# Patient Record
Sex: Female | Born: 1975 | Race: White | Hispanic: No
Health system: Southern US, Academic
[De-identification: ages and names within clinical notes are randomized; demographics above are authoritative.]

## PROBLEM LIST (undated history)

## (undated) ENCOUNTER — Ambulatory Visit: Payer: MEDICARE

## (undated) ENCOUNTER — Encounter

## (undated) ENCOUNTER — Telehealth

## (undated) ENCOUNTER — Ambulatory Visit

## (undated) ENCOUNTER — Encounter: Attending: Hematology & Oncology | Primary: Hematology & Oncology

## (undated) ENCOUNTER — Encounter: Attending: Geriatric Medicine | Primary: Geriatric Medicine

## (undated) ENCOUNTER — Telehealth: Attending: Hematology & Oncology | Primary: Hematology & Oncology

## (undated) ENCOUNTER — Non-Acute Institutional Stay: Payer: MEDICARE

## (undated) ENCOUNTER — Encounter: Attending: Clinical | Primary: Clinical

## (undated) ENCOUNTER — Encounter: Payer: MEDICARE | Attending: Clinical | Primary: Clinical

## (undated) ENCOUNTER — Ambulatory Visit: Payer: MEDICARE | Attending: Family | Primary: Family

## (undated) ENCOUNTER — Ambulatory Visit: Payer: MEDICARE | Attending: Hematology & Oncology | Primary: Hematology & Oncology

## (undated) ENCOUNTER — Encounter: Payer: MEDICARE | Attending: Hematology & Oncology | Primary: Hematology & Oncology

## (undated) DIAGNOSIS — C50912 Malignant neoplasm of unspecified site of left female breast: Secondary | ICD-10-CM

## (undated) DIAGNOSIS — N870 Mild cervical dysplasia: Secondary | ICD-10-CM

## (undated) DIAGNOSIS — C50919 Malignant neoplasm of unspecified site of unspecified female breast: Secondary | ICD-10-CM

## (undated) DIAGNOSIS — F430 Acute stress reaction: Secondary | ICD-10-CM

## (undated) DIAGNOSIS — R519 Headache, unspecified: Secondary | ICD-10-CM

## (undated) DIAGNOSIS — C801 Malignant (primary) neoplasm, unspecified: Secondary | ICD-10-CM

## (undated) DIAGNOSIS — B360 Pityriasis versicolor: Secondary | ICD-10-CM

## (undated) DIAGNOSIS — R51 Headache: Secondary | ICD-10-CM

## (undated) DIAGNOSIS — F410 Panic disorder [episodic paroxysmal anxiety] without agoraphobia: Secondary | ICD-10-CM

## (undated) DIAGNOSIS — R404 Transient alteration of awareness: Secondary | ICD-10-CM

## (undated) DIAGNOSIS — K37 Unspecified appendicitis: Secondary | ICD-10-CM

## (undated) DIAGNOSIS — F32A Depression, unspecified: Secondary | ICD-10-CM

## (undated) DIAGNOSIS — N390 Urinary tract infection, site not specified: Secondary | ICD-10-CM

## (undated) DIAGNOSIS — Z8614 Personal history of Methicillin resistant Staphylococcus aureus infection: Secondary | ICD-10-CM

## (undated) DIAGNOSIS — R7302 Impaired glucose tolerance (oral): Secondary | ICD-10-CM

## (undated) DIAGNOSIS — F329 Major depressive disorder, single episode, unspecified: Secondary | ICD-10-CM

## (undated) DIAGNOSIS — R110 Nausea: Secondary | ICD-10-CM

## (undated) DIAGNOSIS — M545 Low back pain: Secondary | ICD-10-CM

## (undated) DIAGNOSIS — N63 Unspecified lump in unspecified breast: Secondary | ICD-10-CM

## (undated) DIAGNOSIS — K3589 Other acute appendicitis without perforation or gangrene: Secondary | ICD-10-CM

## (undated) DIAGNOSIS — K353 Acute appendicitis with localized peritonitis, without perforation or gangrene: Secondary | ICD-10-CM

## (undated) DIAGNOSIS — Z8742 Personal history of other diseases of the female genital tract: Secondary | ICD-10-CM

## (undated) DIAGNOSIS — R55 Syncope and collapse: Secondary | ICD-10-CM

## (undated) DIAGNOSIS — R001 Bradycardia, unspecified: Secondary | ICD-10-CM

## (undated) DIAGNOSIS — K7581 Nonalcoholic steatohepatitis (NASH): Secondary | ICD-10-CM

## (undated) DIAGNOSIS — G8929 Other chronic pain: Secondary | ICD-10-CM

## (undated) DIAGNOSIS — F332 Major depressive disorder, recurrent severe without psychotic features: Secondary | ICD-10-CM

## (undated) DIAGNOSIS — F3341 Major depressive disorder, recurrent, in partial remission: Secondary | ICD-10-CM

## (undated) DIAGNOSIS — E282 Polycystic ovarian syndrome: Secondary | ICD-10-CM

## (undated) HISTORY — DX: Malignant (primary) neoplasm, unspecified: C80.1

## (undated) HISTORY — DX: Other acute appendicitis without perforation or gangrene: K35.890

## (undated) HISTORY — DX: Personal history of other diseases of the female genital tract: Z87.42

## (undated) HISTORY — DX: Malignant neoplasm of unspecified site of unspecified female breast: C50.919

## (undated) HISTORY — DX: Major depressive disorder, recurrent, in partial remission: F33.41

## (undated) HISTORY — DX: Unspecified appendicitis: K37

## (undated) HISTORY — DX: Other chronic pain: G89.29

## (undated) HISTORY — DX: Mild cervical dysplasia: N87.0

## (undated) HISTORY — DX: Transient alteration of awareness: R40.4

## (undated) HISTORY — DX: Nonalcoholic steatohepatitis (NASH): K75.81

## (undated) HISTORY — DX: Polycystic ovarian syndrome: E28.2

## (undated) HISTORY — DX: Urinary tract infection, site not specified: N39.0

## (undated) HISTORY — DX: Headache: R51

## (undated) HISTORY — DX: Major depressive disorder, recurrent severe without psychotic features: F33.2

## (undated) HISTORY — DX: Impaired glucose tolerance (oral): R73.02

## (undated) HISTORY — DX: Unspecified lump in unspecified breast: N63.0

## (undated) HISTORY — DX: Syncope and collapse: R55

## (undated) HISTORY — DX: Depression, unspecified: F32.A

## (undated) HISTORY — DX: Nausea: R11.0

## (undated) HISTORY — DX: Bradycardia, unspecified: R00.1

## (undated) HISTORY — DX: Panic disorder (episodic paroxysmal anxiety): F41.0

## (undated) HISTORY — DX: Acute stress reaction: F43.0

## (undated) HISTORY — DX: Pityriasis versicolor: B36.0

## (undated) HISTORY — DX: Low back pain: M54.5

## (undated) HISTORY — DX: Acute appendicitis with localized peritonitis, without perforation or gangrene: K35.30

## (undated) HISTORY — DX: Major depressive disorder, single episode, unspecified: F32.9

## (undated) HISTORY — DX: Personal history of Methicillin resistant Staphylococcus aureus infection: Z86.14

## (undated) HISTORY — DX: Headache, unspecified: R51.9

## (undated) HISTORY — DX: Malignant neoplasm of unspecified site of left female breast: C50.912

## (undated) HISTORY — PX: ABDOMINAL HYSTERECTOMY: SHX81

---
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
## 2005-02-22 ENCOUNTER — Emergency Department: Payer: Self-pay | Admitting: Emergency Medicine

## 2005-05-17 ENCOUNTER — Emergency Department: Payer: Self-pay | Admitting: Emergency Medicine

## 2005-07-10 ENCOUNTER — Emergency Department: Payer: Self-pay | Admitting: Unknown Physician Specialty

## 2005-08-10 ENCOUNTER — Emergency Department: Payer: Self-pay | Admitting: Emergency Medicine

## 2005-10-12 ENCOUNTER — Ambulatory Visit: Payer: Self-pay | Admitting: Obstetrics & Gynecology

## 2005-11-01 ENCOUNTER — Ambulatory Visit: Payer: Self-pay | Admitting: Obstetrics & Gynecology

## 2005-11-04 ENCOUNTER — Ambulatory Visit: Payer: Self-pay | Admitting: Obstetrics & Gynecology

## 2005-11-23 ENCOUNTER — Observation Stay: Payer: Self-pay

## 2005-12-05 ENCOUNTER — Ambulatory Visit: Payer: Self-pay | Admitting: Obstetrics & Gynecology

## 2006-01-01 ENCOUNTER — Emergency Department: Payer: Self-pay | Admitting: Emergency Medicine

## 2006-02-04 ENCOUNTER — Observation Stay: Payer: Self-pay | Admitting: Unknown Physician Specialty

## 2006-02-04 ENCOUNTER — Observation Stay: Payer: Self-pay

## 2006-02-23 ENCOUNTER — Observation Stay: Payer: Self-pay | Admitting: Obstetrics & Gynecology

## 2006-03-06 ENCOUNTER — Observation Stay: Payer: Self-pay

## 2006-03-10 ENCOUNTER — Observation Stay: Payer: Self-pay

## 2006-03-13 ENCOUNTER — Inpatient Hospital Stay: Payer: Self-pay

## 2006-08-08 ENCOUNTER — Emergency Department: Payer: Self-pay | Admitting: Emergency Medicine

## 2006-10-15 ENCOUNTER — Emergency Department: Payer: Self-pay | Admitting: Emergency Medicine

## 2006-10-16 ENCOUNTER — Emergency Department: Payer: Self-pay | Admitting: Internal Medicine

## 2006-10-17 ENCOUNTER — Emergency Department: Payer: Self-pay | Admitting: Internal Medicine

## 2008-03-23 ENCOUNTER — Emergency Department (HOSPITAL_COMMUNITY): Admission: EM | Admit: 2008-03-23 | Discharge: 2008-03-23 | Payer: Self-pay | Admitting: Emergency Medicine

## 2008-08-10 ENCOUNTER — Emergency Department (HOSPITAL_COMMUNITY): Admission: EM | Admit: 2008-08-10 | Discharge: 2008-08-11 | Payer: Self-pay | Admitting: Emergency Medicine

## 2008-10-06 ENCOUNTER — Emergency Department (HOSPITAL_COMMUNITY): Admission: EM | Admit: 2008-10-06 | Discharge: 2008-10-06 | Payer: Self-pay | Admitting: Emergency Medicine

## 2008-11-05 ENCOUNTER — Encounter: Admission: RE | Admit: 2008-11-05 | Discharge: 2008-11-05 | Payer: Self-pay | Admitting: Family Medicine

## 2008-11-16 ENCOUNTER — Emergency Department (HOSPITAL_COMMUNITY): Admission: EM | Admit: 2008-11-16 | Discharge: 2008-11-16 | Payer: Self-pay | Admitting: Emergency Medicine

## 2009-02-06 ENCOUNTER — Emergency Department (HOSPITAL_COMMUNITY): Admission: EM | Admit: 2009-02-06 | Discharge: 2009-02-07 | Payer: Self-pay | Admitting: Emergency Medicine

## 2009-02-08 ENCOUNTER — Emergency Department (HOSPITAL_COMMUNITY): Admission: EM | Admit: 2009-02-08 | Discharge: 2009-02-08 | Payer: Self-pay | Admitting: Emergency Medicine

## 2009-06-11 ENCOUNTER — Emergency Department (HOSPITAL_COMMUNITY): Admission: EM | Admit: 2009-06-11 | Discharge: 2009-06-11 | Payer: Self-pay | Admitting: Emergency Medicine

## 2010-11-12 ENCOUNTER — Ambulatory Visit: Payer: Self-pay | Admitting: Obstetrics and Gynecology

## 2010-11-16 ENCOUNTER — Ambulatory Visit: Payer: Self-pay | Admitting: Obstetrics and Gynecology

## 2011-03-17 LAB — URINALYSIS, ROUTINE W REFLEX MICROSCOPIC
Glucose, UA: NEGATIVE mg/dL
Ketones, ur: NEGATIVE mg/dL
Nitrite: NEGATIVE
Specific Gravity, Urine: 1.016 (ref 1.005–1.030)
pH: 6 (ref 5.0–8.0)

## 2011-03-17 LAB — BASIC METABOLIC PANEL
BUN: 6 mg/dL (ref 6–23)
Calcium: 8.2 mg/dL — ABNORMAL LOW (ref 8.4–10.5)
GFR calc non Af Amer: >60 mL/min (ref >60)
Potassium: 3.3 mEq/L — ABNORMAL LOW (ref 3.5–5.1)
Sodium: 138 mEq/L (ref 135–145)

## 2011-03-17 LAB — POCT PREGNANCY, URINE
Preg Test, Ur: NEGATIVE
Preg Test, Ur: NEGATIVE

## 2011-03-17 LAB — DIFFERENTIAL
Basophils Absolute: 0 10*3/uL (ref 0.0–0.1)
Eosinophils Relative: 8 % — ABNORMAL HIGH (ref 0–5)
Lymphocytes Relative: 37 % (ref 12–46)
Lymphs Abs: 1.7 10*3/uL (ref 0.7–4.0)
Neutro Abs: 2.1 10*3/uL (ref 1.7–7.7)

## 2011-03-17 LAB — CBC
HCT: 40.3 % (ref 36.0–46.0)
Hemoglobin: 14.1 g/dL (ref 12.0–15.0)
Platelets: 164 10*3/uL (ref 150–400)
RDW: 13.5 % (ref 11.5–15.5)
WBC: 4.6 10*3/uL (ref 4.0–10.5)

## 2011-08-30 LAB — POCT PREGNANCY, URINE
Operator id: 294511
Preg Test, Ur: NEGATIVE

## 2011-09-07 LAB — CBC
HCT: 40.4
Platelets: 219
RDW: 13.6
WBC: 9.4

## 2011-09-07 LAB — DIFFERENTIAL
Basophils Absolute: 0.1
Eosinophils Relative: 6 — ABNORMAL HIGH
Lymphocytes Relative: 38
Lymphs Abs: 3.5
Neutro Abs: 4.5
Neutrophils Relative %: 48

## 2011-09-07 LAB — BASIC METABOLIC PANEL
BUN: 8
Calcium: 8.8
Creatinine, Ser: 0.57
GFR calc non Af Amer: >60
Glucose, Bld: 92

## 2011-09-07 LAB — POCT I-STAT, CHEM 8
BUN: 8
Hemoglobin: 13.3
Potassium: 3.6
Sodium: 138
TCO2: 26

## 2011-09-09 LAB — HEPATIC FUNCTION PANEL
ALT: 46 U/L — ABNORMAL HIGH (ref 0–35)
AST: 33 U/L (ref 0–37)
Alkaline Phosphatase: 77 U/L (ref 39–117)
Bilirubin, Direct: 0.2 mg/dL (ref 0.0–0.3)
Total Bilirubin: 0.7 mg/dL (ref 0.3–1.2)

## 2011-09-09 LAB — BASIC METABOLIC PANEL
CO2: 24 mEq/L (ref 19–32)
Calcium: 8.6 mg/dL (ref 8.4–10.5)
GFR calc Af Amer: >60 mL/min (ref >60)
GFR calc non Af Amer: >60 mL/min (ref >60)
Potassium: 3.5 mEq/L (ref 3.5–5.1)
Sodium: 139 mEq/L (ref 135–145)

## 2011-09-09 LAB — URINALYSIS, ROUTINE W REFLEX MICROSCOPIC
Glucose, UA: NEGATIVE mg/dL
Hgb urine dipstick: NEGATIVE
Specific Gravity, Urine: 1.007 (ref 1.005–1.030)
Urobilinogen, UA: 0.2 mg/dL (ref 0.0–1.0)
pH: 6 (ref 5.0–8.0)

## 2011-09-09 LAB — DIFFERENTIAL
Lymphocytes Relative: 40 % (ref 12–46)
Monocytes Absolute: 0.6 10*3/uL (ref 0.1–1.0)
Monocytes Relative: 7 % (ref 3–12)
Neutro Abs: 4 10*3/uL (ref 1.7–7.7)

## 2011-09-09 LAB — PREGNANCY, URINE: Preg Test, Ur: NEGATIVE

## 2011-09-09 LAB — CBC
HCT: 39.3 % (ref 36.0–46.0)
Hemoglobin: 13.3 g/dL (ref 12.0–15.0)
RBC: 4.36 MIL/uL (ref 3.87–5.11)

## 2011-09-09 LAB — URINE MICROSCOPIC-ADD ON

## 2012-05-03 ENCOUNTER — Ambulatory Visit: Payer: Self-pay | Admitting: Family Medicine

## 2012-12-04 ENCOUNTER — Emergency Department: Payer: Self-pay | Admitting: Emergency Medicine

## 2012-12-04 LAB — BASIC METABOLIC PANEL
Calcium, Total: 8.9 mg/dL (ref 8.5–10.1)
Chloride: 107 mmol/L (ref 98–107)
Co2: 27 mmol/L (ref 21–32)
Creatinine: 0.7 mg/dL (ref 0.60–1.30)
Sodium: 140 mmol/L (ref 136–145)

## 2012-12-04 LAB — CBC
HCT: 41.3 % (ref 35.0–47.0)
MCHC: 34.3 g/dL (ref 32.0–36.0)
MCV: 91 fL (ref 80–100)
RDW: 13 % (ref 11.5–14.5)

## 2012-12-04 LAB — CK TOTAL AND CKMB (NOT AT ARMC): CK, Total: 37 U/L (ref 21–215)

## 2013-11-25 DIAGNOSIS — N63 Unspecified lump in unspecified breast: Secondary | ICD-10-CM | POA: Insufficient documentation

## 2013-11-25 HISTORY — DX: Unspecified lump in unspecified breast: N63.0

## 2013-12-11 DIAGNOSIS — C50919 Malignant neoplasm of unspecified site of unspecified female breast: Secondary | ICD-10-CM

## 2013-12-11 HISTORY — DX: Malignant neoplasm of unspecified site of unspecified female breast: C50.919

## 2013-12-24 ENCOUNTER — Emergency Department: Payer: Self-pay | Admitting: Psychiatry

## 2013-12-24 LAB — COMPREHENSIVE METABOLIC PANEL
Albumin: 4.4 g/dL (ref 3.4–5.0)
Alkaline Phosphatase: 64 U/L
Anion Gap: 10 (ref 7–16)
BILIRUBIN TOTAL: 0.9 mg/dL (ref 0.2–1.0)
BUN: 10 mg/dL (ref 7–18)
Calcium, Total: 9.5 mg/dL (ref 8.5–10.1)
Chloride: 106 mmol/L (ref 98–107)
Co2: 23 mmol/L (ref 21–32)
Creatinine: 0.79 mg/dL (ref 0.60–1.30)
EGFR (African American): >60
EGFR (Non-African Amer.): >60
Glucose: 121 mg/dL — ABNORMAL HIGH (ref 65–99)
Osmolality: 278 (ref 275–301)
Potassium: 4.6 mmol/L (ref 3.5–5.1)
SGOT(AST): 46 U/L — ABNORMAL HIGH (ref 15–37)
SGPT (ALT): 33 U/L (ref 12–78)
Sodium: 139 mmol/L (ref 136–145)
Total Protein: 7.7 g/dL (ref 6.4–8.2)

## 2013-12-24 LAB — URINALYSIS, COMPLETE
BILIRUBIN, UR: NEGATIVE
Bacteria: NONE SEEN
Glucose,UR: NEGATIVE mg/dL (ref 0–75)
Leukocyte Esterase: NEGATIVE
Nitrite: NEGATIVE
PH: 9 (ref 4.5–8.0)
Protein: NEGATIVE
SPECIFIC GRAVITY: 1.009 (ref 1.003–1.030)
Squamous Epithelial: 1
WBC UR: 2 /HPF (ref 0–5)

## 2013-12-24 LAB — LIPASE, BLOOD: LIPASE: 90 U/L (ref 73–393)

## 2013-12-24 LAB — CBC
HCT: 43.8 % (ref 35.0–47.0)
HGB: 14.8 g/dL (ref 12.0–16.0)
MCH: 30.8 pg (ref 26.0–34.0)
MCHC: 33.8 g/dL (ref 32.0–36.0)
MCV: 91 fL (ref 80–100)
Platelet: 200 10*3/uL (ref 150–440)
RBC: 4.82 10*6/uL (ref 3.80–5.20)
RDW: 13.2 % (ref 11.5–14.5)
WBC: 12.1 10*3/uL — ABNORMAL HIGH (ref 3.6–11.0)

## 2013-12-31 HISTORY — PX: MASTECTOMY: SHX3

## 2014-03-10 ENCOUNTER — Inpatient Hospital Stay: Payer: Self-pay | Admitting: Internal Medicine

## 2014-03-10 LAB — CBC WITH DIFFERENTIAL/PLATELET
Bands: 6 %
COMMENT - H1-COM1: NORMAL
Comment - H1-Com2: NORMAL
EOS PCT: 4 %
HCT: 37.1 % (ref 35.0–47.0)
HGB: 12.7 g/dL (ref 12.0–16.0)
LYMPHS PCT: 11 %
MCH: 31 pg (ref 26.0–34.0)
MCHC: 34.3 g/dL (ref 32.0–36.0)
MCV: 90 fL (ref 80–100)
Monocytes: 8 %
Platelet: 132 10*3/uL — ABNORMAL LOW (ref 150–440)
RBC: 4.1 10*6/uL (ref 3.80–5.20)
RDW: 13.4 % (ref 11.5–14.5)
SEGMENTED NEUTROPHILS: 71 %
WBC: 12.8 10*3/uL — ABNORMAL HIGH (ref 3.6–11.0)

## 2014-03-10 LAB — COMPREHENSIVE METABOLIC PANEL
ALK PHOS: 74 U/L
ALT: 45 U/L (ref 12–78)
ANION GAP: 9 (ref 7–16)
Albumin: 3.4 g/dL (ref 3.4–5.0)
BUN: 16 mg/dL (ref 7–18)
Bilirubin,Total: 0.6 mg/dL (ref 0.2–1.0)
CALCIUM: 8.4 mg/dL — AB (ref 8.5–10.1)
CHLORIDE: 105 mmol/L (ref 98–107)
CO2: 25 mmol/L (ref 21–32)
Creatinine: 0.69 mg/dL (ref 0.60–1.30)
EGFR (African American): >60
EGFR (Non-African Amer.): >60
GLUCOSE: 82 mg/dL (ref 65–99)
Osmolality: 278 (ref 275–301)
POTASSIUM: 3.1 mmol/L — AB (ref 3.5–5.1)
SGOT(AST): 15 U/L (ref 15–37)
Sodium: 139 mmol/L (ref 136–145)
Total Protein: 6.2 g/dL — ABNORMAL LOW (ref 6.4–8.2)

## 2014-03-10 LAB — LIPASE, BLOOD: LIPASE: 99 U/L (ref 73–393)

## 2014-03-11 LAB — BASIC METABOLIC PANEL
ANION GAP: 6 — AB (ref 7–16)
BUN: 16 mg/dL (ref 7–18)
CALCIUM: 8 mg/dL — AB (ref 8.5–10.1)
CO2: 25 mmol/L (ref 21–32)
Chloride: 108 mmol/L — ABNORMAL HIGH (ref 98–107)
Creatinine: 0.63 mg/dL (ref 0.60–1.30)
EGFR (Non-African Amer.): >60
GLUCOSE: 123 mg/dL — AB (ref 65–99)
Osmolality: 280 (ref 275–301)
Potassium: 3.6 mmol/L (ref 3.5–5.1)
Sodium: 139 mmol/L (ref 136–145)

## 2015-03-28 NOTE — Discharge Summary (Signed)
PATIENT NAME:  Kimberly Key, DEWBERRY MR#:  [id number] DATE OF BIRTH:  22-Jun-1976  DATE OF ADMISSION:  03/10/2014 DATE OF DISCHARGE:  03/13/2014  PRESENTING COMPLAINT: Nausea and vomiting.  PRIMARY CARE PHYSICIAN: Dr. Lelon Huh   DISCHARGE DIAGNOSES:  1.  Intractable nausea and vomiting, improved, secondary to constipation. 2.  History of breast cancer, status post chemotherapy.   CODE STATUS: FULL code.   DISCHARGE MEDICATIONS: 1.  Compazine 10 mg 3 times a day. 2.  Tramadol 50 mg every 4 hours as needed.  3.  Lorazepam 1 mg t.i.d.  4.  Docusate 100 mg 2 capsules b.i.d. 5.  MiraLax 17 grams orally daily as needed. 6.  Metoclopramide 10 mg 1 tablet 4 times a day.  7.  Phenadoz 12.5 mg rectal suppository as needed.   DISCHARGE DIET: Regular, liquid, advance as tolerated.   DISCHARGE FOLLOWUP: Follow up with Dr. Lonia Chimera, oncology, on your scheduled appointment. Follow up with Dr. Caryn Section as outpatient as well.   BRIEF SUMMARY OF HOSPITAL COURSE: [name] is a 39 year old Caucasian female with history of cervical, uterine, and breast cancer who came to the hospital due to intractable nausea, vomiting, and feeling restless. She currently is getting treatment for breast cancer actively. She was admitted with:  1.  Intractable nausea and vomiting which was likely related to constipation. She had chemotherapy about 4 to 5 days ago. It could be due to that, however, in discussing with her oncologist, Dr. Lonia Chimera, it was less likely. The patient did have significant constipation noted on KUB. She was given some enemas, started on Reglan, including Compazine and Decadron was also given for vomiting. She was tolerating full liquid diet prior to discharge. X-ray KUB did not show any obstruction but did show some constipation.  2.  Hyperkalemia, resolved.  3.  Leukocytosis, likely secondary to stress margination from nausea and vomiting. 4.  Constipation, improving. The patient did have several  BMs prior to discharge.   Hospital stay otherwise remained stable. She will follow up with her primary oncologist on her scheduled appointment.  TIME SPENT: 40 minutes. ____________________________ Hart Rochester Posey Pronto, MD sap:sb D: 03/18/2014 11:34:10 ET T: 03/18/2014 12:09:33 ET JOB#: [id number]  cc: Javari Bufkin A. Posey Pronto, MD, <Dictator> Kirstie Peri. Caryn Section, MD Ilda Basset MD ELECTRONICALLY SIGNED 03/31/2014 9:06

## 2015-03-28 NOTE — H&P (Signed)
PATIENT NAME:  Kimberly Key, GROUNDS MR#:  [id number] DATE OF BIRTH:  1975/12/29  DATE OF ADMISSION:  03/10/2014  PRIMARY CARE PHYSICIAN:  Dr. Lelon Huh.   ONCOLOGIST: At [facility]   CHIEF COMPLAINT: Nausea, vomiting.   HISTORY OF PRESENT ILLNESS: This is a 39 year old female who presented to the Emergency Room due to intractable nausea and vomiting that began this morning. The patient says that she received chemo for her breast cancer last Thursday. She was doing well until earlier this morning at 4:00; she started having multiple episodes of nausea and vomiting. She attempted to take her Ativan, and also take some Zofran, but it did not improve her symptoms. She, therefore, came to the ER for further evaluation. In the Emergency Room, the patient received IV Ativan, multiple doses of IV Zofran and Phenergan, but continues to have significant nausea and vomiting. Hospitalist services were contacted for further treatment and evaluation.   REVIEW OF SYSTEMS:  CONSTITUTIONAL: No documented fever. No weight gain. No weight loss.  EYES: No blurred or double vision.  ENT: No tinnitus. No postnasal drip. No redness of the oropharynx.  RESPIRATORY: No cough. No wheeze. No hemoptysis. No dyspnea.  CARDIOVASCULAR: No chest pain. No orthopnea. No palpitations. No syncope.  GASTROINTESTINAL: Positive nausea. Positive vomiting. No diarrhea. No abdominal pain. No melena or hematochezia.  GENITOURINARY: No dysuria or hematuria.  ENDOCRINE: No polyuria or nocturia. No heat or cold intolerance.  HEMATOLOGIC: No anemia. No bruising. No bleeding.  INTEGUMENTARY: No rashes. No lesions.  MUSCULOSKELETAL: No arthritis. No swelling. No gout.  NEUROLOGIC: No numbness. No tingling. No ataxia. No seizure-type activity. PSYCHIATRIC: No anxiety. No insomnia. No ADD.   PAST MEDICAL HISTORY: Consistent with history of cervical cancer, uterine cancer and also breast cancer.   ALLERGIES: PENICILLIN AND PERCOCET. PENICILLIN  CAUSES RASH. PERCOCET CAUSES MENTAL STATUS CHANGES.   SOCIAL HISTORY: Does not smoke. No alcohol abuse. No illicit drug abuse. Lives by herself. She is separated.   FAMILY HISTORY: Mother and father both alive. There is a family history of diabetes and high blood pressure in the family.   CURRENT MEDICATIONS: Are as follows: Ativan 1 mg t.i.d., prochlorperazine 10 mg t.i.d. and tramadol 50 mg q.4 hours.   PHYSICAL EXAMINATION: Presently is as follows:  VITAL SIGNS:  Are noted to be temperature is 98.5, pulse 75, respirations 20, blood pressure 126/68, stats 98% on room air.  GENERAL: She is a anxious/agitated-appearing female in mild distress.  HEAD, EYES, EARS, NOSE, THROAT:  She is atraumatic, normocephalic. Extraocular muscles are intact. Pupils are equal and reactive to light. Sclerae anicteric. No conjunctival injection. No pharyngeal erythema.  NECK: Supple. There is no jugular venous distention. No bruits. No lymphadenopathy or thyromegaly.  HEART: Regular rate and rhythm. No murmurs. No rubs. No clicks.  LUNGS: Clear to auscultation bilaterally. No rales. No rhonchi. No wheezes.  ABDOMEN: Soft, flat, nontender, nondistended. Has good bowel sounds. No hepatosplenomegaly appreciated.  EXTREMITIES: No evidence of any cyanosis, clubbing or peripheral edema. Has +2 pedal and radial pulses bilaterally.  NEUROLOGIC: The patient is alert, awake and oriented x 3 with no focal motor or sensory deficits bilaterally.  SKIN: Moist and warm with no rashes appreciated.  LYMPHATIC: There is no cervical or axillary lymphadenopathy.   LABORATORY DATA: Showed a serum glucose of 82, BUN 16, creatinine 0.6, sodium 139, potassium 3.1, chloride 105, bicarb 25. LFTs within normal limits. White cell count 12.8, hemoglobin 12.7, hematocrit 37.1, platelet count of 132.  The patient did have a KUB done which showed diffuse stool throughout the colon. Bowel gas pattern overall unremarkable.   ASSESSMENT AND  PLAN: This is a 39 year old female with history of cervical cancer, uterine and breast cancer, presented to the hospital due to nausea, vomiting and feeling restless.  1.  Intractable nausea, vomiting. This is likely related to the chemotherapy she received last week. For now, admit her under observation, start her on IV fluids and supportive care with scheduled IV Zofran and start her on some IV Decadron. Keep her n.p.o. for now.  2.  Hypokalemia, likely due to the intractable nausea, vomiting, I will replace her potassium and follow up in the morning.  3.  Leukocytosis; suspect this is stress margination from her nausea, vomiting. We will follow her white cell count.  4.  Constipation. The patient's KUB shows a moderate amount of stool throughout the colon. For now, I will place her on lactulose b.i.d.   The patient is a FULL CODE.   TIME SPENT: 50 minutes.    ____________________________ Belia Heman. Verdell Carmine, MD vjs:dmm D: 03/10/2014 11:20:31 ET T: 03/10/2014 11:45:20 ET JOB#: [id number]  cc: Belia Heman. Verdell Carmine, MD, <Dictator> Henreitta Leber MD ELECTRONICALLY SIGNED 03/16/2014 18:48

## 2015-04-08 LAB — BASIC METABOLIC PANEL
BUN: 12 mg/dL (ref 4–21)
CREATININE: 0.6 mg/dL (ref 0.5–1.1)
GLUCOSE: 98 mg/dL
POTASSIUM: 4.6 mmol/L (ref 3.4–5.3)
SODIUM: 144 mmol/L (ref 137–147)

## 2015-04-08 LAB — LIPID PANEL
Cholesterol: 200 mg/dL (ref 0–200)
HDL: 47 mg/dL (ref 35–70)
LDL Cholesterol: 128 mg/dL
Triglycerides: 125 mg/dL (ref 40–160)

## 2015-05-07 DIAGNOSIS — F332 Major depressive disorder, recurrent severe without psychotic features: Secondary | ICD-10-CM | POA: Insufficient documentation

## 2015-05-07 DIAGNOSIS — F3341 Major depressive disorder, recurrent, in partial remission: Secondary | ICD-10-CM | POA: Insufficient documentation

## 2015-05-07 HISTORY — DX: Major depressive disorder, recurrent severe without psychotic features: F33.2

## 2015-05-07 HISTORY — DX: Major depressive disorder, recurrent, in partial remission: F33.41

## 2015-08-14 ENCOUNTER — Encounter: Payer: Self-pay | Admitting: Family Medicine

## 2015-08-14 ENCOUNTER — Ambulatory Visit (INDEPENDENT_AMBULATORY_CARE_PROVIDER_SITE_OTHER): Payer: Medicaid Other | Admitting: Family Medicine

## 2015-08-14 VITALS — BP 118/70 | HR 80 | Temp 98.6°F | Resp 20 | Ht 62.0 in | Wt 191.0 lb

## 2015-08-14 DIAGNOSIS — N3000 Acute cystitis without hematuria: Secondary | ICD-10-CM | POA: Diagnosis not present

## 2015-08-14 DIAGNOSIS — N390 Urinary tract infection, site not specified: Secondary | ICD-10-CM | POA: Insufficient documentation

## 2015-08-14 HISTORY — DX: Urinary tract infection, site not specified: N39.0

## 2015-08-14 MED ORDER — CIPROFLOXACIN HCL 500 MG PO TABS
500.0000 mg | ORAL_TABLET | Freq: Two times a day (BID) | ORAL | Status: AC
Start: 2015-08-14 — End: 2015-08-21

## 2015-08-14 MED ORDER — PHENAZOPYRIDINE HCL 100 MG PO TABS: 100.0000 mg | ORAL_TABLET | Freq: Three times a day (TID) | ORAL | Status: DC | PRN

## 2015-08-14 NOTE — Progress Notes (Signed)
Patient ID: Kimberly Key, female   DOB: [birth year], 39 y.o.   MRN: [id number]   Kimberly Key  MRN: [id number] DOB: Aug 25, 1976  Subjective:  Abdominal Pain This is a new problem. The current episode started in the past 7 days. The onset quality is sudden. The problem occurs constantly. The problem has been gradually worsening. The pain is located in the generalized abdominal region. The pain is at a severity of 8/10. The pain is moderate. The quality of the pain is aching, a sensation of fullness and cramping. Associated symptoms include dysuria, frequency and nausea. Pertinent negatives include no constipation, diarrhea, fever, hematuria or vomiting. She has tried acetaminophen for the symptoms. The treatment provided no relief.  Patient reports that she has lower abdominal pain that has been constant and worsening. Patient believes that it could be a UTI.   Patient Active Problem List   Diagnosis Date Noted  . Major depressive disorder, recurrent severe without psychotic features 05/07/2015  . Breast CA 12/11/2013  . Breast lump 11/25/2013    No past medical history on file.  Social History   Social History  . Marital Status: Legally Separated    Spouse Name: N/A  . Number of Children: N/A  . Years of Education: N/A   Occupational History  . Not on file.   Social History Main Topics  . Smoking status: Never Smoker   . Smokeless tobacco: Not on file  . Alcohol Use: No  . Drug Use: No  . Sexual Activity: Not on file   Other Topics Concern  . Not on file   Social History Narrative  . No narrative on file    No outpatient prescriptions prior to visit.   No facility-administered medications prior to visit.    Allergies  Allergen Reactions  . Latex Rash  . Oxycodone-Acetaminophen Anxiety and Rash    Dizzy;"mentally not right"; did not ease pain  . Penicillins Rash    Review of Systems  Constitutional: Negative for fever.  Gastrointestinal: Positive for nausea  and abdominal pain. Negative for vomiting, diarrhea and constipation.  Genitourinary: Positive for dysuria and frequency. Negative for hematuria.   Objective:  BP 118/70 mmHg  Pulse 80  Temp(Src) 98.6 F (37 C)  Resp 20  Ht 5\' 2"  (1.575 m)  Wt 191 lb (86.637 kg)  BMI 34.93 kg/m2  Physical Exam  General Appearance:    Alert, cooperative, no distress  Eyes:    PERRL, conjunctiva/corneas clear, EOM's intact       Lungs:     Clear to auscultation bilaterally, respirations unlabored  Heart:    Regular rate and rhythm  Abdomen:   Moderate suprapubic tenderness. No rebound or guarding. Marland Kitchen No CVA tenderness, No masses.    U/a large blood. No LE   Assessment and Plan :  1. Acute cystitis without hematuria  - ciprofloxacin (CIPRO) 500 MG tablet; Take 1 tablet (500 mg total) by mouth 2 (two) times daily.  Dispense: 14 tablet; Refill: 0 - phenazopyridine (PYRIDIUM) 100 MG tablet; Take 1 tablet (100 mg total) by mouth 3 (three) times daily as needed for pain.  Dispense: 10 tablet; Refill: 0 - Urine culture - POCT urinalysis dipstick  Call if symptoms change or if not rapidly improving.     [facility] 08/14/2015 3:46 PM

## 2015-08-16 LAB — URINE CULTURE

## 2015-08-26 ENCOUNTER — Telehealth: Payer: Self-pay | Admitting: *Deleted

## 2015-08-26 MED ORDER — FLUCONAZOLE 150 MG PO TABS: 150.0000 mg | ORAL_TABLET | Freq: Once | ORAL | Status: AC

## 2015-08-26 NOTE — Telephone Encounter (Signed)
-----   Message from Birdie Sons, MD sent at 08/17/2015  9:56 AM EDT ----- Urine culture shows e.coli infection sensitive to Cipro. Symptoms should be resolved by the time she is finished with antibiotic. Call otherwise.

## 2015-08-26 NOTE — Telephone Encounter (Signed)
Patient notified of results. Patient expressed understanding.  

## 2015-08-26 NOTE — Telephone Encounter (Signed)
Patient stated that UTI seems to be better, however she now has symptoms of yeast infection from antibiotic. Patient is requesting a medication for yeast infection be sent to [facility]?

## 2015-08-26 NOTE — Telephone Encounter (Signed)
Have sent rx for diflucan to [facility].

## 2015-12-30 ENCOUNTER — Encounter: Payer: Self-pay | Admitting: Family Medicine

## 2016-02-18 ENCOUNTER — Encounter: Payer: Self-pay | Admitting: Physician Assistant

## 2016-02-18 ENCOUNTER — Telehealth: Payer: Self-pay

## 2016-02-18 ENCOUNTER — Ambulatory Visit (INDEPENDENT_AMBULATORY_CARE_PROVIDER_SITE_OTHER): Payer: Medicaid Other | Admitting: Physician Assistant

## 2016-02-18 VITALS — BP 124/80 | HR 88 | Temp 98.1°F | Resp 16

## 2016-02-18 DIAGNOSIS — T7840XA Allergy, unspecified, initial encounter: Secondary | ICD-10-CM

## 2016-02-18 DIAGNOSIS — R300 Dysuria: Secondary | ICD-10-CM

## 2016-02-18 DIAGNOSIS — N390 Urinary tract infection, site not specified: Secondary | ICD-10-CM

## 2016-02-18 DIAGNOSIS — R82998 Other abnormal findings in urine: Secondary | ICD-10-CM

## 2016-02-18 DIAGNOSIS — L509 Urticaria, unspecified: Secondary | ICD-10-CM

## 2016-02-18 LAB — POCT URINALYSIS DIPSTICK
BILIRUBIN UA: NEGATIVE
GLUCOSE UA: NEGATIVE
Ketones, UA: NEGATIVE
NITRITE UA: NEGATIVE
Protein, UA: NEGATIVE
Spec Grav, UA: 1.01
Urobilinogen, UA: 0.2
pH, UA: 7.5

## 2016-02-18 MED ORDER — PREDNISONE 10 MG (21) PO TBPK: ORAL_TABLET | ORAL | Status: DC

## 2016-02-18 NOTE — Patient Instructions (Signed)

## 2016-02-18 NOTE — Telephone Encounter (Signed)
Patient had ov with Kimberly Key in at 11:15 today.

## 2016-02-18 NOTE — Progress Notes (Signed)
Patient ID: Kimberly Key, female   DOB: November 03, 1976, 40 y.o.   MRN: [id number]       Patient: Kimberly Key Female    DOB: 1976-08-31   40 y.o.   MRN: [id number] Visit Date: 02/18/2016  Today's Provider: Mar Daring, PA-C   Chief Complaint  Patient presents with  . Rash    X 1 week.    Subjective:    Rash This is a new problem. The current episode started in the past 7 days. The problem has been gradually worsening since onset. The affected locations include the abdomen. The rash is characterized by blistering, itchiness, pain and redness. She was exposed to nothing. Pertinent negatives include no fever or vomiting. Past treatments include anti-itch cream and antihistamine.  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  She called her medical oncologist who called her in hydroxyzine and advised her to discontinue using benadryl and benadryl cream.  She states the hydroxyzine does not control the itch as well as the benadryl did. She does have an appt with her surgeon, Dr. Juliann Mule, on Monday 02/22/16.     Allergies  Allergen Reactions  . Bupropion Hcl     lightheadness  . Cephalexin Itching  . Latex Rash  . Oxycodone-Acetaminophen Anxiety and Rash    Dizzy;"mentally not right"; did not ease pain  . Penicillins Rash  . Promethazine Rash    Tremors  . Venlafaxine Rash    Patient states she is only able to take Desvenlafaxine   Previous Medications   DESVENLAFAXINE (PRISTIQ) 100 MG 24 HR TABLET    Take 1 tablet by mouth daily.   EXEMESTANE (AROMASIN) 25 MG TABLET    Take 25 mg by mouth daily.   GABAPENTIN (NEURONTIN) 100 MG CAPSULE    Take 1 capsule by mouth 3 (three) times daily.   HYDROXYZINE (ATARAX/VISTARIL) 25 MG TABLET    Take 25 mg by mouth every 6 (six) hours.   PRISTIQ 100 MG 24 HR TABLET    Take 1 tablet by mouth daily.     Review of Systems  Constitutional: Negative for fever and chills.  HENT: Negative.   Respiratory: Negative.   Cardiovascular: Negative.   Gastrointestinal: Negative.  Negative for nausea, vomiting and abdominal pain.  Genitourinary: Negative.   Skin: Positive for rash. Negative for color change.    Social History  Substance Use Topics  . Smoking status: Never Smoker   . Smokeless tobacco: Not on file  . Alcohol Use: No   Objective:   BP 124/80 mmHg  Pulse 88  Temp(Src) 98.1 F (36.7 C)  Resp 16  Wt   SpO2 98%  Physical Exam  Constitutional: She appears well-developed and well-nourished. No distress.  Cardiovascular: Normal rate, regular rhythm and normal heart sounds.  Exam reveals no gallop and no friction rub.   No murmur heard. Pulmonary/Chest: Effort normal and breath sounds normal. No respiratory distress. She has no wheezes. She has no rales.  Abdominal: Soft. Bowel sounds are normal. She exhibits no distension and no mass. There is no tenderness. There is no rebound and no guarding.  Skin: Rash noted. Rash is papular. She is not diaphoretic.     Vitals reviewed.       Assessment & Plan:     1. Dysuria Patient states she had prolonged  catheterization following surgery and is prone to get UTIs.  She is not having many symptoms at this time but would like to have her urine checked for UTI due to her history. UA did have trace leukocytes but since she is asymptomatic will send urine for culture and f/u pending these results. Will treat if positive for bacteria. - POCT urinalysis dipstick  2. Leukocytes in urine See above medical treatment plan. - Urine Culture  3. Urticaria I do feel the rash is most likely secondary to the glue that was used to close the incision.  Will give prednisone as below. I did reach out to contact Dr. Maudie Mercury to discuss her case and left a VM.  I have not yet heard back from his office by this time, but she does have a f/u with him on  Monday 02/22/16.  I did advise her that if the rash spreads with initial dose she is to discontinue immediately. She is to call if symptoms worsen. - predniSONE (STERAPRED UNI-PAK 21 TAB) 10 MG (21) TBPK tablet; Take as directed on package directions  Dispense: 21 tablet; Refill: 0  4. Allergic reaction, initial encounter See above medical treatment plan. - predniSONE (STERAPRED UNI-PAK 21 TAB) 10 MG (21) TBPK tablet; Take as directed on package directions  Dispense: 21 tablet; Refill: 0       Mar Daring, PA-C  [facility]

## 2016-02-18 NOTE — Telephone Encounter (Signed)
Pt called c/o rash that is spreading s/p surgery. Pt spoke with Helene Kelp, there were no CMA's that could take her call except me. Pt was on hold while Helene Kelp was explaining the situation to me, and pt hung up. Tried calling pt back, with no answer. Helene Kelp states pt seemed concerned about the rash. Please attempt to call pt to triage rash and possibly schedule appointment. Thanks Renaldo Fiddler, CMA

## 2016-02-19 LAB — URINE CULTURE

## 2016-02-22 ENCOUNTER — Telehealth: Payer: Self-pay

## 2016-02-22 NOTE — Telephone Encounter (Signed)
-----   Message from Mar Daring, PA-C sent at 02/22/2016  8:56 AM EDT ----- Normal urine culture

## 2016-02-22 NOTE — Telephone Encounter (Signed)
No answer  Thanks,  -Kimberly Key 

## 2016-02-24 NOTE — Telephone Encounter (Signed)
Attempted to contact patient. No answer nor voicemail.  

## 2016-02-25 NOTE — Telephone Encounter (Signed)
No answer nor voicemail. 

## 2016-02-26 NOTE — Telephone Encounter (Signed)
No answer/unavailable  Thanks,  -Joseline

## 2016-02-29 NOTE — Telephone Encounter (Signed)
Patient advised as below.  

## 2016-05-04 DIAGNOSIS — E282 Polycystic ovarian syndrome: Secondary | ICD-10-CM

## 2016-05-04 DIAGNOSIS — Z8614 Personal history of Methicillin resistant Staphylococcus aureus infection: Secondary | ICD-10-CM

## 2016-05-04 DIAGNOSIS — R7302 Impaired glucose tolerance (oral): Secondary | ICD-10-CM | POA: Insufficient documentation

## 2016-05-04 DIAGNOSIS — K7581 Nonalcoholic steatohepatitis (NASH): Secondary | ICD-10-CM | POA: Insufficient documentation

## 2016-05-04 DIAGNOSIS — G8929 Other chronic pain: Secondary | ICD-10-CM | POA: Insufficient documentation

## 2016-05-04 DIAGNOSIS — R001 Bradycardia, unspecified: Secondary | ICD-10-CM

## 2016-05-04 DIAGNOSIS — B360 Pityriasis versicolor: Secondary | ICD-10-CM | POA: Insufficient documentation

## 2016-05-04 DIAGNOSIS — R51 Headache: Secondary | ICD-10-CM

## 2016-05-04 DIAGNOSIS — N870 Mild cervical dysplasia: Secondary | ICD-10-CM | POA: Insufficient documentation

## 2016-05-04 DIAGNOSIS — F410 Panic disorder [episodic paroxysmal anxiety] without agoraphobia: Secondary | ICD-10-CM

## 2016-05-04 DIAGNOSIS — F430 Acute stress reaction: Secondary | ICD-10-CM

## 2016-05-04 DIAGNOSIS — C50912 Malignant neoplasm of unspecified site of left female breast: Secondary | ICD-10-CM

## 2016-05-04 DIAGNOSIS — R519 Headache, unspecified: Secondary | ICD-10-CM | POA: Insufficient documentation

## 2016-05-04 HISTORY — DX: Personal history of Methicillin resistant Staphylococcus aureus infection: Z86.14

## 2016-05-04 HISTORY — DX: Malignant neoplasm of unspecified site of left female breast: C50.912

## 2016-05-04 HISTORY — DX: Pityriasis versicolor: B36.0

## 2016-05-04 HISTORY — DX: Acute stress reaction: F43.0

## 2016-05-04 HISTORY — DX: Polycystic ovarian syndrome: E28.2

## 2016-05-04 HISTORY — DX: Bradycardia, unspecified: R00.1

## 2016-05-04 HISTORY — DX: Panic disorder (episodic paroxysmal anxiety): F41.0

## 2016-05-13 ENCOUNTER — Ambulatory Visit (INDEPENDENT_AMBULATORY_CARE_PROVIDER_SITE_OTHER): Payer: Medicaid Other | Admitting: Family Medicine

## 2016-05-13 ENCOUNTER — Encounter: Payer: Self-pay | Admitting: Family Medicine

## 2016-05-13 VITALS — BP 112/82 | HR 80 | Temp 97.7°F | Resp 16 | Wt 195.0 lb

## 2016-05-13 DIAGNOSIS — R110 Nausea: Secondary | ICD-10-CM | POA: Insufficient documentation

## 2016-05-13 DIAGNOSIS — M545 Low back pain, unspecified: Secondary | ICD-10-CM | POA: Insufficient documentation

## 2016-05-13 DIAGNOSIS — R55 Syncope and collapse: Secondary | ICD-10-CM | POA: Diagnosis not present

## 2016-05-13 DIAGNOSIS — C50912 Malignant neoplasm of unspecified site of left female breast: Secondary | ICD-10-CM

## 2016-05-13 DIAGNOSIS — L304 Erythema intertrigo: Secondary | ICD-10-CM | POA: Diagnosis not present

## 2016-05-13 HISTORY — DX: Low back pain, unspecified: M54.50

## 2016-05-13 HISTORY — DX: Nausea: R11.0

## 2016-05-13 HISTORY — DX: Syncope and collapse: R55

## 2016-05-13 MED ORDER — KETOCONAZOLE 2 % EX CREA: 1.0000 "application " | TOPICAL_CREAM | Freq: Every day | CUTANEOUS | Status: DC

## 2016-05-13 MED ORDER — ONDANSETRON HCL 4 MG PO TABS: 4.0000 mg | ORAL_TABLET | Freq: Three times a day (TID) | ORAL | Status: DC | PRN

## 2016-05-13 NOTE — Patient Instructions (Addendum)
   Take OTC Senokot, 2 tablets up to three times a day to prevent constipation.   Start taking gabapentin on schedule three times every day, you can take 2 capsules before bedtime.

## 2016-05-13 NOTE — Progress Notes (Signed)
Patient: Kimberly Key Female    DOB: 27-Oct-1976   40 y.o.   MRN: [id number] Visit Date: 05/13/2016  Today's Provider: Lelon Huh, MD   Chief Complaint  Patient presents with  . Nausea  . Rash   Subjective:    HPI Nausea: Patient presents today complaining of chronic nausea that has occurred intermittently for the past few years. Patient states her Oncologist has tried changing her medications to reduce nausea, but patient has still been having nausea symptoms.  Sometimes nausea improves when patient sneezes. States took compazine and Zofran in the past which caused severe constipation. States she also took Reglan which made her want to eat all the time.   Rash: Patient has had a red itchy rash in her right inguinal area for the past week. Patient has been using Nystatin cream on the rash which has helped. Patient also has a red rash on her right shoulder. Patient states the rash on her shoulder is not itchy. Patient reports having white bumps on her waist line that come and go.  Syncopal episodes: Patient has been seeing her Cardiologist (Dr. Charletta Cousin with [facility]) for Syncopal episodes. Per patient  DR. Charletta Cousin has ruled out cardiac problems that could be causing syncope. She had event monitor 2/10 through 3/16 which was negative.    She did have MRI at [facility] 01/19/2016 finding stable nonspecific T2/FLAIR hyperintensities in right frontal lobs. Dr. Charletta Cousin is recommending patien see a Neurologist. Patient need referral to see a Neurologist.   Pain She has persistent left rib and back back pain for which she was prescribed gabapentin. She has only been taking prn and states it makes her very fatigued.     Allergies  Allergen Reactions  . Bupropion Hcl     lightheadness  . Cephalexin Itching  . Latex Rash  . Oxycodone-Acetaminophen Anxiety and Rash    Dizzy;"mentally not right"; did not ease pain  . Penicillins Rash  . Promethazine Rash    Tremors  . Venlafaxine Rash    Patient  states she is only able to take Desvenlafaxine   Current Meds  Medication Sig  . Calcium-Magnesium-Zinc 333-133-5 MG TABS Take 2 tablets by mouth daily.  . cyanocobalamin 1000 MCG tablet Take 1,000 mcg by mouth daily.  . Denosumab (XGEVA [place]) Inject 1 Dose into the skin. Every 45 days  . desvenlafaxine (PRISTIQ) 100 MG 24 hr tablet Take 1 tablet by mouth daily.  . fulvestrant (FASLODEX) 250 MG/5ML injection Inject 250 mg into the muscle every 30 (thirty) days. One injection each buttock over 1-2 minutes. Warm prior to use.  . gabapentin (NEURONTIN) 100 MG capsule Take 1 capsule by mouth 3 (three) times daily.  Marland Kitchen leuprolide (LUPRON) 3.75 MG injection Inject 3.75 mg into the muscle every 30 (thirty) days.  Marland Kitchen omeprazole (PRILOSEC) 20 MG capsule Take 20 mg by mouth daily.    Review of Systems  Constitutional: Negative for fever, chills, appetite change and fatigue.  Respiratory: Negative for chest tightness and shortness of breath.   Cardiovascular: Negative for chest pain and palpitations.  Gastrointestinal: Positive for nausea and vomiting. Negative for abdominal pain.  Skin: Positive for rash.  Neurological: Positive for syncope. Negative for dizziness and weakness.    Social History  Substance Use Topics  . Smoking status: Former Smoker -- 0.75 packs/day for 5 years    Types: Cigarettes    Quit date: 06/04/2013  . Smokeless tobacco: Not on file  . Alcohol  Use: No   Objective:   BP 112/82 mmHg  Pulse 80  Temp(Src) 97.7 F (36.5 C) (Oral)  Resp 16  Wt 195 lb (88.451 kg)  SpO2 97%  Physical Exam  General appearance: alert, well developed, well nourished, cooperative and in no distress Head: Normocephalic, without obvious abnormality, atraumatic Respiratory: Respirations even and unlabored, normal respiratory rate Extremities: No gross deformities Skin: Mild intertrigo right inguinal area Psych: Appropriate mood and affect. Neurologic: Mental status: Alert, oriented to  person, place, and time, thought content appropriate.     Assessment & Plan:     1. Nausea Secondary to chemotherapy. Will start back on lower dose of Zofran and take at least 2 Senakot up to three times a day to prevent constipation.  - ondansetron (ZOFRAN) 4 MG tablet; Take 1 tablet (4 mg total) by mouth every 8 (eight) hours as needed for nausea or vomiting.  Dispense: 60 tablet; Refill: 4  2. Adenocarcinoma of left breast ([place])   3. Low back pain, unspecified back pain laterality, with sciatica presence unspecified Counseled that gabapentin is more effective when taken on scheduled basis then when taken prn. She has a nearly full bottle of the 100mg  and will start one at night and work up to 1-2 tablets three times a day  4. Intertrigo  - ketoconazole (NIZORAL) 2 % cream; Apply 1 application topically daily.  Dispense: 30 g; Refill: 1  5. Pre-syncope Negative cardiology work up.  - Ambulatory referral to Neurology     Follow up 5-6 weeks.   Lelon Huh, MD  [facility]

## 2016-06-09 ENCOUNTER — Ambulatory Visit
Admission: RE | Admit: 2016-06-09 | Discharge: 2016-06-09 | Disposition: A | Discharge status: Home / Self Care | Payer: Medicaid Other | Source: Ambulatory Visit | Attending: Family Medicine | Admitting: Family Medicine

## 2016-06-09 ENCOUNTER — Ambulatory Visit (INDEPENDENT_AMBULATORY_CARE_PROVIDER_SITE_OTHER): Payer: Medicaid Other | Admitting: Family Medicine

## 2016-06-09 ENCOUNTER — Encounter: Payer: Self-pay | Admitting: Family Medicine

## 2016-06-09 VITALS — BP 130/78 | HR 84 | Temp 98.7°F | Resp 16 | Wt 189.0 lb

## 2016-06-09 DIAGNOSIS — M25551 Pain in right hip: Secondary | ICD-10-CM | POA: Diagnosis present

## 2016-06-09 DIAGNOSIS — C50912 Malignant neoplasm of unspecified site of left female breast: Secondary | ICD-10-CM | POA: Insufficient documentation

## 2016-06-09 DIAGNOSIS — M160 Bilateral primary osteoarthritis of hip: Secondary | ICD-10-CM | POA: Insufficient documentation

## 2016-06-09 DIAGNOSIS — M25552 Pain in left hip: Secondary | ICD-10-CM

## 2016-06-09 MED ORDER — HYDROCODONE-IBUPROFEN 5-200 MG PO TABS: 1.0000 | ORAL_TABLET | Freq: Three times a day (TID) | ORAL | Status: DC | PRN

## 2016-06-09 NOTE — Progress Notes (Signed)
Patient: Kimberly Key Female    DOB: 11-28-1976   40 y.o.   MRN: [id number] Visit Date: 06/09/2016  Today's Provider: Vernie Murders, PA   Chief Complaint  Patient presents with  . Hip Pain    X 1 week.    Subjective:    Hip Pain  The incident occurred more than 1 week ago. There was no injury mechanism. The quality of the pain is described as aching and burning. The pain is moderate (can be severe). The pain has been constant since onset. Associated symptoms include an inability to bear weight and muscle weakness. Pertinent negatives include no numbness or tingling. The symptoms are aggravated by weight bearing. She has tried heat and rest for the symptoms. The treatment provided mild relief.  Patient reports that the pain is worse when she bears weight. Patient reports that it takes her a long time for her to stand up and get moving due to the pain. Patient reports that her oncologist has increased her Neurontin to 300mg  TID to help with the pain. Patient feels that the pain is due to the medication (injections) that she has to take for her breast cancer.   Past Medical History  Diagnosis Date  . Cancer [facility])     Breast Cancer  . Depression   . Steatohepatitis   . Mild dysplasia of cervix   . Impaired glucose tolerance   . Chronic headache   . History of abnormal cervical Pap smear     09/24/2010- abnormal, HPV Positive, ASCUS   Past Surgical History  Procedure Laterality Date  . Mastectomy Right 12/31/2013    [facility], Simple mastectomy with sentinal node biopsy  . Abdominal hysterectomy      partial due to CIN I  about 2011   Family History  Problem Relation Age of Onset  . Diabetes Mother   . Gallbladder disease Mother   . Anxiety disorder Father   . Gallbladder disease Sister   . Diabetes Paternal Uncle     type 2  . Coronary artery disease Other   . Depression Other   . Cancer Other    Allergies  Allergen Reactions  . Bupropion Hcl       lightheadness  . Cephalexin Itching  . Latex Rash  . Oxycodone-Acetaminophen Anxiety and Rash    Dizzy;"mentally not right"; did not ease pain  . Penicillins Rash  . Promethazine Rash    Tremors  . Venlafaxine Rash    Patient states she is only able to take Desvenlafaxine   Current Meds  Medication Sig  . desvenlafaxine (PRISTIQ) 100 MG 24 hr tablet Take 1 tablet by mouth daily.    Review of Systems  Constitutional: Positive for activity change and fatigue.  Respiratory: Negative.   Cardiovascular: Negative.   Gastrointestinal: Negative.   Genitourinary: Negative.   Musculoskeletal: Positive for myalgias, back pain, arthralgias, gait problem, neck pain and neck stiffness. Negative for joint swelling.  Neurological: Negative.  Negative for tingling and numbness.    Social History  Substance Use Topics  . Smoking status: Former Smoker -- 0.75 packs/day for 5 years    Types: Cigarettes    Quit date: 06/04/2013  . Smokeless tobacco: Not on file  . Alcohol Use: No   Objective:   BP 130/78 mmHg  Pulse 84  Temp(Src) 98.7 F (37.1 C)  Resp 16  Wt 189 lb (85.73 kg)  Physical Exam  Constitutional: She is oriented  to person, place, and time. She appears well-developed and well-nourished. No distress.  HENT:  Head: Normocephalic and atraumatic.  Right Ear: Hearing normal.  Left Ear: Hearing normal.  Nose: Nose normal.  Eyes: Conjunctivae and lids are normal. Right eye exhibits no discharge. Left eye exhibits no discharge. No scleral icterus.  Neck: Neck supple.  Cardiovascular: Normal rate and regular rhythm.   Pulmonary/Chest: Effort normal and breath sounds normal. No respiratory distress.  Abdominal: Soft. Bowel sounds are normal.  Musculoskeletal:  Pain in pelvis and legs to stand or walk. Good ROM sitting on exam table. No swelling or crepitus in joints.  Neurological: She is alert and oriented to person, place, and time.  Skin: Skin is intact. No lesion and no  rash noted.  Psychiatric: She has a normal mood and affect. Her speech is normal and behavior is normal. Thought content normal.      Assessment & Plan:     1. Bilateral hip pain Having increase in bilateral hip, pelvic and leg pains. No significant relief from [facility] and increase of Neurontin to 300 mg TID. No help with Flexeril or heating paid. Knows she has had anxiety and questionable rash with Percocet. Wants to try Hydrocodone because she has not had this reaction with it. Will get x-rays of both hips to rule out metastatic disease or pathologic fracture. - hydrocodone-ibuprofen (VICOPROFEN) 5-200 MG tablet; Take 1 tablet by mouth every 8 (eight) hours as needed for pain.  Dispense: 20 tablet; Refill: 0 - DG HIP UNILAT WITH PELVIS 2-3 VIEWS RIGHT - DG HIP UNILAT WITH PELVIS 2-3 VIEWS LEFT  2. Adenocarcinoma of left breast ([place]) Presently treated with Fulvestrant (an estrogen blocker) Xgeva (ususally used for bone mets) and Lupron injections. Side effects of each of these drugs lists bone and muscle pains. Has been scheduled for scans in August by oncologist. Will get x-rays to rule out metastasis or pathologic fractures. - DG HIP UNILAT WITH PELVIS 2-3 VIEWS RIGHT - DG HIP UNILAT WITH PELVIS 2-3 VIEWS LEFT       Vernie Murders, PA  [facility]

## 2016-06-10 ENCOUNTER — Telehealth: Payer: Self-pay

## 2016-06-10 NOTE — Telephone Encounter (Signed)
Patient advised as below. Patient verbalizes understanding and is in agreement with treatment plan.  

## 2016-06-10 NOTE — Telephone Encounter (Signed)
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 Proceed with scans planned by oncologist. If no better with trial of Vicoprofen, may need prednisone taper or referral to orthopedist for cortisone injections.

## 2016-06-14 ENCOUNTER — Encounter: Payer: Self-pay | Admitting: Neurology

## 2016-06-14 ENCOUNTER — Ambulatory Visit (INDEPENDENT_AMBULATORY_CARE_PROVIDER_SITE_OTHER): Payer: Medicaid Other | Admitting: Neurology

## 2016-06-14 VITALS — BP 146/104 | HR 57 | Ht 62.0 in | Wt 194.4 lb

## 2016-06-14 DIAGNOSIS — R404 Transient alteration of awareness: Secondary | ICD-10-CM

## 2016-06-14 HISTORY — DX: Transient alteration of awareness: R40.4

## 2016-06-14 NOTE — Patient Instructions (Addendum)
Remember to drink plenty of fluid, eat healthy meals and do not skip any meals. Try to eat protein with a every meal and eat a healthy snack such as fruit or nuts in between meals. Try to keep a regular sleep-wake schedule and try to exercise daily, particularly in the form of walking, 20-30 minutes a day, if you can.   As far as diagnostic testing: prolonged sleep deprived eeg. If this is negative will need to go to [facility] for possible EMU(Epilepsy Monitoring Unit) admission.   I would like to see you back for prolonged eeg, sooner if we need to. Please call us with any interim questions, concerns, problems, updates or refill requests.   Our phone number is [phone]. We also have an after hours call service for urgent matters and there is a physician on-call for urgent questions. For any emergencies you know to call 911 or go to the nearest emergency room

## 2016-06-14 NOTE — Progress Notes (Signed)
Kimberly Key    Provider:  Dr Jaynee Eagles Referring Provider: Birdie Sons, MD Primary Care Physician:  Lelon Huh, MD  CC:  Loss of consciusness  HPI:  Kimberly Key is a 40 y.o. female here as a referral from Dr. Caryn Section for pre-syncope. 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 She was seen by neurology at [facility] and was provided two MRIs and 2 EEGs both of which did not show any etiology for her passing out symptoms, told episodes not consistent with seizures.  Patient is here with her aunt who drover her today.  Episodes started in 2014 before she was on any of her medications. They thought it was her heart and she was evaluated multiple times by cardiology and then at [facility] for additional cardiology evaluations. She was also evaluated by Neurology at [facility] and told episodes not consistent with seizures. Son had seizures up to the age of 63 otherwise no family history of seizures. The episodes are getting worse over time. When they first started she wouldn;t pass out, she would just feel like she was going to pass out especially when standing up. She would start feeling funny, she would stand up and "pass out", a friend said she would tilt her head back and then throw up and "come to". The episodes have changed over time, she used to pass out for a few seconds or maybe a minute or two, she would feel hot and weak afterwards and wouldn't remember it. She would wake up and know what happened however. The episodes she has now: she will not feel well, and then "bam" she immediately has an episode, She completely loses consciousness, the people who have  seen the episodes  said they would talk to her and she wouldn;t respond, she remembers them cradling her head, she throws up and doesn't remember then, she "comes in and out" and the next day she remembers some of the episode. She doesn't urinate, she doesn;t defecate, she just "zones out" like he is in a daze, no jerking, no shaking, she is just "out of it". She has had the chance to have a friend or family member  videotaped the episodes. The last 2 episodes lasted 15-20 minutes each with 3-4 minutes of loc and 10-15 minutes of confusion. She can go for several months and not have any episodes. No known triggers. She has been driving, says no one ever told her she can't drive. She had an episode where she fell, chipped her teeth and broke her glasses. Last episode was in in April of this year. She is scared. She has gone several months in between episodes but then she can several episodes in a row. Not positional. Not associated with emotions or stress or illnesses or sleep deprivation, unknown and random. She has never had an extended eeg. In total, she has had 20 episodes over the last several years, "no rhyme or reason". She asked if I would provide support for disability.   Reviewed notes, labs and imaging from outside physicians, which showed:  CBC in June 2017 was unremarkable. CMP was unremarkable with creatinine 0.64.  Routine EEG October 2015 was performed after loss of consciousness. RESULTS: This is routine EEG  recorded in the awake and drowsy states is with in  normal limits. There is excessive beta activity likely due to medication.No epileptiform discharges were seen. No clinical or electrographic seizures were recorded.   She was evaluated in March 2016 by neurology at [facility].  Her complaints suggested a history more consistent with syncopal episodes due to orthostatic hypotension or cardiac etiology, she was provided with holter monitoring, tilt table test, cardiology consult.  During  neurology appt, She described episodes of loss of consciousness first event in August 2015 when she was sitting on the porch she stood up and felt lightheaded and unsteady and then couldn't remember the events after that. She was back to her baseline in about a minute. Per the witness report she was weak in extremities and did not have abnormal movements of extremities, no tongue biting or urinary incontinence. She had 12 similar episodes in 3 months prior to this appointment, last after he Lupron shot. At that time she did not change her position and the same episode happened while she was sitting. Her initial events were interpreted as syncopal episodes, after last event she was referred to neurology since it was not associated with positional changes. Neurologic exam was nonfocal. Appears she was ordered another EEG and a repeat MRI brain.   She was evaluated again in 06/2015 by neurology  for treatment of sleep disturbances and fatigue. She complained of nonrestorative sleep. She complained of morning headaches, being excessively tired and sleepy, naps frequently, also described episodes of "passing out" for which she was being treated from neurology stopped. She was scheduled for a sleep study.  SLEEP EVALUATION:   The study started at 08/05/2015 20:05 and ended at 08/06/2015 10:10. At the time of initiation of the study the heart was 78 bpm, respiratory rate was 14 bpm, end tidal CO2 was 42 torr and the oxygen saturation was 95%. The total recording time was 598 minutes with a total sleep time of 557.5 minutes. The patient's sleep latency was 2 minute(s) with 38.5 minute(s) of wake time recorded after sleep onset. The REM latency was 221 minutes. The sleep efficiency was 93.2%.  Total wake time during the night was 40.5 minute(s), which was 6.8% of the total recording time. The sleep stage percentages are as follows: Stage N1 = 4.8%, Stage N2 = 57.0%, Stage N3= 23.9%, and Stage R (REM sleep) = 14.4%. The  overall sleep architecture showed the majority of slow wave sleep to be in the early part of the night and the majority of the REM sleep to be in the latter part of the night.  Respiratory The patient had a total of 49 respiratory event(s) for an AHI of 5.3 per hour. There were 6 obstructive apnea(s), 0 mixed apnea(s), 11 central apnea(s) and 32 hypopnea(s). 3 event(s) occurred in Stage REM, 46 event(s) were noted in NREM. Total AI was 1.8. These respiratory events were associated with arousals and oxygen desaturation to a low of 88%. A total of 1 RERAs were noted for a RDI of 5.4. Cheyne Stokes was not noted.The maximal end tidal CO2 during sleep was 52.1 torr.   The patient spent 191.5 minute(s) in the supine position with a total of 34 events in NREM sleep in the supine position and 0 events in REM sleep in the supine position. The supine AHI is 10.7.  The patient spent 366.0 minute(s) in a side body position with 12 events in NREM sleep in the lateral decubitus position and 3 events  in REM sleep in the lateral decubitus position. The lateral positional AHI is 2.5 event(s) per hour.  The patient spent 0 minute(s) in the prone position. The patient spent 477 minutes in NREM sleep with 46 events during NREM sleep. The NREM AHI is 5.8.  The patient spent 80.5 minute(s) in REM sleep, of which 0 minutes were supine and 80.5 minutes were lateral. 3 event(s) occurred during REM sleep. The REM AHI is 2.2, REM supine AHI is 0 , and REM lateral AHI is 2.2. The average O2 saturation (SpO2) for the night was 95.1. For 100.0% of the night at saturation over 90% was monitored, for 0.0% of the night the saturation ranged between 80% and 90% and 0.0% of the night was spent at a saturation between 50% and 80%. The total time spent with O2 saturation =<88% was 0.1 minutes.  Limb Movements There were a total of 169 periodic limb movement events with a PLM Index of 18.2, 17 of which were associated with arousals, which  calculated to 1.8 event(s) per hour during sleep. 116 spontaneous arousal(s) were noted with an index of 12.5 arousal(s) per hour of sleep.   Other Associated Events The average heart rate in sleep was 78 with the average in REM sleep 91  and NREM 78. The peak heart rate in sleep was 118. The patient had no clear events of gastroesophageal reflux, cardiac arrhythmias or epileptiform activity on routine review of the study.    IMPRESSION: This study is abnormal due to the presence of : 1. Obstructive sleep apnea - worse in the supine position - the patient  should be considered for positional therapy in addition to conservative therapy (weight loss, avoidance of alcohol, nasal decongestant) to promote airway patency. If this is not successful then other therapies such as oral appliance, CPAP titration, or surgery should be considered. 2. Periodic Limb Movements in Sleep - the patient should be questioned regarding symptoms of Restless Legs Syndrome and given consideration for obtaining a ferritin level.    EEG 01/12/2016: This routine EEG recorded in the awake and drowsy states was significant for diffuse beta activity which could be secondary to medication use such as benzodiazepine among others.No epileptiform discharges were seen. No clinical or electrographic seizures were recorded. None of the patient's "passing out" episodes were captured during this recording. If clinically indicated, may consider video EEG monitoring in   the epilepsy monitoring unit for more detailed evaluation.     MRI of the brain 10/24/2016:   There is a nonspecific 23mm focus of T2/FLAIR signal abnormality in the right frontal lobe. There is no evidence of intracranial hemorrhage or acute infarct. There are no extra-axial fluid collections present. No diffusion weighted signal abnormality is identified.  There is no abnormal enhancement.  IMPRESSION:   Single punctate T2/FLAIR signal abnormality in the  right frontal lobe, otherwise Normal MRI of the brain.   MRI of the brain 01/2016:  TECHNIQUE: Multiplanar, multisequence MR imaging of the brain was performed without and with I.V. contrast.  FINDINGS:  Small focus of T2/FLAIR hyperintensity in the right frontal lobe centrum semiovale is unchanged, as is the tiny focus in the right midbrain (14:10).There is otherwise no focal parenchymal signal abnormality. There is no midline shift or mass lesion. There is no evidence of intracranial hemorrhage or acute infarct. There are no extra-axial fluid collections present.   No diffusion weighted signal abnormality is identified.  There is no abnormal intracranial enhancement.   IMPRESSION: --Unchanged nonspecific  T2/FLAIR hyperintensities in the right frontal lobe and midbrain. Otherwise unremarkable exam.   Review of Systems: Patient complains of symptoms per HPI as well as the following symptoms: Fevers, fatigue, blurred vision, shortness of breath, spinning sensation, easy bruising, lymph nodes, feeling hot, joint pain, skin sensitivity, memory loss, confusion, weakness, dizziness, passing out, insomnia, sleepiness, depression, too much sleep, not enough sleep, decreased energy, disinterest in activities increasing from sit. Pertinent negatives per HPI. All others negative.   Social History   Social History  . Marital Status: Legally Separated    Spouse Name: N/A  . Number of Children: 2  . Years of Education: 12   Occupational History  . unemployed    Social History Main Topics  . Smoking status: Former Smoker -- 0.75 packs/day for 5 years    Types: Cigarettes    Quit date: 06/04/2013  . Smokeless tobacco: Not on file  . Alcohol Use: No  . Drug Use: No  . Sexual Activity: Not on file   Other Topics Concern  . Not on file   Social History Narrative   Lives with her two children   Caffeine use: 1 cup per day    Family History  Problem Relation Age of Onset  . Diabetes  Mother   . Gallbladder disease Mother   . Anxiety disorder Father   . Gallbladder disease Sister   . Diabetes Paternal Uncle     type 2  . Coronary artery disease Other   . Depression Other   . Cancer Other   . Seizures Other     Past Medical History  Diagnosis Date  . Cancer [facility])     Breast Cancer  . Depression   . Steatohepatitis   . Mild dysplasia of cervix   . Impaired glucose tolerance   . Chronic headache   . History of abnormal cervical Pap smear     09/24/2010- abnormal, HPV Positive, ASCUS    Past Surgical History  Procedure Laterality Date  . Mastectomy Right 12/31/2013    [facility], Simple mastectomy with sentinal node biopsy  . Abdominal hysterectomy      partial due to CIN I  about 2011    Current Outpatient Prescriptions  Medication Sig Dispense Refill  . Calcium-Magnesium-Zinc 333-133-5 MG TABS Take 2 tablets by mouth daily.    . cyanocobalamin 1000 MCG tablet Take 1,000 mcg by mouth daily.    . Denosumab (XGEVA [place]) Inject 1 Dose into the skin. Every 45 days    . desvenlafaxine (PRISTIQ) 100 MG 24 hr tablet Take 1 tablet by mouth daily.    . fulvestrant (FASLODEX) 250 MG/5ML injection Inject 250 mg into the muscle every 30 (thirty) days. One injection each buttock over 1-2 minutes. Warm prior to use.    . gabapentin (NEURONTIN) 100 MG capsule Take 300 mg by mouth 3 (three) times daily.     . hydrocodone-ibuprofen (VICOPROFEN) 5-200 MG tablet Take 1 tablet by mouth every 8 (eight) hours as needed for pain. 20 tablet 0  . hydrOXYzine (ATARAX/VISTARIL) 25 MG tablet Take 25 mg by mouth every 6 (six) hours. Reported on 05/13/2016    . leuprolide (LUPRON) 3.75 MG injection Inject 3.75 mg into the muscle every 30 (thirty) days.    Marland Kitchen omeprazole (PRILOSEC) 20 MG capsule Take 20 mg by mouth daily.    . ondansetron (ZOFRAN) 4 MG tablet Take 1 tablet (4 mg total) by mouth every 8 (eight) hours as needed for nausea or  vomiting. 60 tablet 4   No current  facility-administered medications for this visit.    Allergies as of 06/14/2016 - Review Complete 06/14/2016  Allergen Reaction Noted  . Bupropion hcl  02/18/2016  . Cephalexin Itching 02/18/2016  . Latex Rash 08/14/2015  . Oxycodone-acetaminophen Anxiety and Rash 08/14/2015  . Penicillins Rash 08/14/2015  . Promethazine Rash 02/18/2016  . Venlafaxine Rash 02/18/2016    Vitals: BP 146/104 mmHg  Pulse 57  Ht 5\' 2"  (1.575 m)  Wt 194 lb 6.4 oz (88.179 kg)  BMI 35.55 kg/m2 Last Weight:  Wt Readings from Last 1 Encounters:  06/14/16 194 lb 6.4 oz (88.179 kg)   Last Height:   Ht Readings from Last 1 Encounters:  06/14/16 5\' 2"  (1.575 m)    Physical exam: Exam: Gen: NAD, conversant, well nourised, obese, well groomed                     CV: RRR, no MRG. No Carotid Bruits. No peripheral edema, warm, nontender Eyes: Conjunctivae clear without exudates or hemorrhage  Neuro: Detailed Neurologic Exam  Speech:    Speech is normal; fluent and spontaneous with normal comprehension.  Cognition:    The patient is oriented to person, place, and time;     recent and remote memory intact;     language fluent;     normal attention, concentration,     fund of knowledge Cranial Nerves:    The pupils are equal, round, and reactive to light. The fundi are normal and spontaneous venous pulsations are present. Visual fields are full to finger confrontation. Extraocular movements are intact. Trigeminal sensation is intact and the muscles of mastication are normal. The face is symmetric. The palate elevates in the midline. Hearing intact. Voice is normal. Shoulder shrug is normal. The tongue has normal motion without fasciculations.   Coordination:    Normal finger to nose and heel to shin.   Gait:    Heel-toe and tandem gait are normal.   Motor Observation:    No asymmetry, no atrophy, and no involuntary movements noted. Tone:    Normal muscle tone.    Posture:    Posture is normal.  normal erect    Strength:    Strength is V/V in the upper and lower limbs.      Sensation: intact to LT     Reflex Exam:  DTR's:    Deep tendon reflexes in the upper and lower extremities are normal bilaterally.   Toes:    The toes are downgoing bilaterally.   Clonus:    Clonus is absent.      Assessment/Plan:  40 year old female with several years of syncopal/loss of consciousness episodes. She is been extensively evaluated by cardiology and seen at [facility] by neurology. 2 routine EEGs were negative and 2 MRIs were unremarkable.  - Sleep deprived EEG 90 minutes here in the office. - If this third EEG is negative, will refer to note on to Dr. [facility] see if she is appropriate for EMU admission - Discussed the possibility of nonepileptic events, but these could be stress related -Patient is unable to drive, operate heavy machinery, perform activities at heights or participate in water activities until 6 months seizure free - Recommend calling 911 during the next episode considering patient states that lasting longer.  - If possible please try videotaping one of the episodes. - We will call with results of the EEG and extensive.   Kimberly Ill, MD  [facility] [address] [place], [place] [address]  Phone [phone] Fax [phone]

## 2016-06-15 ENCOUNTER — Telehealth: Payer: Self-pay | Admitting: *Deleted

## 2016-06-15 NOTE — Telephone Encounter (Signed)
Called pt. Advised I could not find a Dr Marlou Sa - cardiologist contact info via [facility]. She stated she was not sure if this was the name or not. She is not sure who it was. She stated to just send to Dr Lonia Chimera (oncologist at [facility]) and not worry about sending to cardiologist. She will have Dr Lonia Chimera send to cardiologist if needed. I verbalized understanding.   I called and spoke with Dr Lonia Chimera office and they advised me to fax note to [phone] which will go directing to the computer. Alternative fax: [phone].   I faxed note to fax number above. Received confirmation.

## 2016-06-15 NOTE — Telephone Encounter (Signed)
Refaxed notes to number below, [phone]. Received fax confirmation.

## 2016-06-15 NOTE — Telephone Encounter (Signed)
Kimberly for Medical Oncology/Hemotologycalled said she rec'd a fax for Dr Lonia Chimera but he has not been in [place] for the past 5 years. He is at [facility], she gave a fax number for him of [phone]. I thanked her for her time she took to call to let the RN know.

## 2016-06-20 ENCOUNTER — Telehealth: Payer: Self-pay | Admitting: Family Medicine

## 2016-06-20 DIAGNOSIS — M25551 Pain in right hip: Secondary | ICD-10-CM

## 2016-06-20 DIAGNOSIS — M25552 Pain in left hip: Principal | ICD-10-CM

## 2016-06-20 NOTE — Telephone Encounter (Signed)
If no better after the use of Vicoprofen, recommend referral to orthopedist for evaluation and treatment of hip pains.

## 2016-06-20 NOTE — Telephone Encounter (Signed)
Please review-aa 

## 2016-06-20 NOTE — Telephone Encounter (Signed)
Pt contacted office for refill request on the following medications:  hydrocodone-ibuprofen (VICOPROFEN) 5-200 MG tablet.  [id number]

## 2016-06-21 NOTE — Telephone Encounter (Signed)
Schedule orthopedic referral for bilateral hip pains due to degenerative arthritis on hip x-rays. Has a history of Stage IIB left breast cancer with mastectomy Feb. 2015 and resection of liver metastases March 2017. Scheduled for CT PET scan by Dr. Lonia Chimera (oncologist) 07-05-16.

## 2016-06-21 NOTE — Telephone Encounter (Signed)
Advised patient as below. Patient reports that she would like to proceed with the orthopedic referral.

## 2016-07-14 ENCOUNTER — Ambulatory Visit (INDEPENDENT_AMBULATORY_CARE_PROVIDER_SITE_OTHER): Payer: Medicaid Other | Admitting: Neurology

## 2016-07-14 DIAGNOSIS — R55 Syncope and collapse: Secondary | ICD-10-CM | POA: Diagnosis not present

## 2016-07-14 DIAGNOSIS — R404 Transient alteration of awareness: Secondary | ICD-10-CM

## 2016-07-14 NOTE — Procedures (Signed)
    History:  Kimberly Key is a 40 year old patient with a history of episodes that started in 2014. The patient will have episodes initially where she would not pass out, she had the feeling of near-syncope, associated with nausea and vomiting. As time has gone on, the episodes have evolved into episodes where she has a feeling of not doing well, then she will have loss of consciousness. The patient will not respond during the events. The patient may have nausea and vomiting. The episodes may last 15-20 minutes associated with 3-4 minutes of loss of consciousness.  This is a sleep deprived prolonged EEG study. No skull defects are noted. Medications include calcium supplementation, Xgeva, Pristiq, Faslodex, Neurontin, Vicoprofen, hydroxyzine, Lupron, Prilosec, and Zofran.   EEG classification: Normal awake and asleep  Description of the recording: The background rhythms of this recording consists of a fairly well modulated medium amplitude background activity of 10 Hz. As the record progresses, the patient initially is in the waking state, but appears to enter the early stage II sleep during the recording, with rudimentary sleep spindles and vertex sharp wave activity seen. During the wakeful state, photic stimulation is performed, and this results in a bilateral and symmetric photic driving response. Hyperventilation was also performed, and this results in a minimal buildup of the background rhythm activities without significant slowing seen. At no time during the recording does there appear to be evidence of spike or spike wave discharges or evidence of focal slowing. EKG monitor shows no evidence of cardiac rhythm abnormalities with a heart rate of 66.  Impression: This is a normal EEG recording in the waking and sleeping state. No evidence of ictal or interictal discharges were seen at any time during the recording.

## 2016-07-19 ENCOUNTER — Telehealth: Payer: Self-pay | Admitting: *Deleted

## 2016-07-19 NOTE — Telephone Encounter (Signed)
Called and spoke to patient. Relayed Dr Jaynee Eagles message. Patient responded stating "None of that made sense. I am getting ready to go in for a procedure. Can you call me back another time? None of that made sense to me right now". I advised pt to call me back when it is convenient for her. She verbalized understanding.

## 2016-07-19 NOTE — Telephone Encounter (Signed)
-----   Message from Melvenia Beam, MD sent at 07/18/2016  6:05 PM EDT ----- EEG was normal. I can refer her to Dr. Ernest Haber at [facility] for evaluation of inpatient EEG monitoring for 72 hours. Alternatively I can have her hooked up at home for 3 days. Let me know which one she prefers thanks

## 2016-07-27 NOTE — Telephone Encounter (Signed)
Called and LVM for pt to call since we have not heard back from her. Gave [facility] phone number.

## 2016-08-04 ENCOUNTER — Encounter: Payer: Self-pay | Admitting: *Deleted

## 2016-08-04 NOTE — Telephone Encounter (Signed)
Sent patient letter since no return call/unable to reach.

## 2016-09-01 ENCOUNTER — Encounter: Payer: Self-pay | Admitting: Family Medicine

## 2016-09-01 ENCOUNTER — Ambulatory Visit (INDEPENDENT_AMBULATORY_CARE_PROVIDER_SITE_OTHER): Payer: Medicaid Other | Admitting: Family Medicine

## 2016-09-01 VITALS — BP 124/84 | HR 100 | Temp 98.3°F | Resp 16 | Wt 190.4 lb

## 2016-09-01 DIAGNOSIS — R21 Rash and other nonspecific skin eruption: Secondary | ICD-10-CM

## 2016-09-01 DIAGNOSIS — L749 Eccrine sweat disorder, unspecified: Secondary | ICD-10-CM

## 2016-09-01 DIAGNOSIS — L750 Bromhidrosis: Secondary | ICD-10-CM

## 2016-09-01 LAB — POCT URINALYSIS DIPSTICK
Bilirubin, UA: NEGATIVE
Glucose, UA: NEGATIVE
Ketones, UA: NEGATIVE
LEUKOCYTES UA: NEGATIVE
NITRITE UA: NEGATIVE
PH UA: 6
Protein, UA: NEGATIVE
RBC UA: NEGATIVE
Spec Grav, UA: <=1.005
Urobilinogen, UA: 0.2

## 2016-09-01 MED ORDER — FLUOCINONIDE 0.05 % EX CREA
1.0000 | TOPICAL_CREAM | Freq: Three times a day (TID) | CUTANEOUS | 0 refills | Status: DC
Start: 2016-09-01 — End: 2016-12-30

## 2016-09-01 NOTE — Progress Notes (Signed)
Subjective:     Patient ID: Kimberly Key, female   DOB: 1976/08/31, 40 y.o.   MRN: [id number]  HPI  Chief Complaint  Patient presents with  . Rash    Patient comes in office today with concerns of rash that has been present for a week. Patient states that rash initally began under her breast and waist line and now has spread all over body. Patient reports rawness around belly button area , rash spreading to arms, legs, hands, pubic area, back and buttock.   . urinary odor    Patient would like to address urinary odor for the past week.She denies frequency/urgency, dysuria, back pain, new sexual partner, or vaginal discharge or odor.   States rash is intermittent and will spontaneously resolve. Reports it is mildly itchy. States she does not spend time outside and there is a cat in the house. Remains on immunosuppressive medication for breast cancer. States she has been referred to dermatology in the past but rash resolved before the appointment. 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 Followed by psychiatry for depression.   Review of Systems     Objective:   Physical Exam  Constitutional: She appears well-developed and well-nourished. No distress.  Skin:  Several discrete 2-3 mm. Papules along waist line and a few distributed on her extremities. Some are excoriated but no pustules, vesicles or scaling. Umbilicus appears mildly erythematous.       Assessment:    1. Urinary body odor: urine clear - POCT urinalysis dipstick  2. Rash and nonspecific skin eruption: ? Papular urticaria ? Insect bites - fluocinonide cream (LIDEX) 0.05 %; Apply 1 application topically 3 (three) times daily. To rash  Dispense: 30 g; Refill: 0    Plan:    Discussed adding Claritin daily. Call if not improving for dermatology referral.

## 2016-09-01 NOTE — Patient Instructions (Signed)
Discussed taking Claritin daily for itching.

## 2016-12-30 ENCOUNTER — Encounter: Payer: Self-pay | Admitting: Family Medicine

## 2016-12-30 ENCOUNTER — Ambulatory Visit (INDEPENDENT_AMBULATORY_CARE_PROVIDER_SITE_OTHER): Payer: Medicaid Other | Admitting: Family Medicine

## 2016-12-30 VITALS — BP 124/82 | HR 87 | Temp 98.0°F | Resp 16 | Wt 203.0 lb

## 2016-12-30 DIAGNOSIS — L304 Erythema intertrigo: Secondary | ICD-10-CM | POA: Diagnosis not present

## 2016-12-30 DIAGNOSIS — M545 Low back pain: Secondary | ICD-10-CM

## 2016-12-30 DIAGNOSIS — L03311 Cellulitis of abdominal wall: Secondary | ICD-10-CM | POA: Diagnosis not present

## 2016-12-30 LAB — POCT URINALYSIS DIPSTICK
BILIRUBIN UA: NEGATIVE
Blood, UA: NEGATIVE
GLUCOSE UA: NEGATIVE
KETONES UA: NEGATIVE
LEUKOCYTES UA: NEGATIVE
Nitrite, UA: NEGATIVE
Protein, UA: NEGATIVE
Spec Grav, UA: >=1.03
Urobilinogen, UA: 0.2
pH, UA: 5

## 2016-12-30 MED ORDER — KETOCONAZOLE 2 % EX CREA: 1.0000 "application " | TOPICAL_CREAM | Freq: Every day | CUTANEOUS | 0 refills | Status: DC

## 2016-12-30 MED ORDER — SULFAMETHOXAZOLE-TRIMETHOPRIM 800-160 MG PO TABS: 2.0000 | ORAL_TABLET | Freq: Two times a day (BID) | ORAL | 0 refills | Status: AC

## 2016-12-30 NOTE — Progress Notes (Signed)
Patient: Kimberly Key Female    DOB: Jan 06, 1976   41 y.o.   MRN: [id number] Visit Date: 12/30/2016  Today's Provider: Lelon Huh, MD   Chief Complaint  Patient presents with  . Abdominal Pain   Subjective:    Abdominal Pain  This is a chronic problem. The current episode started more than 1 year ago. The problem occurs intermittently. The problem has been waxing and waning. The pain is located in the periumbilical region. The quality of the pain is dull. The abdominal pain does not radiate. Associated symptoms include frequency, myalgias and nausea. Pertinent negatives include no constipation, diarrhea, dysuria, fever, flatus, headaches, hematuria, melena or vomiting.  Patient reports that she has a pale yellow discharge coming from her belly button. She has itchy bumps under the right breast. She has painful bumps around her pubic area that swell up and drain pus. Patient states she has a vaginal odor and itching. Patient denies any vaginal discharge. Her urine has an odor.      Allergies  Allergen Reactions  . Bupropion Hcl     lightheadness  . Cephalexin Itching  . Latex Rash  . Oxycodone-Acetaminophen Anxiety and Rash    Dizzy;"mentally not right"; did not ease pain  . Penicillins Rash  . Promethazine Rash    Tremors  . Venlafaxine Rash    Patient states she is only able to take Desvenlafaxine     Current Outpatient Prescriptions:  .  Calcium-Magnesium-Zinc 333-133-5 MG TABS, Take 2 tablets by mouth daily., Disp: , Rfl:  .  cyanocobalamin 1000 MCG tablet, Take 1,000 mcg by mouth daily., Disp: , Rfl:  .  Denosumab (XGEVA [place]), Inject 1 Dose into the skin. Every 45 days, Disp: , Rfl:  .  desvenlafaxine (PRISTIQ) 100 MG 24 hr tablet, Take 1 tablet by mouth daily., Disp: , Rfl:  .  fulvestrant (FASLODEX) 250 MG/5ML injection, Inject 250 mg into the muscle every 30 (thirty) days. One injection each buttock over 1-2 minutes. Warm prior to use., Disp: , Rfl:  .   Leuprolide Acetate (LUPRON DEPOT, 73-MONTH, IM), Inject into the muscle., Disp: , Rfl:  .  OLANZapine (ZYPREXA) 5 MG tablet, Take 5 mg by mouth at bedtime., Disp: , Rfl:  .  ondansetron (ZOFRAN) 4 MG tablet, Take 1 tablet (4 mg total) by mouth every 8 (eight) hours as needed for nausea or vomiting., Disp: 60 tablet, Rfl: 4 .  traMADol (ULTRAM) 50 MG tablet, 1 or 2 tablets every 4 to 6 hours as needed for pain, Disp: , Rfl:   Review of Systems  Constitutional: Negative for appetite change, chills, fatigue and fever.  Respiratory: Negative for chest tightness and shortness of breath.   Cardiovascular: Negative for chest pain and palpitations.  Gastrointestinal: Positive for abdominal pain and nausea. Negative for constipation, diarrhea, flatus, melena and vomiting.  Genitourinary: Positive for flank pain, frequency, genital sores and pelvic pain. Negative for decreased urine volume, difficulty urinating, dysuria, hematuria, urgency, vaginal bleeding, vaginal discharge and vaginal pain.  Musculoskeletal: Positive for back pain and myalgias.  Skin: Positive for rash.  Neurological: Negative for dizziness, weakness and headaches.    Social History  Substance Use Topics  . Smoking status: Former Smoker    Packs/day: 0.75    Years: 5.00    Types: Cigarettes    Quit date: 06/04/2013  . Smokeless tobacco: Never Used  . Alcohol use No   Objective:   BP 124/82 (BP Location: Left  Arm, Patient Position: Sitting, Cuff Size: Large)   Pulse 87   Temp 98 F (36.7 C) (Oral)   Resp 16   Wt 203 lb (92.1 kg)   SpO2 98% Comment: room air  BMI 37.13 kg/m   Physical Exam  General Appearance:    Alert, cooperative, no distress  Eyes:    PERRL, conjunctiva/corneas clear, EOM's intact       Lungs:     Clear to auscultation bilaterally, respirations unlabored  Heart:    Regular rate and rhythm  Abdomen:   bowel sounds present and normal in all 4 quadrants, soft, round or nontender. No CVA tenderness.  Small amount of purulent material in umbilicus. Slightly inflamed.    Results for orders placed or performed in visit on 12/30/16  POCT Urinalysis Dipstick  Result Value Ref Range   Color, UA yellow    Clarity, UA clear    Glucose, UA negative    Bilirubin, UA negative    Ketones, UA negative    Spec Grav, UA >=1.030    Blood, UA negative    pH, UA 5.0    Protein, UA negative    Urobilinogen, UA 0.2    Nitrite, UA negative    Leukocytes, UA Negative Negative        Assessment & Plan:     1. Low back pain, unspecified back pain laterality, unspecified chronicity, with sciatica presence unspecified  - POCT Urinalysis Dipstick  2. Intertrigo  - ketoconazole (NIZORAL) 2 % cream; Apply 1 application topically daily.  Dispense: 60 g; Refill: 0  3. Cellulitis of abdominal wall  - sulfamethoxazole-trimethoprim (BACTRIM DS,SEPTRA DS) 800-160 MG tablet; Take 2 tablets by mouth 2 (two) times daily.  Dispense: 56 tablet; Refill: 0  Call if symptoms change or if not rapidly improving.            Lelon Huh, MD  [facility]

## 2016-12-30 NOTE — Patient Instructions (Signed)
Call if symptoms are not improving within a week

## 2017-01-16 ENCOUNTER — Telehealth: Payer: Self-pay

## 2017-01-16 DIAGNOSIS — R21 Rash and other nonspecific skin eruption: Secondary | ICD-10-CM

## 2017-01-16 NOTE — Telephone Encounter (Signed)
Please refer pt to Dermatology. Thanks!

## 2017-01-16 NOTE — Telephone Encounter (Signed)
Should be better by now. Needs referral to dermatology

## 2017-01-16 NOTE — Telephone Encounter (Signed)
Patient called saying that she still has a rash under her breast. She reports that she was seen in the office about 2 weeks ago, and was treated with an abx and topical cream. She reports that she finished the abx on Saturday (2 days ago), and seems like the rash has gotten worse. Patient is wanting to know should she be referred to GYN for this issue, or does she need another round of abx? Contact number is correct. [facility] [place]. Thanks!

## 2017-01-16 NOTE — Telephone Encounter (Signed)
Please advise 

## 2017-05-16 ENCOUNTER — Emergency Department: Payer: Medicare Other | Admitting: *Deleted

## 2017-05-16 ENCOUNTER — Observation Stay
Admission: EM | Admit: 2017-05-16 | Discharge: 2017-05-17 | Disposition: A | Discharge status: Home / Self Care | Payer: Medicare Other | Attending: Surgery | Admitting: Surgery

## 2017-05-16 ENCOUNTER — Emergency Department: Payer: Medicare Other

## 2017-05-16 ENCOUNTER — Encounter
Admission: EM | Disposition: A | Discharge status: Home / Self Care | Payer: Self-pay | Source: Home / Self Care | Attending: Emergency Medicine

## 2017-05-16 DIAGNOSIS — K353 Acute appendicitis with localized peritonitis, without perforation or gangrene: Secondary | ICD-10-CM | POA: Insufficient documentation

## 2017-05-16 DIAGNOSIS — F419 Anxiety disorder, unspecified: Secondary | ICD-10-CM | POA: Diagnosis not present

## 2017-05-16 DIAGNOSIS — K381 Appendicular concretions: Principal | ICD-10-CM | POA: Insufficient documentation

## 2017-05-16 DIAGNOSIS — K3589 Other acute appendicitis without perforation or gangrene: Secondary | ICD-10-CM

## 2017-05-16 DIAGNOSIS — Z9012 Acquired absence of left breast and nipple: Secondary | ICD-10-CM | POA: Insufficient documentation

## 2017-05-16 DIAGNOSIS — F329 Major depressive disorder, single episode, unspecified: Secondary | ICD-10-CM | POA: Diagnosis not present

## 2017-05-16 DIAGNOSIS — J449 Chronic obstructive pulmonary disease, unspecified: Secondary | ICD-10-CM | POA: Diagnosis not present

## 2017-05-16 DIAGNOSIS — Z853 Personal history of malignant neoplasm of breast: Secondary | ICD-10-CM | POA: Diagnosis not present

## 2017-05-16 DIAGNOSIS — K37 Unspecified appendicitis: Secondary | ICD-10-CM | POA: Diagnosis present

## 2017-05-16 DIAGNOSIS — Z87891 Personal history of nicotine dependence: Secondary | ICD-10-CM | POA: Diagnosis not present

## 2017-05-16 HISTORY — PX: LAPAROSCOPIC APPENDECTOMY: SHX408

## 2017-05-16 HISTORY — DX: Unspecified appendicitis: K37

## 2017-05-16 LAB — COMPREHENSIVE METABOLIC PANEL
ALK PHOS: 71 U/L (ref 38–126)
ALT: 28 U/L (ref 14–54)
ANION GAP: 7 (ref 5–15)
AST: 30 U/L (ref 15–41)
Albumin: 4.1 g/dL (ref 3.5–5.0)
BILIRUBIN TOTAL: 0.5 mg/dL (ref 0.3–1.2)
BUN: 18 mg/dL (ref 6–20)
CO2: 28 mmol/L (ref 22–32)
CREATININE: 0.56 mg/dL (ref 0.44–1.00)
Calcium: 9.4 mg/dL (ref 8.9–10.3)
Chloride: 105 mmol/L (ref 101–111)
Glucose, Bld: 133 mg/dL — ABNORMAL HIGH (ref 65–99)
Potassium: 4.1 mmol/L (ref 3.5–5.1)
SODIUM: 140 mmol/L (ref 135–145)
TOTAL PROTEIN: 7.3 g/dL (ref 6.5–8.1)

## 2017-05-16 LAB — CBC WITH DIFFERENTIAL/PLATELET
BASOS ABS: 0.1 10*3/uL (ref 0–0.1)
Basophils Relative: 1 %
EOS PCT: 1 %
Eosinophils Absolute: 0.1 10*3/uL (ref 0–0.7)
HEMATOCRIT: 41.9 % (ref 35.0–47.0)
Hemoglobin: 14.2 g/dL (ref 12.0–16.0)
LYMPHS ABS: 0.6 10*3/uL — AB (ref 1.0–3.6)
LYMPHS PCT: 6 %
MCH: 30.6 pg (ref 26.0–34.0)
MCHC: 34 g/dL (ref 32.0–36.0)
MCV: 90 fL (ref 80.0–100.0)
MONO ABS: 0.4 10*3/uL (ref 0.2–0.9)
Monocytes Relative: 4 %
NEUTROS ABS: 9.7 10*3/uL — AB (ref 1.4–6.5)
Neutrophils Relative %: 88 %
PLATELETS: 202 10*3/uL (ref 150–440)
RBC: 4.65 MIL/uL (ref 3.80–5.20)
RDW: 13.6 % (ref 11.5–14.5)
WBC: 10.9 10*3/uL (ref 3.6–11.0)

## 2017-05-16 LAB — URINALYSIS, COMPLETE (UACMP) WITH MICROSCOPIC
BILIRUBIN URINE: NEGATIVE
Bacteria, UA: NONE SEEN
Glucose, UA: NEGATIVE mg/dL
HGB URINE DIPSTICK: NEGATIVE
Ketones, ur: 5 mg/dL — AB
LEUKOCYTES UA: NEGATIVE
NITRITE: NEGATIVE
PH: 8 (ref 5.0–8.0)
Protein, ur: 30 mg/dL — AB
SPECIFIC GRAVITY, URINE: 1.019 (ref 1.005–1.030)

## 2017-05-16 LAB — APTT: APTT: 33 s (ref 24–36)

## 2017-05-16 LAB — HCG, QUANTITATIVE, PREGNANCY: hCG, Beta Chain, Quant, S: <1 m[IU]/mL (ref <5)

## 2017-05-16 LAB — LIPASE, BLOOD: Lipase: 19 U/L (ref 11–51)

## 2017-05-16 LAB — PROTIME-INR
INR: 1.1
Prothrombin Time: 14.2 seconds (ref 11.4–15.2)

## 2017-05-16 SURGERY — APPENDECTOMY, LAPAROSCOPIC
Anesthesia: General | Site: Abdomen | Wound class: Clean Contaminated

## 2017-05-16 MED ORDER — ROCURONIUM BROMIDE 100 MG/10ML IV SOLN
INTRAVENOUS | Status: DC | PRN
  Administered 2017-05-16: 30 mg via INTRAVENOUS

## 2017-05-16 MED ORDER — ACETAMINOPHEN 10 MG/ML IV SOLN
INTRAVENOUS | Status: DC | PRN
  Administered 2017-05-16: 1000 mg via INTRAVENOUS

## 2017-05-16 MED ORDER — MIDAZOLAM HCL 2 MG/2ML IJ SOLN
INTRAMUSCULAR | Status: DC | PRN
  Administered 2017-05-16: 2 mg via INTRAVENOUS

## 2017-05-16 MED ORDER — GLYCOPYRROLATE 0.2 MG/ML IJ SOLN
INTRAMUSCULAR | Status: AC
  Filled 2017-05-16: qty 1

## 2017-05-16 MED ORDER — MORPHINE SULFATE (PF) 4 MG/ML IV SOLN: 4.0000 mg | INTRAVENOUS | Status: DC | PRN

## 2017-05-16 MED ORDER — DEXAMETHASONE SODIUM PHOSPHATE 10 MG/ML IJ SOLN
INTRAMUSCULAR | Status: AC
  Filled 2017-05-16: qty 1

## 2017-05-16 MED ORDER — ONDANSETRON HCL 4 MG/2ML IJ SOLN: 4.0000 mg | Freq: Once | INTRAMUSCULAR | Status: DC | PRN

## 2017-05-16 MED ORDER — PROPOFOL 10 MG/ML IV BOLUS
INTRAVENOUS | Status: DC | PRN
  Administered 2017-05-16: 200 mg via INTRAVENOUS

## 2017-05-16 MED ORDER — DEXAMETHASONE SODIUM PHOSPHATE 10 MG/ML IJ SOLN
INTRAMUSCULAR | Status: DC | PRN
  Administered 2017-05-16: 4 mg via INTRAVENOUS

## 2017-05-16 MED ORDER — FENTANYL CITRATE (PF) 100 MCG/2ML IJ SOLN
INTRAMUSCULAR | Status: DC | PRN
  Administered 2017-05-16: 50 ug via INTRAVENOUS
  Administered 2017-05-16: 100 ug via INTRAVENOUS

## 2017-05-16 MED ORDER — ONDANSETRON HCL 4 MG/2ML IJ SOLN
INTRAMUSCULAR | Status: DC | PRN
  Administered 2017-05-16: 4 mg via INTRAVENOUS

## 2017-05-16 MED ORDER — ESMOLOL HCL 100 MG/10ML IV SOLN
INTRAVENOUS | Status: AC
  Filled 2017-05-16: qty 10

## 2017-05-16 MED ORDER — KETOROLAC TROMETHAMINE 30 MG/ML IJ SOLN: 30.0000 mg | Freq: Four times a day (QID) | INTRAMUSCULAR | Status: DC | PRN

## 2017-05-16 MED ORDER — PROPOFOL 500 MG/50ML IV EMUL
INTRAVENOUS | Status: AC
  Filled 2017-05-16: qty 50

## 2017-05-16 MED ORDER — SODIUM CHLORIDE 0.9 % IV BOLUS (SEPSIS)
1000.0000 mL | Freq: Once | INTRAVENOUS | Status: AC
  Administered 2017-05-16: 1000 mL via INTRAVENOUS

## 2017-05-16 MED ORDER — SUCCINYLCHOLINE CHLORIDE 20 MG/ML IJ SOLN
INTRAMUSCULAR | Status: AC
  Filled 2017-05-16: qty 1

## 2017-05-16 MED ORDER — ROCURONIUM BROMIDE 50 MG/5ML IV SOLN
INTRAVENOUS | Status: AC
Start: 2017-05-16 — End: 2017-05-16
  Filled 2017-05-16: qty 1

## 2017-05-16 MED ORDER — ONDANSETRON 4 MG PO TBDP: 4.0000 mg | ORAL_TABLET | Freq: Four times a day (QID) | ORAL | Status: DC | PRN

## 2017-05-16 MED ORDER — BUPIVACAINE-EPINEPHRINE 0.25% -1:200000 IJ SOLN
INTRAMUSCULAR | Status: DC | PRN
  Administered 2017-05-16: 30 mL

## 2017-05-16 MED ORDER — LACTATED RINGERS IV SOLN
INTRAVENOUS | Status: DC
  Administered 2017-05-16 – 2017-05-17 (×2): via INTRAVENOUS

## 2017-05-16 MED ORDER — SUGAMMADEX SODIUM 200 MG/2ML IV SOLN
INTRAVENOUS | Status: AC
  Filled 2017-05-16: qty 2

## 2017-05-16 MED ORDER — ONDANSETRON HCL 4 MG/2ML IJ SOLN: 4.0000 mg | Freq: Four times a day (QID) | INTRAMUSCULAR | Status: DC | PRN

## 2017-05-16 MED ORDER — FENTANYL CITRATE (PF) 100 MCG/2ML IJ SOLN
25.0000 ug | INTRAMUSCULAR | Status: DC | PRN
  Administered 2017-05-16 (×4): 25 ug via INTRAVENOUS

## 2017-05-16 MED ORDER — FENTANYL CITRATE (PF) 100 MCG/2ML IJ SOLN
INTRAMUSCULAR | Status: AC
  Filled 2017-05-16: qty 2

## 2017-05-16 MED ORDER — LIDOCAINE HCL (PF) 2 % IJ SOLN
INTRAMUSCULAR | Status: AC
Start: 2017-05-16 — End: 2017-05-16
  Filled 2017-05-16: qty 2

## 2017-05-16 MED ORDER — SUCCINYLCHOLINE CHLORIDE 20 MG/ML IJ SOLN
INTRAMUSCULAR | Status: DC | PRN
  Administered 2017-05-16: 120 mg via INTRAVENOUS

## 2017-05-16 MED ORDER — MEPERIDINE HCL 50 MG/ML IJ SOLN: 6.2500 mg | INTRAMUSCULAR | Status: DC | PRN

## 2017-05-16 MED ORDER — SODIUM CHLORIDE 0.9 % IV SOLN
1.0000 g | Freq: Once | INTRAVENOUS | Status: AC
  Administered 2017-05-16: 1 g via INTRAVENOUS
  Filled 2017-05-16: qty 1

## 2017-05-16 MED ORDER — KETOROLAC TROMETHAMINE 30 MG/ML IJ SOLN
30.0000 mg | Freq: Four times a day (QID) | INTRAMUSCULAR | Status: DC
  Administered 2017-05-16 – 2017-05-17 (×2): 30 mg via INTRAVENOUS
  Filled 2017-05-16 (×2): qty 1

## 2017-05-16 MED ORDER — PHENYLEPHRINE HCL 10 MG/ML IJ SOLN
INTRAMUSCULAR | Status: AC
  Filled 2017-05-16: qty 1

## 2017-05-16 MED ORDER — PROPOFOL 10 MG/ML IV BOLUS
INTRAVENOUS | Status: AC
  Filled 2017-05-16: qty 20

## 2017-05-16 MED ORDER — HYDROCODONE-ACETAMINOPHEN 5-325 MG PO TABS
1.0000 | ORAL_TABLET | ORAL | Status: DC | PRN
  Administered 2017-05-17: 1 via ORAL
  Filled 2017-05-16: qty 1

## 2017-05-16 MED ORDER — PHENYLEPHRINE HCL 10 MG/ML IJ SOLN
INTRAMUSCULAR | Status: DC | PRN
  Administered 2017-05-16 (×2): 100 ug via INTRAVENOUS

## 2017-05-16 MED ORDER — HEPARIN SODIUM (PORCINE) 5000 UNIT/ML IJ SOLN
5000.0000 [IU] | Freq: Three times a day (TID) | INTRAMUSCULAR | Status: DC
  Administered 2017-05-17: 5000 [IU] via SUBCUTANEOUS
  Filled 2017-05-16: qty 1

## 2017-05-16 MED ORDER — FENTANYL CITRATE (PF) 100 MCG/2ML IJ SOLN
INTRAMUSCULAR | Status: AC
  Administered 2017-05-16: 25 ug via INTRAVENOUS
  Filled 2017-05-16: qty 2

## 2017-05-16 MED ORDER — ARTIFICIAL TEARS OPHTHALMIC OINT
TOPICAL_OINTMENT | OPHTHALMIC | Status: AC
  Filled 2017-05-16: qty 3.5

## 2017-05-16 MED ORDER — ACETAMINOPHEN 10 MG/ML IV SOLN
INTRAVENOUS | Status: AC
  Filled 2017-05-16: qty 100

## 2017-05-16 MED ORDER — ONDANSETRON HCL 4 MG/2ML IJ SOLN
4.0000 mg | Freq: Once | INTRAMUSCULAR | Status: AC
  Administered 2017-05-16: 4 mg via INTRAVENOUS
  Filled 2017-05-16: qty 2

## 2017-05-16 MED ORDER — ONDANSETRON HCL 4 MG/2ML IJ SOLN
INTRAMUSCULAR | Status: AC
  Filled 2017-05-16: qty 2

## 2017-05-16 MED ORDER — IOPAMIDOL (ISOVUE-300) INJECTION 61%
100.0000 mL | Freq: Once | INTRAVENOUS | Status: AC | PRN
  Administered 2017-05-16: 100 mL via INTRAVENOUS

## 2017-05-16 MED ORDER — SUGAMMADEX SODIUM 200 MG/2ML IV SOLN
INTRAVENOUS | Status: DC | PRN
  Administered 2017-05-16: 181.4 mg via INTRAVENOUS

## 2017-05-16 MED ORDER — LACTATED RINGERS IV SOLN
INTRAVENOUS | Status: DC | PRN
  Administered 2017-05-16: 20:00:00 via INTRAVENOUS

## 2017-05-16 MED ORDER — PANTOPRAZOLE SODIUM 40 MG PO TBEC
40.0000 mg | DELAYED_RELEASE_TABLET | Freq: Two times a day (BID) | ORAL | Status: DC
  Administered 2017-05-16 – 2017-05-17 (×2): 40 mg via ORAL
  Filled 2017-05-16 (×2): qty 1

## 2017-05-16 MED ORDER — BUPIVACAINE-EPINEPHRINE (PF) 0.25% -1:200000 IJ SOLN
INTRAMUSCULAR | Status: AC
Start: 2017-05-16 — End: 2017-05-16
  Filled 2017-05-16: qty 30

## 2017-05-16 MED ORDER — MIDAZOLAM HCL 2 MG/2ML IJ SOLN
INTRAMUSCULAR | Status: AC
  Filled 2017-05-16: qty 2

## 2017-05-16 SURGICAL SUPPLY — 36 items
APPLIER CLIP 5 13 M/L LIGAMAX5 (MISCELLANEOUS)
BLADE CLIPPER SURG (BLADE) ×3 IMPLANT
CANISTER SUCT 1200ML W/VALVE (MISCELLANEOUS) ×3 IMPLANT
CHLORAPREP W/TINT 26ML (MISCELLANEOUS) ×3 IMPLANT
CLIP APPLIE 5 13 M/L LIGAMAX5 (MISCELLANEOUS) IMPLANT
CUTTER FLEX LINEAR 45M (STAPLE) ×3 IMPLANT
DERMABOND ADVANCED (GAUZE/BANDAGES/DRESSINGS) ×2
DERMABOND ADVANCED .7 DNX12 (GAUZE/BANDAGES/DRESSINGS) ×1 IMPLANT
ELECT CAUTERY BLADE 6.4 (BLADE) ×3 IMPLANT
ELECT REM PT RETURN 9FT ADLT (ELECTROSURGICAL) ×3
ELECTRODE REM PT RTRN 9FT ADLT (ELECTROSURGICAL) ×1 IMPLANT
ENDOPOUCH RETRIEVER 10 (MISCELLANEOUS) ×3 IMPLANT
GLOVE BIO SURGEON STRL SZ7 (GLOVE) ×12 IMPLANT
GOWN STRL REUS W/ TWL LRG LVL3 (GOWN DISPOSABLE) ×2 IMPLANT
GOWN STRL REUS W/TWL LRG LVL3 (GOWN DISPOSABLE) ×4
IRRIGATION STRYKERFLOW (MISCELLANEOUS) ×1 IMPLANT
IRRIGATOR STRYKERFLOW (MISCELLANEOUS) ×3
IV NS 1000ML (IV SOLUTION) ×2
IV NS 1000ML BAXH (IV SOLUTION) ×1 IMPLANT
NEEDLE HYPO 22GX1.5 SAFETY (NEEDLE) ×3 IMPLANT
NS IRRIG 500ML POUR BTL (IV SOLUTION) ×3 IMPLANT
PACK LAP CHOLECYSTECTOMY (MISCELLANEOUS) ×3 IMPLANT
PENCIL ELECTRO HAND CTR (MISCELLANEOUS) ×3 IMPLANT
RELOAD 45 VASCULAR/THIN (ENDOMECHANICALS) ×3 IMPLANT
RELOAD STAPLE TA45 3.5 REG BLU (ENDOMECHANICALS) ×3 IMPLANT
SCALPEL HARMONIC ACE (MISCELLANEOUS) ×3 IMPLANT
SCISSORS METZENBAUM CVD 33 (INSTRUMENTS) ×3 IMPLANT
SLEEVE ENDOPATH XCEL 5M (ENDOMECHANICALS) ×3 IMPLANT
SPONGE LAP 18X18 5 PK (GAUZE/BANDAGES/DRESSINGS) ×3 IMPLANT
SUT MNCRL AB 4-0 PS2 18 (SUTURE) ×3 IMPLANT
SUT VICRYL 0 AB UR-6 (SUTURE) ×6 IMPLANT
SYR 20CC LL (SYRINGE) ×3 IMPLANT
TRAY FOLEY W/METER SILVER 16FR (SET/KITS/TRAYS/PACK) ×3 IMPLANT
TROCAR XCEL BLUNT TIP 100MML (ENDOMECHANICALS) ×3 IMPLANT
TROCAR XCEL NON-BLD 5MMX100MML (ENDOMECHANICALS) ×6 IMPLANT
TUBING INSUF HEATED (TUBING) ×3 IMPLANT

## 2017-05-16 NOTE — ED Notes (Signed)
Pt reports that nausea is better. She is still moaning every few seconds. She c/o pain 2/10 in her abdomen.

## 2017-05-16 NOTE — Anesthesia Preprocedure Evaluation (Signed)
Anesthesia Evaluation  Patient identified by MRN, date of birth, ID band Patient awake    Reviewed: Allergy & Precautions, NPO status , Patient's Chart, lab work & pertinent test results  History of Anesthesia Complications Negative for: history of anesthetic complications  Airway Mallampati: II  TM Distance: >3 FB Neck ROM: Full    Dental no notable dental hx.    Pulmonary neg sleep apnea, neg COPD, former smoker,    breath sounds clear to auscultation- rhonchi (-) wheezing      Cardiovascular Exercise Tolerance: Good (-) hypertension(-) CAD and (-) Past MI  Rhythm:Regular Rate:Normal - Systolic murmurs and - Diastolic murmurs    Neuro/Psych  Headaches, PSYCHIATRIC DISORDERS Anxiety Depression    GI/Hepatic negative GI ROS, (+) Hepatitis - (steatohepatitis)  Endo/Other  negative endocrine ROSneg diabetes  Renal/GU negative Renal ROS     Musculoskeletal negative musculoskeletal ROS (+)   Abdominal (+) + obese,   Peds  Hematology negative hematology ROS (+)   Anesthesia Other Findings Past Medical History: No date: Cancer [facility])     Comment: Breast Cancer left with lumpectomy No date: Chronic headache No date: Depression No date: History of abnormal cervical Pap smear     Comment: 09/24/2010- abnormal, HPV Positive, ASCUS No date: Impaired glucose tolerance No date: Mild dysplasia of cervix No date: Steatohepatitis   Reproductive/Obstetrics                             Anesthesia Physical Anesthesia Plan  ASA: III and emergent  Anesthesia Plan: General   Post-op Pain Management:    Induction: Intravenous and Rapid sequence  PONV Risk Score and Plan: 3 and Ondansetron, Dexamethasone, Propofol and Midazolam  Airway Management Planned: Oral ETT  Additional Equipment:   Intra-op Plan:   Post-operative Plan: Extubation in OR  Informed Consent: I have reviewed the patients  History and Physical, chart, labs and discussed the procedure including the risks, benefits and alternatives for the proposed anesthesia with the patient or authorized representative who has indicated his/her understanding and acceptance.   Dental advisory given  Plan Discussed with: CRNA and Anesthesiologist  Anesthesia Plan Comments:         Anesthesia Quick Evaluation

## 2017-05-16 NOTE — ED Notes (Addendum)
Signed consent for surgery placed on chart

## 2017-05-16 NOTE — H&P (Signed)
Patient ID: Kimberly Key, female   DOB: Apr 20, 1976, 41 y.o.   MRN: [id number]  HPI Kimberly Key is a 41 y.o. female with a history of breast cancer on the left status post mastectomy in 2015. She does have a history of metastatic breast cancer to the liver and his status post resection 2017 at [facility]. She comes today with abdominal pain nausea and vomiting. Reports that the pain was initially periumbilical and now has located in the right lower quadrant and lower abdomen. Pain is intermittent moderate to severe in intensity and sharp in nature. Patient had multiple episodes of emesis. She does not have an appetite. No fevers or chills. Pain started early this morning. Pain is worsening with movement. Further workup included a CT scan that I have personally reviewed there is evidence of dilated appendix with an appendicolith and some mild inflammatory changes. There is no evidence of an abscess or free air. There is no evidence of carcinomatosis. White count was slightly elevated and creatinine was normal.  HPI  Past Medical History:  Diagnosis Date  . Cancer [facility])    Breast Cancer left with lumpectomy  . Chronic headache   . Depression   . History of abnormal cervical Pap smear    09/24/2010- abnormal, HPV Positive, ASCUS  . Impaired glucose tolerance   . Mild dysplasia of cervix   . Steatohepatitis     Past Surgical History:  Procedure Laterality Date  . 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 MASTECTOMY Right 12/31/2013   [facility], Simple mastectomy with sentinal node biopsy    Family History  Problem Relation Age of Onset  . Diabetes Mother   . Gallbladder disease Mother   . Coronary artery disease Other   . Depression Other   . Cancer Other   . Seizures Other   . Anxiety disorder Father   . Gallbladder disease Sister   . Diabetes Paternal Uncle        type 2    Social History Social History  Substance Use Topics  .  Smoking status: Former Smoker    Packs/day: 0.75    Years: 5.00    Types: Cigarettes    Quit date: 06/04/2013  . Smokeless tobacco: Never Used  . Alcohol use No    Allergies  Allergen Reactions  . Bupropion Hcl     lightheadness  . Cephalexin Itching  . Latex Rash  . Oxycodone-Acetaminophen Anxiety and Rash    Dizzy;"mentally not right"; did not ease pain  . Penicillins Rash  . Promethazine Rash    Tremors  . Venlafaxine Rash    Patient states she is only able to take Desvenlafaxine    Current Facility-Administered Medications  Medication Dose Route Frequency Provider Last Rate Last Dose  . meropenem (MERREM) 1 g in sodium chloride 0.9 % 100 mL IVPB  1 g Intravenous Once Rudene Re, MD       Current Outpatient Prescriptions  Medication Sig Dispense Refill  . ARIPiprazole (ABILIFY) 10 MG tablet Take 10 mg by mouth daily.  1  . cyanocobalamin 1000 MCG tablet Take 1,000 mcg by mouth daily.    . Denosumab (XGEVA [place]) Inject 1 Dose into the skin. Every 45 days    . desvenlafaxine (PRISTIQ) 100 MG 24 hr tablet Take 1 tablet by mouth daily.    . fulvestrant (FASLODEX) 250 MG/5ML injection Inject 250 mg into the muscle every  30 (thirty) days. One injection each buttock over 1-2 minutes. Warm prior to use.    Marland Kitchen Leuprolide Acetate (LUPRON DEPOT, 31-MONTH, IM) Inject 1 Dose into the muscle every 30 (thirty) days.     . ondansetron (ZOFRAN) 4 MG tablet Take 1 tablet (4 mg total) by mouth every 8 (eight) hours as needed for nausea or vomiting. 60 tablet 4  . traMADol (ULTRAM) 50 MG tablet 1 or 2 tablets every 4 to 6 hours as needed for pain    . ketoconazole (NIZORAL) 2 % cream Apply 1 application topically daily. (Patient not taking: Reported on 05/16/2017) 60 g 0  . OLANZapine (ZYPREXA) 5 MG tablet Take 5 mg by mouth at bedtime.       Review of Systems Full ROS  was asked and was negative except for the information on the HPI  Physical Exam Blood pressure 133/77, pulse 72,  temperature 98.6 F (37 C), temperature source Oral, resp. rate 16, height 5\' 2"  (1.575 m), weight 90.7 kg (200 lb), SpO2 97 %. CONSTITUTIONAL: Non toxic, obese female in NAD. EYES: Pupils are equal, round, and reactive to light, Sclera are non-icteric. EARS, NOSE, MOUTH AND THROAT: The oropharynx is clear. The oral mucosa is pink and moist. Hearing is intact to voice. LYMPH NODES:  Lymph nodes in the neck are normal. RESPIRATORY:  Lungs are clear. There is normal respiratory effort, with equal breath sounds bilaterally, and without pathologic use of accessory muscles. CARDIOVASCULAR: Heart is regular without murmurs, gallops, or rubs. GI: The abdomen is  TTP RLQ + Mcburney's sign and focal rebound and peritonitis. Upper and lower midline laparotomy scars GU: Rectal deferred.   MUSCULOSKELETAL: Normal muscle strength and tone. No cyanosis or edema.   SKIN: Turgor is good and there are no pathologic skin lesions or ulcers. NEUROLOGIC: Motor and sensation is grossly normal. Cranial nerves are grossly intact. PSYCH:  Oriented to person, place and time. Affect is normal.  Data Reviewed I have personally reviewed the patient's imaging, laboratory findings and medical records.    Assessment/ Plan 41 year old female with classic signs and symptoms of appendicitis confirmed by CT scan. I do think that she will benefit from appendectomy. The risks, benefits, complications, treatment options, and expected outcomes were discussed with the patient. The treatment of antibiotics alone was discussed giving a 20% chance that this could fail and surgery would be necessary.  Also discussed continuing to the operating room for Laparoscopic Appendectomy.  The possibilities of  bleeding, recurrent infection, perforation of viscus, finding a normal appendix, the need for additional procedures, failure to diagnose a condition, conversion to open procedure and creating a complication requiring transfusion or further  operations were discussed. The patient was given the opportunity to ask questions and have them answered.  Patient would like to proceed with Laparoscopic Appendectomy and consent was obtained.  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 We'll obviously graft her wishes. Discussed with her in detail specific risk associated with not being able to be transfused. She understands and she wishes to proceed. We'll go ahead and start broad-spectrum antibiotic therapy and crystalloids   Caroleen Hamman, MD FACS General Surgeon 05/16/2017, 7:40 PM

## 2017-05-16 NOTE — Anesthesia Procedure Notes (Signed)
Procedure Name: Intubation Date/Time: 05/16/2017 8:17 PM Performed by: Jennette Bill Pre-anesthesia Checklist: Patient identified, Patient being monitored, Timeout performed, Emergency Drugs available and Suction available Patient Re-evaluated:Patient Re-evaluated prior to inductionOxygen Delivery Method: Circle system utilized Preoxygenation: Pre-oxygenation with 100% oxygen Intubation Type: IV induction Ventilation: Mask ventilation without difficulty Laryngoscope Size: Mac and 3 Grade View: Grade I Tube type: Oral Tube size: 7.0 mm Number of attempts: 1 Airway Equipment and Method: Stylet Placement Confirmation: ETT inserted through vocal cords under direct vision,  positive ETCO2 and breath sounds checked- equal and bilateral Secured at: 21 cm Tube secured with: Tape Dental Injury: Teeth and Oropharynx as per pre-operative assessment

## 2017-05-16 NOTE — Anesthesia Post-op Follow-up Note (Cosign Needed)
Anesthesia QCDR form completed.        

## 2017-05-16 NOTE — ED Notes (Addendum)
Pt signed refusal for blood document and placed with chart along with pt's consent for surgery. Surgical RN notified of pt's refusal for blood products. Pt is A&Ox4 at this time. Ordered antibiotic sent with pt by orderly.  Pt's belongings with pt's mother who accompanied pt to surgery waiting area.

## 2017-05-16 NOTE — ED Notes (Signed)
Pt assisted to toilet 

## 2017-05-16 NOTE — Op Note (Signed)
laparascopic appendectomy   Kimberly Key Date of operation:  05/16/2017  Indications: The patient presented with a history of  abdominal pain. Workup has revealed findings consistent with acute appendicitis.  Pre-operative Diagnosis: Appendicitis, unqualified  Post-operative Diagnosis: Same  Surgeon: Caroleen Hamman, MD, FACS  Anesthesia: General with endotracheal tube  Findings: non perforated appendicitis  Estimated Blood Loss: 10cc         Specimens: appendix         Complications:  none  Procedure Details  The patient was seen again in the preop area. The options of surgery versus observation were reviewed with the patient and/or family. The risks of bleeding, infection, recurrence of symptoms, negative laparoscopy, potential for an open procedure, bowel injury, abscess or infection, were all reviewed as well. The patient was taken to Operating Room, identified as Kimberly Key and the procedure verified as laparoscopic appendectomy. A Time Out was held and the above information confirmed.  The patient was placed in the supine position and general anesthesia was induced.  Antibiotic prophylaxis was administered and VT E prophylaxis was in place. A Foley catheter was placed by the nursing staff.   The abdomen was prepped and draped in a sterile fashion. An infraumbilical incision was made. A cutdown technique was used to enter the abdominal cavity. Two vicryl stitches were placed on the fascia and a Hasson trocar inserted. Pneumoperitoneum obtained. Two 5 mm ports were placed under direct visualization.   The appendix was identified and found to be acutely inflamed  The appendix was carefully dissected. The mesoappendix was divided withHarmonic scalpel. The base of the appendix was dissected out and divided with a standard load Endo GIA.The appendix was placed in a Endo Catch bag and removed via the Hasson port. The right lower quadrant and pelvis was then irrigated with  normal  saline which was aspirated. Inspection  failed to identify any additional bleeding and there were no signs of bowel injury. Again the right lower quadrant was inspected there was no sign of bleeding or bowel injury therefore pneumoperitoneum was released, all ports were removed.  The umbilical fascia was closed with 0 Vicryl interrupted sutures and the skin incisions were approximated with subcuticular 4-0 Monocryl. Dermabond was placed The patient tolerated the procedure well, there were no complications. The sponge lap and needle count were correct at the end of the procedure.  The patient was taken to the recovery room in stable condition to be admitted for continued care.    Caroleen Hamman, MD FACS

## 2017-05-16 NOTE — Transfer of Care (Signed)
Immediate Anesthesia Transfer of Care Note  Patient: Kimberly Key  Procedure(s) Performed: Procedure(s): APPENDECTOMY LAPAROSCOPIC (N/A)  Patient Location: PACU  Anesthesia Type:General  Level of Consciousness: awake, alert  and oriented  Airway & Oxygen Therapy: Patient Spontanous Breathing and Patient connected to face mask oxygen  Post-op Assessment: Report given to RN and Post -op Vital signs reviewed and stable  Post vital signs: Reviewed and stable  Last Vitals:  Vitals:   05/16/17 1940 05/16/17 2107  BP:  (!) 137/92  Pulse: 87 (!) 114  Resp:  18  Temp:  36.9 C    Last Pain:  Vitals:   05/16/17 2107  TempSrc:   PainSc: Asleep         Complications: No apparent anesthesia complications

## 2017-05-16 NOTE — ED Triage Notes (Signed)
Pt arrives via [facility] from home for N&V that began today around 7am. EMS reports throwing up at least once an hour, pt states more times. Pt states peri umbilicus abd pain but not terrible, just aggravating. Pt states hadn't had BM in few days so took a stool softener and had BM after that. Vomiting is yellow fluid, last meal was cracker barrel last night. Pt moaning in and rolling around in bed. Pt asked for pillow and blanket upon arrival.   Pt had L mastectomy in 2015. Still has gallbladder and appendix. Alert and oriented.

## 2017-05-16 NOTE — ED Provider Notes (Addendum)
[facility] Emergency Department Provider Note  ____________________________________________  Time seen: Approximately 4:07 PM  I have reviewed the triage vital signs and the nursing notes.   HISTORY  Chief Complaint Emesis   HPI Kimberly Key is a 41 y.o. female with a history of metastatic breast cancer to the liver on chemotherapy status post liver resection, depression, polycystic ovarian syndrome who presents for evaluation of nausea and vomiting. Patient reports that she has had hourly episodes of nonbloody nonbilious emesis since this morning associated with severe nausea. Her last chemotherapy was a week ago. She endorses some nausea and vomiting after chemotherapy but usually not this far out from a treatment. She also has had mild 2/10 pressure-like pain located around her umbilical area since this morning. Patient reports that she was constipatedfor a few days but took a stool softener yesterday and had 2 bowel movements today. She is passing flatus. No prior history of SBO. No other abdominal surgeries. Her liver resection was a year ago. She denies fever but has had chills area and no cough, congestion, sore throat, headache, dysuria, hematuria.  Past Medical History:  Diagnosis Date  . Cancer [facility])    Breast Cancer left with lumpectomy  . Chronic headache   . Depression   . History of abnormal cervical Pap smear    09/24/2010- abnormal, HPV Positive, ASCUS  . Impaired glucose tolerance   . Mild dysplasia of cervix   . Steatohepatitis     Patient Active Problem List   Diagnosis Date Noted  . Acute appendicitis with localized peritonitis   . Alteration consciousness 06/14/2016  . Nausea 05/13/2016  . Low back pain 05/13/2016  . Pre-syncope 05/13/2016  . Adenocarcinoma of left breast ([place]) 05/04/2016  . Bradycardia 05/04/2016  . Chronic headache 05/04/2016  . History of methicillin resistant Staphylococcus aureus infection 05/04/2016  .  Impaired glucose tolerance 05/04/2016  . Tinea versicolor 05/04/2016  . Panic attack as reaction to stress 05/04/2016  . Bilateral polycystic ovarian syndrome 05/04/2016  . Steatohepatitis 05/04/2016  . Mild cervical dysplasia 05/04/2016  . Urinary tract infection 08/14/2015  . Major depressive disorder, recurrent severe without psychotic features ([place]) 05/07/2015  . Breast CA ([place]) 12/11/2013  . Breast lump 11/25/2013    Past Surgical History:  Procedure Laterality Date  . 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 MASTECTOMY Right 12/31/2013   [facility], Simple mastectomy with sentinal node biopsy    Prior to Admission medications   Medication Sig Start Date End Date Taking? Authorizing Provider  ARIPiprazole (ABILIFY) 10 MG tablet Take 10 mg by mouth daily. 04/20/17  Yes [provider]  cyanocobalamin 1000 MCG tablet Take 1,000 mcg by mouth daily.   Yes [provider]  Denosumab (XGEVA [place]) Inject 1 Dose into the skin. Every 45 days   Yes [provider]  desvenlafaxine (PRISTIQ) 100 MG 24 hr tablet Take 1 tablet by mouth daily. 11/03/15  Yes [provider]  fulvestrant (FASLODEX) 250 MG/5ML injection Inject 250 mg into the muscle every 30 (thirty) days. One injection each buttock over 1-2 minutes. Warm prior to use.   Yes [provider]  Leuprolide Acetate (LUPRON DEPOT, 67-MONTH, IM) Inject 1 Dose into the muscle every 30 (thirty) days.    Yes [provider]  ondansetron (ZOFRAN) 4 MG tablet Take 1 tablet (4 mg total) by mouth every 8 (eight) hours as needed for nausea  or vomiting. 05/13/16  Yes Birdie Sons, MD  traMADol Veatrice [place]) 50 MG tablet 1 or 2 tablets every 4 to 6 hours as needed for pain 12/26/16  Yes [provider]  ketoconazole (NIZORAL) 2 % cream Apply 1 application topically daily. Patient not taking: Reported on 05/16/2017 12/30/16   Birdie Sons, MD  OLANZapine  (ZYPREXA) 5 MG tablet Take 5 mg by mouth at bedtime. 12/15/16 01/14/17  [provider]    Allergies Bupropion hcl; Cephalexin; Latex; Oxycodone-acetaminophen; Penicillins; Promethazine; and Venlafaxine  Family History  Problem Relation Age of Onset  . Diabetes Mother   . Gallbladder disease Mother   . Coronary artery disease Other   . Depression Other   . Cancer Other   . Seizures Other   . Anxiety disorder Father   . Gallbladder disease Sister   . Diabetes Paternal Uncle        type 2    Social History Social History  Substance Use Topics  . Smoking status: Former Smoker    Packs/day: 0.75    Years: 5.00    Types: Cigarettes    Quit date: 06/04/2013  . Smokeless tobacco: Never Used  . Alcohol use No    Review of Systems  Constitutional: Negative for fever. + chills Eyes: Negative for visual changes. ENT: Negative for sore throat. Neck: No neck pain  Cardiovascular: Negative for chest pain. Respiratory: Negative for shortness of breath. Gastrointestinal: + periumbilical abdominal pain, nausea, and vomiting. No diarrhea. Genitourinary: Negative for dysuria. Musculoskeletal: Negative for back pain. Skin: Negative for rash. Neurological: Negative for headaches, weakness or numbness. Psych: No SI or HI  ____________________________________________   PHYSICAL EXAM:  VITAL SIGNS: ED Triage Vitals  Enc Vitals Group     BP 05/16/17 1517 (!) 153/93     Pulse Rate 05/16/17 1517 97     Resp 05/16/17 1517 20     Temp 05/16/17 1517 98.6 F (37 C)     Temp Source 05/16/17 1517 Oral     SpO2 05/16/17 1517 98 %     Weight 05/16/17 1519 200 lb (90.7 kg)     Height 05/16/17 1519 5\' 2"  (1.575 m)     Head Circumference --      Peak Flow --      Pain Score 05/16/17 1517 5     Pain Loc --      Pain Edu? --      Excl. in [place]? --     Constitutional: Alert and oriented. Well appearing and in no apparent distress. HEENT:      Head: Normocephalic and atraumatic.           Eyes: Conjunctivae are normal. Sclera is non-icteric.       Mouth/Throat: Mucous membranes are dry.       Neck: Supple with no signs of meningismus. Cardiovascular: Regular rate and rhythm. No murmurs, gallops, or rubs. 2+ symmetrical distal pulses are present in all extremities. No JVD. Respiratory: Normal respiratory effort. Lungs are clear to auscultation bilaterally. No wheezes, crackles, or rhonchi.  Gastrointestinal: Soft, mild periumbilical ttp, no RLQ or LLQ ttp, and non distended with positive bowel sounds. No rebound or guarding. Genitourinary: No CVA tenderness. Musculoskeletal: Nontender with normal range of motion in all extremities. No edema, cyanosis, or erythema of extremities. Neurologic: Normal speech and language. Face is symmetric. Moving all extremities. No gross focal neurologic deficits are appreciated. Skin: Skin is warm, dry and intact. No rash noted. Psychiatric: Mood and  affect are normal. Speech and behavior are normal.  ____________________________________________   LABS (all labs ordered are listed, but only abnormal results are displayed)  Labs Reviewed  COMPREHENSIVE METABOLIC PANEL - Abnormal; Notable for the following:       Result Value   Glucose, Bld 133 (*)    All other components within normal limits  URINALYSIS, COMPLETE (UACMP) WITH MICROSCOPIC - Abnormal; Notable for the following:    Color, Urine YELLOW (*)    APPearance HAZY (*)    Ketones, ur 5 (*)    Protein, ur 30 (*)    Squamous Epithelial / LPF 0-5 (*)    All other components within normal limits  CBC WITH DIFFERENTIAL/PLATELET - Abnormal; Notable for the following:    Neutro Abs 9.7 (*)    Lymphs Abs 0.6 (*)    All other components within normal limits  LIPASE, BLOOD  HCG, QUANTITATIVE, PREGNANCY  PROTIME-INR  APTT   ____________________________________________  EKG  none  ____________________________________________  RADIOLOGY  CT a/p;  1. The appendix contains an  appendicolith and is mildly dilated measuring up to 12 mm in diameter with a trace amount of periappendiceal fluid, concerning for early acute appendicitis. 2. Unusual appearance of the lesser curvature of the proximal stomach which appears adherent to the surface of the left lobe of the liver where there are some suture lines. The possibility of adhesions in this region is suspected. At this time, this does not appear to be associated with obstruction of the proximal stomach. 3. Aortic atherosclerosis. 4. Pelvic floor laxity with cystocele. 5. Additional incidental findings, as above. ____________________________________________   PROCEDURES  Procedure(s) performed: None Procedures Critical Care performed:  None ____________________________________________   INITIAL IMPRESSION / ASSESSMENT AND PLAN / ED COURSE   41 y.o. female with a history of metastatic breast cancer to the liver on chemotherapy status post liver resection, depression, polycystic ovarian syndrome who presents for evaluation of nausea and vomiting since this am associated with mild periumbilical pain. Patient looks dry on exam with dry mucous membranes but has normal vital signs, her abdomen is soft and nondistended, she has normal bowel sounds, she has mild tenderness to palpation the periumbilical region with no left lower quadrant or right lower quadrant tenderness on exam. Differential diagnoses including gastroenteritis versus appendicitis versus side effect of chemotherapy versus UTI. Patient has no lower abdominal tenderness therefore don't believe this pain is coming from ovarian etiology. We'll check basic blood work to rule out dehydration electrolyte abnormalities. We'll give IV fluids and Zofran, will check UA and pregnancy test. We'll send patient for CT abdomen and pelvis.  Clinical Course as of May 17 2039  Tue May 16, 2017  1859 CT concerning for early appendicitis. We'll discuss with surgery. We'll give  IV antibiotics.  [CV]    Clinical Course User Index [CV] Rudene Re, MD    Pertinent labs & imaging results that were available during my care of the patient were reviewed by me and considered in my medical decision making (see chart for details).    ____________________________________________   FINAL CLINICAL IMPRESSION(S) / ED DIAGNOSES  Final diagnoses:  Other acute appendicitis      NEW MEDICATIONS STARTED DURING THIS VISIT:  Current Discharge Medication List       Note:  This document was prepared using Dragon voice recognition software and may include unintentional dictation errors.    Rudene Re, MD 05/16/17 2039    Rudene Re, MD 05/16/17 2040

## 2017-05-17 ENCOUNTER — Encounter: Payer: Self-pay | Admitting: Surgery

## 2017-05-17 DIAGNOSIS — K381 Appendicular concretions: Secondary | ICD-10-CM | POA: Diagnosis not present

## 2017-05-17 LAB — MRSA PCR SCREENING: MRSA by PCR: NEGATIVE

## 2017-05-17 MED ORDER — HYDROCODONE-ACETAMINOPHEN 5-325 MG PO TABS: 1.0000 | ORAL_TABLET | ORAL | 0 refills | Status: DC | PRN

## 2017-05-17 NOTE — Discharge Instructions (Signed)
Laparoscopic Appendectomy, Adult, Care After °These instructions give you information about caring for yourself after your procedure. Your doctor may also give you more specific instructions. Call your doctor if you have any problems or questions after your procedure. °Follow these instructions at home: °Medicines °· Take over-the-counter and prescription medicines only as told by your doctor. °· Do not drive for 24 hours if you received a sedative. °· Do not drive or use heavy machinery while taking prescription pain medicine. °· If you were prescribed an antibiotic medicine, take it as told by your doctor. Do not stop taking it even if you start to feel better. °Activity °· Do not lift anything that is heavier than 10 pounds (4.5 kg) for 3 weeks or as told by your doctor. °· Do not play contact sports for 3 weeks or as told by your doctor. °· Slowly return to your normal activities. °Bathing °· Keep your cuts from surgery (incisions) clean and dry. °? Gently wash the cuts with soap and water. °? Rinse the cuts with water until the soap is gone. °? Pat the cuts dry with a clean towel. Do not rub the cuts. °· You may take showers after 48 hours. °· Do not take baths, swim, or use a hot tub for 2 weeks or as told by your doctor. °Cut Care °· Follow instructions from your doctor about how to take care of your cuts. Make sure you: °? Wash your hands with soap and water before you change your bandage (dressing). If you do not have soap and water, use hand sanitizer. °? Change your bandage as told by your doctor. °? Leave stitches (sutures), skin glue, or skin tape (adhesive) strips in place. They may need to stay in place for 2 weeks or longer. If tape strips get loose and curl up, you may trim the loose edges. Do not remove tape strips completely unless your doctor says it is okay. °· Check your cuts every day for signs of infection. Check for: °? More redness, swelling, or pain. °? More fluid or  blood. °? Warmth. °? Pus or a bad smell. °Other Instructions °· If you were sent home with a drain, follow instructions from your doctor about how to use it and care for it. °· Take deep breaths. This helps to keep your lungs from getting swollen (inflamed). °· To help with constipation: °? Drink plenty of fluids. °? Eat plenty of fruits and vegetables. °· Keep all follow-up visits as told by your doctor. This is important. °Contact a doctor if: °· You have more redness, swelling, or pain around a cut from surgery. °· You have more fluid or blood coming from a cut. °· Your cut feels warm to the touch. °· You have pus or a bad smell coming from a cut or a bandage. °· The edges of a cut break open after the stitches have been taken out. °· You have pain in your shoulders that gets worse. °· You feel dizzy or you pass out (faint). °· You have shortness of breath. °· You keep feeling sick to your stomach (nauseous). °· You keep throwing up (vomiting). °· You get diarrhea or you cannot control your poop. °· You lose your appetite. °· You have swelling or pain in your legs. °Get help right away if: °· You have a fever. °· You get a rash. °· You have trouble breathing. °· You have sharp pains in your chest. °This information is not intended to replace advice given   to you by your health care provider. Make sure you discuss any questions you have with your health care provider. °Document Released: 09/17/2009 Document Revised: 04/28/2016 Document Reviewed: 05/11/2015 °Elsevier Interactive Patient Education © 2018 [employer] ° °

## 2017-05-17 NOTE — Anesthesia Postprocedure Evaluation (Signed)
Anesthesia Post Note  Patient: Kimberly Key  Procedure(s) Performed: Procedure(s) (LRB): APPENDECTOMY LAPAROSCOPIC (N/A)  Patient location during evaluation: PACU Anesthesia Type: General Level of consciousness: awake and alert and oriented Pain management: pain level controlled Vital Signs Assessment: post-procedure vital signs reviewed and stable Respiratory status: spontaneous breathing, nonlabored ventilation and respiratory function stable Cardiovascular status: blood pressure returned to baseline and stable Postop Assessment: no signs of nausea or vomiting Anesthetic complications: no     Last Vitals:  Vitals:   05/16/17 2229 05/16/17 2305  BP: (!) 113/59 113/71  Pulse: (!) 104 (!) 110  Resp: 20 18  Temp: 36.8 C 36.9 C    Last Pain:  Vitals:   05/16/17 2305  TempSrc: Oral  PainSc:                  Neri Samek

## 2017-05-17 NOTE — Progress Notes (Signed)
1 Day Post-Op   Subjective:  Patient doing very well first day after laparoscopic appendectomy. Having some pain in her incision sites otherwise doing fantastic. Pain currently controlled. She has not taken oral pain medication yet. Tolerating a diet.  Vital signs in last 24 hours: Temp:  [98.2 F (36.8 C)-98.6 F (37 C)] 98.6 F (37 C) (06/13 0816) Pulse Rate:  [63-116] 106 (06/13 0816) Resp:  [14-20] 18 (06/13 0816) BP: (113-153)/(59-93) 127/72 (06/13 0816) SpO2:  [96 %-100 %] 97 % (06/13 0816) Weight:  [90.7 kg (200 lb)-95.9 kg (211 lb 8 oz)] 95.9 kg (211 lb 8 oz) (06/12 2229)    Intake/Output from previous day: 06/12 0701 - 06/13 0700 In: 1804 [I.V.:804; IV Piggyback:1000] Out: 1105 [Urine:1100; Blood:5]  GI: Abdomen soft, appropriately tender the incision sites, nondistended. Well approximated laparoscopic incisions without evidence of erythema or drainage.  Lab Results:  CBC  Recent Labs  05/16/17 1559  WBC 10.9  HGB 14.2  HCT 41.9  PLT 202   CMP     Component Value Date/Time   NA 140 05/16/2017 1520   NA 144 04/08/2015   NA 139 03/11/2014 0648   K 4.1 05/16/2017 1520   K 3.6 03/11/2014 0648   CL 105 05/16/2017 1520   CL 108 (H) 03/11/2014 0648   CO2 28 05/16/2017 1520   CO2 25 03/11/2014 0648   GLUCOSE 133 (H) 05/16/2017 1520   GLUCOSE 123 (H) 03/11/2014 0648   BUN 18 05/16/2017 1520   BUN 12 04/08/2015   BUN 16 03/11/2014 0648   CREATININE 0.56 05/16/2017 1520   CREATININE 0.63 03/11/2014 0648   CALCIUM 9.4 05/16/2017 1520   CALCIUM 8.0 (L) 03/11/2014 0648   PROT 7.3 05/16/2017 1520   PROT 6.2 (L) 03/10/2014 0657   ALBUMIN 4.1 05/16/2017 1520   ALBUMIN 3.4 03/10/2014 0657   AST 30 05/16/2017 1520   AST 15 03/10/2014 0657   ALT 28 05/16/2017 1520   ALT 45 03/10/2014 0657   ALKPHOS 71 05/16/2017 1520   ALKPHOS 74 03/10/2014 0657   BILITOT 0.5 05/16/2017 1520   BILITOT 0.6 03/10/2014 0657   GFRNONAA >60 05/16/2017 1520   GFRNONAA >60  03/11/2014 0648   GFRAA >60 05/16/2017 1520   GFRAA >60 03/11/2014 0648   PT/INR  Recent Labs  05/16/17 2230  LABPROT 14.2  INR 1.10    Studies/Results: Ct Abdomen Pelvis W Contrast  Result Date: 05/16/2017 CLINICAL DATA:  41 year old female with history of nausea and vomiting since 7 a.m. today. Periumbilical abdominal pain. EXAM: CT ABDOMEN AND PELVIS WITH CONTRAST TECHNIQUE: Multidetector CT imaging of the abdomen and pelvis was performed using the standard protocol following bolus administration of intravenous contrast. CONTRAST:  166mL ISOVUE-300 IOPAMIDOL (ISOVUE-300) INJECTION 61% COMPARISON:  No priors. FINDINGS: Lower chest: Unremarkable. Hepatobiliary: Suture line along the surface of the left lobe of the liver, suggesting prior partial hepatectomy. No definite cystic or solid hepatic lesions. No intra or extrahepatic biliary ductal dilatation. Gallbladder is normal in appearance. Pancreas: No pancreatic mass. No pancreatic ductal dilatation. No pancreatic or peripancreatic fluid or inflammatory changes. Spleen: Unremarkable. Adrenals/Urinary Tract: Bilateral kidneys and bilateral adrenal glands are normal in appearance. No hydroureteronephrosis. Pelvic floor laxity with low-lying urinary bladder indicative of a cystocele. Stomach/Bowel: Stomach appears distorted, with the lesser curvature immediately abutting the suture line along the surface of the left lobe of the liver, potentially related to adhesions. No pathologic dilatation of small bowel or colon. Appendix is mildly dilated measuring up  to 12 mm, within appendicolith present in the lumen. Trace amount of periappendiceal fluid. No para appendiceal abscess. Vascular/Lymphatic: Aortic atherosclerosis, without evidence of aneurysm or dissection in the abdominal or pelvic vasculature. No lymphadenopathy noted in the abdomen or pelvis. Reproductive: Status posthysterectomy. Ovaries are unremarkable in appearance. Other: No significant  volume of ascites.  No pneumoperitoneum. Musculoskeletal: There are no aggressive appearing lytic or blastic lesions noted in the visualized portions of the skeleton. IMPRESSION: 1. The appendix contains an appendicolith and is mildly dilated measuring up to 12 mm in diameter with a trace amount of periappendiceal fluid, concerning for early acute appendicitis. 2. Unusual appearance of the lesser curvature of the proximal stomach which appears adherent to the surface of the left lobe of the liver where there are some suture lines. The possibility of adhesions in this region is suspected. At this time, this does not appear to be associated with obstruction of the proximal stomach. 3. Aortic atherosclerosis. 4. Pelvic floor laxity with cystocele. 5. Additional incidental findings, as above. Electronically Signed   By: Vinnie Langton M.D.   On: 05/16/2017 18:32    Assessment/Plan: 41 year old female status post laparoscopic appendectomy. Doing well. Encourage ambulation and incentive spirometer usage. Plan for discharge home later today once it is confirmed the pain is controlled on oral agents.   Clayburn Pert, MD [facility]  [facility] [phone] [facility] [phone]  05/17/2017

## 2017-05-17 NOTE — Discharge Summary (Signed)
Patient ID: AMEILA WELDON MRN: [id number] DOB/AGE: [birth year] 41 y.o.  Admit date: 05/16/2017 Discharge date: 05/17/2017  Discharge Diagnoses:  appendicitis  Procedures Performed: Laparoscopic appendectomy  Discharged Condition: good  Hospital Course: Underwent uneventful appendectomy. Abdomen benign and able to be discharged home on POD #1.  Discharge Orders: Discharge Instructions    Call MD for:  difficulty breathing, headache or visual disturbances    Complete by:  As directed    Call MD for:  hives    Complete by:  As directed    Call MD for:  persistant nausea and vomiting    Complete by:  As directed    Call MD for:  redness, tenderness, or signs of infection (pain, swelling, redness, odor or green/yellow discharge around incision site)    Complete by:  As directed    Call MD for:  severe uncontrolled pain    Complete by:  As directed    Call MD for:  temperature >100.4    Complete by:  As directed    Diet - low sodium heart healthy    Complete by:  As directed    Increase activity slowly    Complete by:  As directed       Disposition:   Discharge Medications: Allergies as of 05/17/2017      Reactions   Bupropion Hcl    lightheadness   Cephalexin Itching   Latex Rash   Oxycodone-acetaminophen Anxiety, Rash   Dizzy;"mentally not right"; did not ease pain   Penicillins Rash   Promethazine Rash   Tremors   Venlafaxine Rash   Patient states she is only able to take Desvenlafaxine      Medication List    TAKE these medications   ARIPiprazole 10 MG tablet Commonly known as:  ABILIFY Take 10 mg by mouth daily.   cyanocobalamin 1000 MCG tablet Take 1,000 mcg by mouth daily.   FASLODEX 250 MG/5ML injection Generic drug:  fulvestrant Inject 250 mg into the muscle every 30 (thirty) days. One injection each buttock over 1-2 minutes. Warm prior to use.   HYDROcodone-acetaminophen 5-325 MG tablet Commonly known as:  NORCO/VICODIN Take 1-2 tablets by  mouth every 4 (four) hours as needed for moderate pain.   ketoconazole 2 % cream Commonly known as:  NIZORAL Apply 1 application topically daily.   LUPRON DEPOT (83-MONTH) IM Inject 1 Dose into the muscle every 30 (thirty) days.   OLANZapine 5 MG tablet Commonly known as:  ZYPREXA Take 5 mg by mouth at bedtime.   ondansetron 4 MG tablet Commonly known as:  ZOFRAN Take 1 tablet (4 mg total) by mouth every 8 (eight) hours as needed for nausea or vomiting.   PRISTIQ 100 MG 24 hr tablet Generic drug:  desvenlafaxine Take 1 tablet by mouth daily.   traMADol 50 MG tablet Commonly known as:  ULTRAM 1 or 2 tablets every 4 to 6 hours as needed for pain   XGEVA [place] Inject 1 Dose into the skin. Every 45 days        Follwup: Follow-up Information    P[place], [place] F, MD. Schedule an appointment as soon as possible for a visit in 1 week(s).   Specialty:  General Surgery Why:  postop Contact information: [place] [address] [phone]           Signed: Clayburn Pert 05/17/2017, 11:37 AM

## 2017-05-17 NOTE — Progress Notes (Signed)
Nutrition Brief Note  Patient identified on the Malnutrition Screening Tool (MST) Report  Wt Readings from Last 15 Encounters:  05/16/17 211 lb 8 oz (95.9 kg)  12/30/16 203 lb (92.1 kg)  09/01/16 190 lb 6.4 oz (86.4 kg)  06/14/16 194 lb 6.4 oz (88.2 kg)  06/09/16 189 lb (85.7 kg)  05/13/16 195 lb (88.5 kg)  08/14/15 191 lb (64.5 kg)   41 year old female with PMHx of depression, breast cancer s/p mastectomy 12/31/2013 and chemoradiation who presented with abdominal pain, nausea, vomiting and was found to have appendicitis. Now s/p laparoscopic appendectomy on 05/16/2017.   Spoke with patient at bedside. She reports she recently had unintentional weight changes related to medication changes. Patient follows Psychiatry at [facility] and had been put on Zyprexa. She reports she gained weight unintentionally on that medication. She was then switched to Ability and reports that her weight is starting to go back down now. Reports she had been up to as high as 220 lbs on Zyprexa. UBW around 195-200 lbs. Patient reports she has continued to have a good appetite and eats 100% of meals. Tolerated her first meal this morning after operation yesterday.  Nutrition-Focused physical exam completed. Findings are no fat depletion, no muscle depletion, and no edema.   Patient does not meet the criteria for malnutrition at this time.  Body mass index is 38.68 kg/m. Patient meets criteria for Obesity Class II based on current BMI.   Current diet order is Regular, patient is consuming approximately 75% of meals at this time (per her report). Reports she is tolerating diet well already. Labs and medications reviewed.   No nutrition interventions warranted at this time. If nutrition issues arise, please consult RD.   Willey Blade, MS, RD, LDN Pager: [phone] After Hours Pager: [phone]

## 2017-05-18 LAB — HIV ANTIBODY (ROUTINE TESTING W REFLEX): HIV Screen 4th Generation wRfx: NONREACTIVE

## 2017-05-19 ENCOUNTER — Other Ambulatory Visit: Payer: Self-pay

## 2017-05-22 LAB — SURGICAL PATHOLOGY

## 2017-05-23 ENCOUNTER — Other Ambulatory Visit: Payer: Self-pay

## 2017-05-23 ENCOUNTER — Encounter: Payer: Self-pay | Admitting: Surgery

## 2017-05-23 ENCOUNTER — Ambulatory Visit (INDEPENDENT_AMBULATORY_CARE_PROVIDER_SITE_OTHER): Payer: Medicaid Other | Admitting: Surgery

## 2017-05-23 VITALS — BP 141/90 | HR 78 | Temp 98.3°F | Ht 62.0 in | Wt 205.0 lb

## 2017-05-23 DIAGNOSIS — Z09 Encounter for follow-up examination after completed treatment for conditions other than malignant neoplasm: Secondary | ICD-10-CM

## 2017-05-23 NOTE — Progress Notes (Signed)
S/p lap appy Path d/w pt in detail She feels well, no major complaints only mild pain around periumbilical incision Tolerating Po Very appreciative  PE NAD Abd: soft, Nt, incisions c/d/i  A/P Doing well No complications No heavy lifting RTC prn

## 2017-05-23 NOTE — Patient Instructions (Signed)
Please call our office with any questions or concerns.  Please do not submerge in a tub, hot tub, or pool until incisions are completely sealed.  Use sun block to incision area over the next year if this area will be exposed to sun. This helps decrease scarring.  You may resume your normal activities on 06/27/17. At that time- Listen to your body when lifting, if you have pain when lifting, stop and then try again in a few days. Pain after doing exercises or activities of daily living is normal as you get back in to your normal routine.  If you develop redness, drainage, or pain at incision sites- call our office immediately and speak with a nurse.

## 2017-05-25 ENCOUNTER — Encounter: Payer: Medicaid Other | Admitting: Surgery

## 2017-06-05 ENCOUNTER — Encounter: Payer: Self-pay | Admitting: Family Medicine

## 2017-06-05 ENCOUNTER — Ambulatory Visit (INDEPENDENT_AMBULATORY_CARE_PROVIDER_SITE_OTHER): Payer: Medicare Other | Admitting: Family Medicine

## 2017-06-05 VITALS — BP 130/90 | HR 83 | Temp 98.2°F | Resp 16 | Wt 203.2 lb

## 2017-06-05 DIAGNOSIS — J069 Acute upper respiratory infection, unspecified: Secondary | ICD-10-CM | POA: Diagnosis not present

## 2017-06-05 DIAGNOSIS — R062 Wheezing: Secondary | ICD-10-CM

## 2017-06-05 MED ORDER — DOXYCYCLINE HYCLATE 100 MG PO TABS: 100.0000 mg | ORAL_TABLET | Freq: Two times a day (BID) | ORAL | 0 refills | Status: DC

## 2017-06-05 MED ORDER — ALBUTEROL SULFATE HFA 108 (90 BASE) MCG/ACT IN AERS: 2.0000 | INHALATION_SPRAY | Freq: Four times a day (QID) | RESPIRATORY_TRACT | 2 refills | Status: AC | PRN

## 2017-06-05 NOTE — Patient Instructions (Signed)
Continue Mucinex D and add Delsym for cough. If sinuses not improving by the end of the week or shortness of breath/cough worsens over the next few days to start the antibiotic.

## 2017-06-05 NOTE — Progress Notes (Signed)
Subjective:     Patient ID: Kimberly Key, female   DOB: 1976-04-02, 41 y.o.   MRN: [id number]  HPI  Chief Complaint  Patient presents with  . Cough    Patient comes inb office today today with complaints of cough and congestion for the past 3 days. Patient states her symptoms initially started of as sore throat and congestion but sore throat has resolved. Associated with cough patient complains of wheezing and post nasal drip. Patient states that she has taken otc Mucinex and Nyquil   States she feels short of breath with chest tightness. No hx of asthma. Remains on immune suppressant medication. Recovering from an appendectomy. Accompanied by her daugher today.   Review of Systems     Objective:   Physical Exam  Constitutional: She appears well-developed and well-nourished.  Ears: T.M's intact without inflammation Sinuses: non-tender Throat: no tonsillar enlargement or exudate Neck: no cervical adenopathy Lungs: bilateral lung fields with inspiratory wheezing     Assessment:    1. Viral upper respiratory tract infection - doxycycline (VIBRA-TABS) 100 MG tablet; Take 1 tablet (100 mg total) by mouth 2 (two) times daily.  Dispense: 20 tablet; Refill: 0  2. Wheezing: demonstration provided - albuterol (PROVENTIL HFA;VENTOLIN HFA) 108 (90 Base) MCG/ACT inhaler; Inhale 2 puffs into the lungs every 6 (six) hours as needed for wheezing or shortness of breath.  Dispense: 1 Inhaler; Refill: 2    Plan:   Discussed continued use of Mucinex D and adding Delsym. Suggested using inhaler at least twice daily. Start abx if sinuses not improving over the course of the week or cough worsening.

## 2017-06-12 ENCOUNTER — Other Ambulatory Visit: Payer: Self-pay

## 2017-06-12 ENCOUNTER — Telehealth: Payer: Self-pay | Admitting: General Practice

## 2017-06-12 ENCOUNTER — Ambulatory Visit
Admission: RE | Admit: 2017-06-12 | Discharge: 2017-06-12 | Disposition: A | Discharge status: Home / Self Care | Payer: MEDICAID

## 2017-06-12 DIAGNOSIS — C7951 Secondary malignant neoplasm of bone: Secondary | ICD-10-CM

## 2017-06-12 DIAGNOSIS — G893 Neoplasm related pain (acute) (chronic): Secondary | ICD-10-CM

## 2017-06-12 DIAGNOSIS — M5442 Lumbago with sciatica, left side: Secondary | ICD-10-CM

## 2017-06-12 DIAGNOSIS — M5441 Lumbago with sciatica, right side: Principal | ICD-10-CM

## 2017-06-12 DIAGNOSIS — G8929 Other chronic pain: Secondary | ICD-10-CM

## 2017-06-12 MED ORDER — GABAPENTIN 100 MG CAPSULE
ORAL_CAPSULE | 3 refills | 0 days | Status: CP
Start: 2017-06-12 — End: 2017-07-27

## 2017-06-12 NOTE — Telephone Encounter (Signed)
Patient called and left a message on Saturday at 8:33pm. She said she has surgery done on 05/16/17 she had a appendectomy laparoscopic done by Dr. Dahlia Byes, she is calling because where the incision was has opened and has liquid coming out she said the hole was the size of an apple seed. She is asking what does she need to do or does she need to come in and have Dr. Dahlia Byes look at it and put some glue on it. Please call patient and advice.

## 2017-06-12 NOTE — Telephone Encounter (Signed)
Returned phone call to patient. She explained that late last week her incision became very red, she developed a light tan sticky drainage x 2 days. Then the drainage subsided on Saturday and patient states that it is trying to heal over but is not fully forming a scab. She states that since drainage has stopped the redness has subsided some but patient is concerned. Added to schedule tomorrow for Dr. Burt Knack.

## 2017-06-13 ENCOUNTER — Encounter: Payer: Self-pay | Admitting: Surgery

## 2017-06-13 ENCOUNTER — Ambulatory Visit (INDEPENDENT_AMBULATORY_CARE_PROVIDER_SITE_OTHER): Payer: Medicaid Other | Admitting: Surgery

## 2017-06-13 VITALS — BP 140/93 | HR 84 | Temp 98.3°F | Wt 203.0 lb

## 2017-06-13 DIAGNOSIS — Z09 Encounter for follow-up examination after completed treatment for conditions other than malignant neoplasm: Secondary | ICD-10-CM

## 2017-06-13 NOTE — Progress Notes (Signed)
Outpatient postop visit  06/13/2017  Kimberly Key is an 41 y.o. female.    Procedure: Laparoscopic appendectomy  CC: Questions about infraumbilical incision  HPI: This patient underwent a laparoscopic appendectomy on June 12 by Dr. Dahlia Byes. Patient noticed some opening of the upper extent of the 12 mm port in the infraumbilical area after a coughing spell. She's had no pain there are no drainage and is eating well having some loose stools but only 2 stools per day without melena or hematochezia no fevers or chills.  Medications reviewed.    Physical Exam:  BP (!) 140/93   Pulse 84   Temp 98.3 F (36.8 C) (Oral)   Wt 203 lb (92.1 kg)   BMI 37.13 kg/m     PE: Morbidly obese female patient multiple scars are noted abdomen is soft and nontender there is minimal erythema in a 2 mm area at the cephalad extent of the 12 mm port site in the infraumbilical area. There is a similar but less erythematous area on the left lateral port site as well but it has an eschar. The suprapubic port site is completely healed. There is no purulence and no drainage from either of these.    Assessment/Plan:  Pathology is reviewed showing no malignancy.  I suspect that this erythema is a relationship to the suture in 2 of the 3 incisions. I see no sign of infection at this point. It is well on its way to healing and I see no need for further therapy. She will return to see [place] if it does not heal or she has any other questions.  Florene Glen, MD, FACS

## 2017-06-25 MED ORDER — OMEPRAZOLE 20 MG CAPSULE,DELAYED RELEASE
ORAL_CAPSULE | Freq: Every day | ORAL | 3 refills | 0.00000 days | Status: CP
Start: 2017-06-25 — End: 2017-10-23

## 2017-06-28 MED ORDER — DESVENLAFAXINE ER 100 MG TABLET,EXTENDED RELEASE 24 HR
ORAL_TABLET | Freq: Every day | ORAL | 0 refills | 0 days | Status: CP
Start: 2017-06-28 — End: 2017-08-01

## 2017-07-06 ENCOUNTER — Ambulatory Visit
Admission: RE | Admit: 2017-07-06 | Discharge: 2017-07-06 | Disposition: A | Discharge status: Home / Self Care | Payer: MEDICAID

## 2017-07-06 DIAGNOSIS — G8929 Other chronic pain: Secondary | ICD-10-CM

## 2017-07-06 DIAGNOSIS — M5442 Lumbago with sciatica, left side: Secondary | ICD-10-CM

## 2017-07-06 DIAGNOSIS — C7951 Secondary malignant neoplasm of bone: Secondary | ICD-10-CM

## 2017-07-06 DIAGNOSIS — G893 Neoplasm related pain (acute) (chronic): Secondary | ICD-10-CM

## 2017-07-06 DIAGNOSIS — M5441 Lumbago with sciatica, right side: Principal | ICD-10-CM

## 2017-07-12 ENCOUNTER — Ambulatory Visit
Admission: RE | Admit: 2017-07-12 | Discharge: 2017-07-12 | Disposition: A | Discharge status: Home / Self Care | Payer: MEDICARE

## 2017-07-12 DIAGNOSIS — G8929 Other chronic pain: Secondary | ICD-10-CM

## 2017-07-12 DIAGNOSIS — M5136 Other intervertebral disc degeneration, lumbar region: Principal | ICD-10-CM

## 2017-07-12 DIAGNOSIS — M5442 Lumbago with sciatica, left side: Secondary | ICD-10-CM

## 2017-07-12 DIAGNOSIS — C7951 Secondary malignant neoplasm of bone: Secondary | ICD-10-CM

## 2017-07-12 DIAGNOSIS — G893 Neoplasm related pain (acute) (chronic): Secondary | ICD-10-CM

## 2017-07-13 ENCOUNTER — Ambulatory Visit: Admission: RE | Admit: 2017-07-13 | Discharge: 2017-07-13 | Payer: MEDICARE

## 2017-07-13 ENCOUNTER — Ambulatory Visit: Admission: RE | Admit: 2017-07-13 | Discharge: 2017-07-13 | Payer: MEDICARE | Attending: Clinical | Admitting: Clinical

## 2017-07-13 DIAGNOSIS — F3341 Major depressive disorder, recurrent, in partial remission: Principal | ICD-10-CM

## 2017-07-13 DIAGNOSIS — F419 Anxiety disorder, unspecified: Secondary | ICD-10-CM

## 2017-07-13 MED ORDER — PRISTIQ 100 MG TABLET,EXTENDED RELEASE
ORAL_TABLET | Freq: Every day | ORAL | 1 refills | 0 days | Status: CP
Start: 2017-07-13 — End: 2017-08-31

## 2017-07-13 MED ORDER — MELATONIN 3 MG TABLET
ORAL_TABLET | Freq: Every evening | ORAL | 0 refills | 0.00000 days
Start: 2017-07-13 — End: 2018-04-24

## 2017-07-13 MED ORDER — ARIPIPRAZOLE 10 MG TABLET
ORAL_TABLET | Freq: Every day | ORAL | 1 refills | 0.00000 days | Status: CP
Start: 2017-07-13 — End: 2017-08-31

## 2017-07-14 ENCOUNTER — Ambulatory Visit
Admission: RE | Admit: 2017-07-14 | Discharge: 2017-07-14 | Disposition: A | Discharge status: Home / Self Care | Payer: MEDICARE

## 2017-07-14 DIAGNOSIS — C50012 Malignant neoplasm of nipple and areola, left female breast: Principal | ICD-10-CM

## 2017-07-17 ENCOUNTER — Ambulatory Visit
Admission: RE | Admit: 2017-07-17 | Discharge: 2017-07-17 | Disposition: A | Discharge status: Home / Self Care | Payer: MEDICARE

## 2017-07-17 DIAGNOSIS — G8929 Other chronic pain: Secondary | ICD-10-CM

## 2017-07-17 DIAGNOSIS — M5442 Lumbago with sciatica, left side: Secondary | ICD-10-CM

## 2017-07-17 DIAGNOSIS — M5137 Other intervertebral disc degeneration, lumbosacral region: Principal | ICD-10-CM

## 2017-07-17 DIAGNOSIS — M5136 Other intervertebral disc degeneration, lumbar region: Principal | ICD-10-CM

## 2017-07-27 ENCOUNTER — Ambulatory Visit
Admission: RE | Admit: 2017-07-27 | Discharge: 2017-07-27 | Disposition: A | Discharge status: Home / Self Care | Payer: MEDICARE | Attending: Hematology & Oncology | Admitting: Hematology & Oncology

## 2017-07-27 ENCOUNTER — Ambulatory Visit
Admission: RE | Admit: 2017-07-27 | Discharge: 2017-07-27 | Disposition: A | Discharge status: Home / Self Care | Payer: MEDICARE

## 2017-07-27 DIAGNOSIS — C50012 Malignant neoplasm of nipple and areola, left female breast: Secondary | ICD-10-CM

## 2017-07-27 DIAGNOSIS — C50919 Malignant neoplasm of unspecified site of unspecified female breast: Principal | ICD-10-CM

## 2017-07-27 DIAGNOSIS — C50912 Malignant neoplasm of unspecified site of left female breast: Secondary | ICD-10-CM

## 2017-07-27 DIAGNOSIS — Z170 Estrogen receptor positive status [ER+]: Secondary | ICD-10-CM

## 2017-07-27 DIAGNOSIS — I890 Lymphedema, not elsewhere classified: Secondary | ICD-10-CM

## 2017-07-27 LAB — CBC W/ AUTO DIFF
BASOPHILS ABSOLUTE COUNT: 0 10*9/L (ref 0.0–0.1)
EOSINOPHILS ABSOLUTE COUNT: 0.4 10*9/L (ref 0.0–0.4)
HEMOGLOBIN: 13.2 g/dL — ABNORMAL LOW (ref 13.5–16.0)
LYMPHOCYTES ABSOLUTE COUNT: 1.6 10*9/L (ref 1.5–5.0)
MEAN CORPUSCULAR HEMOGLOBIN CONC: 34.2 g/dL (ref 31.0–37.0)
MEAN CORPUSCULAR HEMOGLOBIN: 30.8 pg (ref 26.0–34.0)
MEAN CORPUSCULAR VOLUME: 89.9 fL (ref 80.0–100.0)
MEAN PLATELET VOLUME: 6.2 fL — ABNORMAL LOW (ref 7.0–10.0)
MONOCYTES ABSOLUTE COUNT: 0.3 10*9/L (ref 0.2–0.8)
NEUTROPHILS ABSOLUTE COUNT: 4.5 10*9/L (ref 2.0–7.5)
PLATELET COUNT: 214 10*9/L (ref 150–440)
RED BLOOD CELL COUNT: 4.28 10*12/L (ref 4.00–5.20)
RED CELL DISTRIBUTION WIDTH: 14.9 % (ref 12.0–15.0)
WBC ADJUSTED: 6.8 10*9/L (ref 4.5–11.0)

## 2017-07-27 LAB — COMPREHENSIVE METABOLIC PANEL
ALBUMIN: 3.8 g/dL (ref 3.5–5.0)
ALT (SGPT): 42 U/L (ref 15–48)
ANION GAP: 9 mmol/L (ref 9–15)
AST (SGOT): 26 U/L (ref 14–38)
BILIRUBIN TOTAL: 0.1 mg/dL (ref 0.0–1.2)
BLOOD UREA NITROGEN: 10 mg/dL (ref 7–21)
BUN / CREAT RATIO: 15
CALCIUM: 9.2 mg/dL (ref 8.5–10.2)
CHLORIDE: 106 mmol/L (ref 98–107)
CO2: 25 mmol/L (ref 22.0–30.0)
CREATININE: 0.65 mg/dL (ref 0.60–1.00)
EGFR MDRD AF AMER: >=60 mL/min/{1.73_m2} (ref >=60)
EGFR MDRD NON AF AMER: >=60 mL/min/{1.73_m2} (ref >=60)
GLUCOSE RANDOM: 113 mg/dL (ref 65–179)
POTASSIUM: 3.9 mmol/L (ref 3.5–5.0)
PROTEIN TOTAL: 6.2 g/dL — ABNORMAL LOW (ref 6.5–8.3)
SODIUM: 140 mmol/L (ref 135–145)

## 2017-07-27 LAB — BLOOD UREA NITROGEN: Urea nitrogen:MCnc:Pt:Ser/Plas:Qn:: 10

## 2017-07-27 LAB — ALBUMIN: Albumin:MCnc:Pt:Ser/Plas:Qn:: 3.8

## 2017-07-27 LAB — LARGE UNSTAINED CELLS: Lab: 1

## 2017-07-27 LAB — CALCIUM: Calcium:MCnc:Pt:Ser/Plas:Qn:: 9.2

## 2017-07-27 LAB — CREATININE
EGFR MDRD AF AMER: >=60 mL/min/{1.73_m2} (ref >=60)
EGFR MDRD NON AF AMER: >=60 mL/min/{1.73_m2} (ref >=60)

## 2017-07-27 LAB — PHOSPHORUS: Phosphate:MCnc:Pt:Ser/Plas:Qn:: 2.9

## 2017-07-27 LAB — EGFR MDRD AF AMER: Glomerular filtration rate/1.73 sq M.predicted.black:ArVRat:Pt:Ser/Plas/Bld:Qn:Creatinine-based formula (MDRD): >=60

## 2017-07-27 MED ORDER — PALBOCICLIB 125 MG CAPSULE
ORAL_CAPSULE | Freq: Every day | ORAL | 11 refills | 0.00000 days | Status: CP
Start: 2017-07-27 — End: 2017-08-17

## 2017-07-27 NOTE — Unmapped (Signed)
VS taken during clinic visit.    Labs completed via [place].     Pt received Faslodex, Lupron and Xgeva injections. Faslodex injection given in left gluteal. Lupron given in right gluteal. Xgeva given in right upper arm. Pt tolerated well.    AVS received at scheduling.    Pt stable and ambulated independently from clinic.

## 2017-07-27 NOTE — Unmapped (Signed)
Followup Visit Note    Patient Name: Diane Gonzalez  Patient Age: 41 y.o.  Encounter Date: 07/27/2017    Referring Physician:   Towanda Malkin, MD  [address]  [id number]  [place], [place] [address]    Assessment/Plan:    Reason for Visit  Breast Cancer    Cancer Staging  Breast cancer, left breast (CMS-HCC)  Staging form: Breast, AJCC 7th Edition  - Clinical: ER 98%, PR 92%, HER2 0, not amplified; Gr 2 (Nott 6); BRCA 1/2 w.t. - Unsigned  - Pathologic: Stage IIB (T2, N35mi, cM0) - Signed by Talbert Cage, DO on 01/28/2014        1.  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     2.  Status post hysterectomy, ovaries/ tubes intact       3.  History of viral encephalitis with coma, with mild residual neurologic symptoms     4.  Lymphedema     5.  Syncope, probable orthostatic; w/u underway with Dr Julio Alm.        -- MRI of CNS without obvious lesion, final radiology report pending  01/19/16        --  EEG with out evidence of seizures.        -- Cardiology evaluation suggests not cardiac in etiology; referred to neurology       -- OSA 06/15/16; recommended positional therapy 6.  Depression; followed by [name];  on Pristiq and Zyprexa; off of gabapentin     7.  Headaches; resolved off of Effexor     8.  H/O meningitis, with brain biopsy       9.  Osteopenia  DEXA   01/19/16     10. Jehovah's Witness      11. OSA, w/ RLS; recommended positional sleeping, or CPAP    12. S/p Appendectomy, 05/16/17 [facility])           Plan:   -- Patient appears to be doing well, but with radiographic evidence of progressive disease in T1 and T%, without CT changes at those levels.  -- Start palbociclib 125 mg daily; return in 2 weeks for evaluation and cell count check.  -- Plan restaging PET/CT in 3 months, or sooner if new problems arise. Patient may benefit from MRI of the thoracic spine (as there are no changes on the CT portion of the current PET/CT), but after discussing, we have elected  to defer this test for the time being.  -- Return in 1 month for Xgeva (change to monthly from every other month), and FAslodex;  received Lupron today 07/27/17; .           I have reviewed the laboratory, pathology, and radiology reports in detail and discussed findings with the patient.  The patient had a restaging MRI of the abdomen today. Preliminarily, no new lesions noted in the liver.    Interval History:  The patient returns for follow up.  She underwent appendectomy on 05/16/17 and [place] regional. He was no perforation, no evidence of malignancy. Since that surgery, her long-standing chronic nausea has resolved. She underwent injection for the sacral bone lesion on 07/17/17; she has not required any additional pain medication since then. She underwent PET/CT on 07/14/17, and unfortunately this shows 2 new sites of disease at T1 and T5 vertebral bodies. There are no changes on the CT portion of the study at those sites. The area in the sacrum is not well visualized, and did not have significant FDG avidity. The area in the liver is said to have greater FDG avidity (with no new areas of disease), but the changes are subtle and off but they're not evident on my review of the PET scan. She denies headaches, visual changes, nausea, vomiting, dyspnea, cough, new bone pain, syncope, seizures, abdominal fullness, bowel or bladder dysfunction. Her leg weakness seems to have improved since she had her sacral injection.  Hot flashes are tolerable.               Breast cancer, left breast (CMS-HCC)    11/22/2013 Initial Diagnosis     Breast cancer, left breast; 4.8 cm left breast mass; suspicious nodes.         11/25/2013 Biopsy     IDC, Gr 2; ER +, PR +, HER2 0/not amplified         12/11/2013 -  Cancer Staged     BRCA 1/2 without mutation         12/31/2013 Surgery     left simple mastectomy and SLND: 4.5 cm primary; DCIS 1 of 5 positive node with 1.3 mm deposit.         01/10/2014 Surgery     ALND: 0 of 8 nodes imvolved.         02/13/2014 - 04/17/2014 Chemotherapy     Taxotere/Cyclophosphamide x 4 cycles         05/07/2014 - 06/27/2014 Radiation     33 fractions.         07/18/2014 - 01/24/2015 Chemotherapy     Tamoxifen. Stopped secondary to side effects.         02/01/2015 -  Chemotherapy     Leuprolide and letrozole         03/02/2015 -  Chemotherapy     Anastrozole in place of letrozole, secondary to nausea; not tolerated. Prescription for exemestane written, but not filled.         11/09/2015 -  Chemotherapy     Exemestane started by the patient         11/09/2015 -  Cancer Staged     Chest CT:  Irregular, mottled, and mildly expansile appearance of left seventh posterior rib may represent osseous metastasis. No lytic or blastic lesions are noted in thoracic spine. Mild degenerative changes of thoracic spine.          11/24/2015 -  Cancer Staged     PET/CT: Nodule  in the left liver lobe, corresponds to ring-enhancing lesion present on 11/09/15 chest CT; left seventh rib equivocal for metastatic disease versus fracture. No other significant areas of uptake.         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 -- Two tiny T2 hyperintense lesions in the segment VII and segment VIA ;probable cysts.         02/05/2016 Surgery     Invasive adenocarcinoma, consistent with a metastasis from a breast (ductal) primary (2.5 cm) - Parenchymal resection margin is widely free (2.5 cm to carcinoma) ?????? - Estrogen receptor: Positive (95%, 3+; PR 3+; HER2 0         07/06/2016 -  Cancer Staged     CT [id number] sclerotic lesion concerning for metastases in a patient with history of breast cancer. Correlation with nuclear medicine bone scintigraphy is recommended.  -Nodular enhancement in the midline incision as well as in the underlying mesentery is indeterminant and may represent postsurgical change, less likely metastatic disease. Attention on follow-up.         07/15/2016 -  Cancer Staged     MRI L-spine without contrast: Mild degenerative changes in lumbar spine without significant spinal canal or neural foraminal narrowing. No evidence of malignancy.         07/19/2016 -  Cancer Staged     NM Bone Scan:  Persistent uptake in the left posterior seventh rib, likely healing fracture.  - Sclerotic sacral lesion without increased radiotracer uptake.         01/26/2017 -  Cancer Staged     PET-CT:  - Increased uptake at the hepatic resection site, most likely postsurgical change. Recommend clinical correlation. A PET/CT in 3-6 months may be helpful to more fully characterize  - Probable healing fracture in the left seventh rib and treated metastasis in the sacrum. Recommend attention on future scans.          07/06/2017 -  Cancer Staged       MRI L-Spine: Sclerotic lesion at the anterior aspect of S2 with thin rim of peripheral enhancement, suggestive of sclerotic metastasis, unchanged in size from prior study. Additional ill-defined focus of enhancement in the right sacrum adjacent to the S2 neural foramen may also represent a small site of metastasis.  - L5-S1 central disc protrusion with mild central canal narrowing and neural foraminal narrowing.  - Findings suggestive of mild osteitis pubis.  - Left gluteus medius minimus tendinosis/partial tear. Mild right gluteus minimus tendinosis.         07/14/2017 -  Cancer Staged       PET-CT:    Overall progression of disease with new avid FDG uptake in the T1 vertebral body and T5 right pedicle concerning for metastatic disease. Along with interval increase in the FDG uptake in the more focal hepatic lesion when compared to prior imaging.    - Stable lesions in the left seventh rib and sacrum.                    The following portions of the patient's history were reviewed and updated as appropriate: current medications  and problem list.    Review of Systems   All other systems reviewed and are negative.      Vital signs for this encounter:  BSA: 1.99 meters squared  BP 129/70  - Pulse 77  - Temp 36.4 ??C (97.5 ??F) (Oral)  - Resp 20  - Wt 90.3 kg (199 lb)  - SpO2 98%  - BMI 36.39 kg/m??     Physical Exam  Constitutional: She is oriented to person, place, and time. She appears well-developed and well-nourished. No distress.   HENT:   Head: Normocephalic.   Mouth/Throat: Oropharynx is clear and moist.   Eyes: EOM are normal. Pupils are equal, round, and reactive to light.   Neck: No JVD present. No thyromegaly present.   Cardiovascular: Normal rate and normal heart sounds. ??  Pulmonary/Chest: Effort normal and breath sounds normal.   Breast: ??Left chest wall is without nodules or tenderness. The right breast is without suspicious nodules or discharge.   Lymph: No adenopathy in the neck, supraclavicular, axillary or inguinal regions.??  Abdominal: Soft. She exhibits no distension and no mass. There is no tenderness.   Musculoskeletal: She exhibits no edema. No tenderness to percussion over spine, chest wall,  pelvis or hips. No tenderness to palpation over the sacrum, or SI joint regions bilaterally.??   Neurological: She is alert and oriented to person, place, and time. No cranial nerve deficit. She exhibits normal muscle tone. Coordination normal.   Skin: No rash noted.   Psychiatric: She has a normal mood and affect. Her behavior is normal.         Karnofsky/Lansky Performance Status  80, Normal activity with effort; some signs or symptoms of disease (ECOG equivalent 1)     Results:    WBC   Date Value Ref Range Status   07/27/2017 6.8 4.5 - 11.0 10*9/L Final   10/24/2014 5.8 4.5 - 11.0 10*9/L Final     HGB   Date Value Ref Range Status   07/27/2017 13.2 (L) 13.5 - 16.0 g/dL Final   24/40/1027 25.3 12.0 - 16.0 g/dL Final     HCT   Date Value Ref Range Status   07/27/2017 38.4 36.0 - 46.0 % Final   10/24/2014 37.7 36.0 - 46.0 % Final     Platelet   Date Value Ref Range Status   07/27/2017 214 150 - 440 10*9/L Final   10/24/2014 199 150 - 440 10*9/L Final     LDH   Date Value Ref Range Status   04/23/2015 328 (L) 338 - 610 U/L Final     Creatinine Whole Blood, POC   Date Value Ref Range Status   07/06/2016 0.4 (L) 0.7 - 1.1 mg/dL Final     Creatinine   Date Value Ref Range Status   05/10/2017 0.68 0.60 - 1.00 mg/dL Final     AST   Date Value Ref Range Status   01/05/2017 24 14 - 38 U/L Final   01/22/2015 29 14 - 38 U/L Final     CEA   Date Value Ref Range Status   12/29/2015 1.5 0.0 - 5.0 ng/mL Final   02/06/2014 <0.5 0.0 - 5.0 ng/mL Final     AFP-Tumor Marker   Date Value Ref Range Status   12/29/2015 2.01 <7.51 ng/mL Final

## 2017-07-27 NOTE — Unmapped (Signed)
Labs drawn via [place].    Port flushed and brisk blood return     Pt tolerated well.

## 2017-07-27 NOTE — Unmapped (Addendum)
-- When the Ibrance (palbociclib) tablets arrive, start taking one tablet daily.  We will want to have you return in 2 weeks, after starting the medication, to make certain her blood counts are acceptable.  If you are having any problem with the medication, hold the medication and contact me to discuss your options.       palbociclib  Pronunciation:  PAL boe SYE klib  Brand:  Ibrance  What is the most important information I should know about palbociclib?  Follow all directions on your medicine label and package. Tell each of your healthcare providers about all your medical conditions, allergies, and all medicines you use.  What is palbociclib?  Palbociclib is a cancer medicine that interferes with the growth and spread of cancer cells in the body.  Palbociclib is used in men and women to treat certain types of advanced breast cancer that has spread to other parts of the body.  In postmenopausal women, palbociclib is given in combination with a hormonal medicine called letrozole (Femara). In others, palbociclib is given together with fulvestrant (Faslodex).  Palbociclib may also be used for purposes not listed in this medication guide.  What should I discuss with my healthcare provider before taking palbociclib?  You should not use palbociclib if you are allergic to it.  To make sure palbociclib is safe for you, tell your doctor if you have:  ?? liver disease; or  ?? kidney disease.  Do not use palbociclib if you are pregnant. It could harm the unborn baby. Use effective birth control to prevent pregnancy while you are using this medicine and for at least 3 weeks after your last dose.  You may need to have a negative pregnancy test before starting this treatment.  It is not known whether palbociclib passes into breast milk or if it could harm a nursing baby. You should not breast-feed while using this medicine.  How should I take palbociclib?  Follow all directions on your prescription label. Your doctor may occasionally change your dose to make sure you get the best results. Do not use this medicine in larger or smaller amounts or for longer than recommended.  Palbociclib is given in a 28-day treatment cycle, and you may only need to take the medicine during the first 3 weeks of each cycle. Your doctor will determine how long to treat you with palbociclib.  Take with food.  Take the medicine at the same time each day.  Do not crush, chew, break, or open a palbociclib capsule. Swallow it whole.  If you vomit after taking the medicine, wait until the next day to take your next dose.  Do not use a broken or damaged pill.  Palbociclib can lower blood cells that help your body fight infections and help your blood to clot. This can make it easier for you to bleed from an injury or get sick from being around others who are ill. Your blood may need to be tested often.  Store at room temperature away from moisture and heat.  What happens if I miss a dose?  Skip the missed dose and do not take the medicine again until your next scheduled dose. Do not take extra medicine to make up the missed dose.  What happens if I overdose?  Seek emergency medical attention or call the Poison Help line at [phone].  What should I avoid while taking palbociclib?  Grapefruit and grapefruit juice may interact with palbociclib and lead to unwanted side effects. Avoid the  use of grapefruit products while taking palbociclib.  Avoid being near people who are sick or have infections. Tell your doctor at once if you develop signs of infection.  What are the possible side effects of palbociclib?  Get emergency medical help if you have signs of an allergic reaction: hives; difficult breathing; swelling of your face, lips, tongue, or throat.  Call your doctor at once if you have:  ?? easy bruising, unusual bleeding (nose, mouth, vagina, or rectum), purple or red pinpoint spots under your skin;  ?? blisters or ulcers in your mouth, red or swollen gums, trouble swallowing;  ?? low white blood cell counts --fever, swollen gums, painful mouth sores, pain when swallowing, skin sores, cold or flu symptoms, cough, trouble breathing;  ?? low red blood cells (anemia) --pale skin, feeling light-headed or short of breath, rapid heart rate, trouble concentrating; or  ?? signs of a blood clot in the lung --chest pain, sudden cough, wheezing, rapid breathing, coughing up blood.  Your cancer treatments may be delayed or permanently discontinued if you have certain side effects.  Common side effects may include:  ?? nausea, vomiting, diarrhea, loss of appetite;  ?? mouth sores, decreased sense of taste;  ?? weakness, feeling tired;  ?? numbness, tingling, or burning pain in your arms, legs, hands or feet;  ?? abnormal liver function tests;  ?? cold symptoms such as stuffy nose, sneezing, sore throat;  ?? nosebleed; or  ?? dry skin, temporary hair loss.  This is not a complete list of side effects and others may occur. Call your doctor for medical advice about side effects. You may report side effects to FDA at 1-800-FDA-1088.  What other drugs will affect palbociclib?  Other drugs may interact with palbociclib, including prescription and over-the-counter medicines, vitamins, and herbal products. Tell each of your health care providers about all medicines you use now and any medicine you start or stop using.  Where can I get more information?  Your pharmacist can provide more information about palbociclib.    Remember, keep this and all other medicines out of the reach of children, never share your medicines with others, and use this medication only for the indication prescribed.  Every effort has been made to ensure that the information provided by [employer] ('[employer]') is accurate, up-to-date, and complete, but no guarantee is made to that effect. Drug information contained herein may be time sensitive. [employer] information has been compiled for use by healthcare practitioners and consumers in the [place] and therefore [employer] does not warrant that uses outside of the [place] are appropriate, unless specifically indicated otherwise. [employer]'s drug information does not endorse drugs, diagnose patients or recommend therapy. [employer]'s drug information is an [employer], corporate to assist licensed healthcare practitioners in caring for their patients and/or to serve consumers viewing this service as a supplement to, and not a substitute for, the expertise, skill, knowledge and judgment of healthcare practitioners. The absence of a warning for a given drug or drug combination in no way should be construed to indicate that the drug or drug combination is safe, effective or appropriate for any given patient. Mult[employer] does not assume any responsibility for any aspect of healthcare administered with the aid of information [employer] provides. The information contained herein is not intended to cover all possible uses, directions, precautions, warnings, drug interactions, allergic reactions, or adverse effects. If you have questions about the drugs you are taking, check with your doctor, nurse or pharmacist.  Copyright 9383762540 [employer] Version: 2.01. Revision date: 03/17/2016.  Care instructions adapted under license by [facility]. If you have questions about a medical condition or this instruction, always ask your healthcare professional. [employer] disclaims any warranty or liability for your use of this information.

## 2017-08-01 MED FILL — IBRANCE/125MG/CAPS: IBRANCE/125MG/CAPS | 28 days supply | Qty: 21 | Fill #0

## 2017-08-01 NOTE — Unmapped (Signed)
[facility]   Patient Onboarding/Medication Counseling    Diane Gonzalez is a 41 y.o. female with breast cancer who I am counseling today on initiation of therapy.    Medication: Ilda Foil    Verified patient's date of birth / HIPAA.      Education Provided: ??    Dose/Administration discussed: take 1 tablet daily with food for 21 days, then take 7 days off. This medication should be taken  with food.     Storage requirements: this medicine should be stored at room temperature.     Side effects discussed: Discussed common side effects, including fatigue, nausea, mouth sores. If patient experiences signs of infection, rash, unusual bruising/bleeding, they need to call the doctor.  Patient will receive a Lexi-Comp drug information handout with shipment.    Handling precautions reviewed:  Patient was counseled on the hazards surrounding oral chemotherapy and will minimize contact/exposure to the medicine.    Drug Interactions: other medications reviewed and up to date in Epic.  No drug interactions identified, but counseled patients to avoid grapefruit and grapefruit juice.    Comorbidities/Allergies: reviewed and up to date in Epic.    Verified therapy is appropriate and should continue      Delivery Information    Anticipated copay of $3 reviewed with patient. Verified delivery address in [facility] and reviewed medication storage requirement.    Scheduled delivery date: 8/29    Explained that we ship using UPS and this shipment will not require a signature.      Explained the services we provide at [facility] and that each month we would call to set up refills.  Stressed importance of returning phone calls so that we could ensure they receive their medications in time each month.  Informed patient that we should be setting up refills 7-10 days prior to when they will run out of medication.  Informed patient that welcome packet will be sent.      Patient verbalized understanding of the above information as well as how to contact the pharmacy at [phone] option 4 with any questions/concerns.        Patient Specific Needs      ? Patient has no physical or cognitive barriers.    ? Patient prefers to have medications discussed with  Patient     ? Patient is able to read and understand education materials at a high school level or above.        Clydell Hakim  [facility] Specialty Pharmacist

## 2017-08-16 ENCOUNTER — Ambulatory Visit
Admission: RE | Admit: 2017-08-16 | Discharge: 2017-08-16 | Disposition: A | Discharge status: Home / Self Care | Payer: MEDICARE | Attending: Hematology & Oncology | Admitting: Hematology & Oncology

## 2017-08-16 DIAGNOSIS — C50919 Malignant neoplasm of unspecified site of unspecified female breast: Principal | ICD-10-CM

## 2017-08-16 DIAGNOSIS — C50012 Malignant neoplasm of nipple and areola, left female breast: Secondary | ICD-10-CM

## 2017-08-16 LAB — CBC W/ AUTO DIFF
BASOPHILS ABSOLUTE COUNT: 0 10*9/L (ref 0.0–0.1)
EOSINOPHILS ABSOLUTE COUNT: 0.2 10*9/L (ref 0.0–0.4)
HEMOGLOBIN: 12.6 g/dL — ABNORMAL LOW (ref 13.5–16.0)
LARGE UNSTAINED CELLS: 2 % (ref 0–4)
LYMPHOCYTES ABSOLUTE COUNT: 1.7 10*9/L (ref 1.5–5.0)
MEAN CORPUSCULAR HEMOGLOBIN CONC: 35.2 g/dL (ref 31.0–37.0)
MEAN CORPUSCULAR HEMOGLOBIN: 32.1 pg (ref 26.0–34.0)
MEAN CORPUSCULAR VOLUME: 91 fL (ref 80.0–100.0)
MEAN PLATELET VOLUME: 8.3 fL (ref 7.0–10.0)
NEUTROPHILS ABSOLUTE COUNT: 1.2 10*9/L — ABNORMAL LOW (ref 2.0–7.5)
RED BLOOD CELL COUNT: 3.93 10*12/L — ABNORMAL LOW (ref 4.00–5.20)
RED CELL DISTRIBUTION WIDTH: 16.4 % — ABNORMAL HIGH (ref 12.0–15.0)
WBC ADJUSTED: 3.3 10*9/L — ABNORMAL LOW (ref 4.5–11.0)

## 2017-08-16 LAB — LYMPHOCYTES ABSOLUTE COUNT: Lab: 1.7

## 2017-08-16 NOTE — Unmapped (Signed)
Followup Visit Note    Patient Name: Diane Gonzalez  Patient Age: 41 y.o.  Encounter Date: 08/16/2017    Referring Physician:   Towanda Malkin, MD  [address]  [address] [facility] [id number]  [place], [place] [address]    Assessment/Plan:    Reason for Visit  Breast Cancer    Cancer Staging  Breast cancer, left breast (CMS-HCC)  Staging form: Breast, AJCC 7th Edition  - Clinical: ER 98%, PR 92%, HER2 0, not amplified; Gr 2 (Nott 6); BRCA 1/2 w.t. - Unsigned  - Pathologic: Stage IIB (T2, N72mi, cM0) - Signed by Talbert Cage, DO on 01/28/2014        1.  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        -- 07/14/17: PET CT: progression in T1/T5;          -- 08/02/17: Ibrance 125 mg daily     2.  Status post hysterectomy, ovaries/ tubes intact       3.  History of viral encephalitis with coma, with mild residual neurologic symptoms     4.  Lymphedema     5.  Syncope, probable orthostatic; w/u underway with Dr Julio Alm.        -- MRI of CNS without obvious lesion, final radiology report pending  01/19/16        --  EEG with out evidence of seizures.        -- Cardiology evaluation suggests not cardiac in etiology; referred to neurology        -- OSA 06/15/16; recommended positional therapy     6.  Depression; followed by [name];  on Pristiq and Zyprexa; off of gabapentin     7.  Headaches; resolved off of Effexor     8.  H/O meningitis, with brain biopsy       9.  Osteopenia  DEXA   01/19/16     10. Je[employer]      11. OSA, w/ RLS; recommended positional sleeping, or CPAP    12. S/p Appendectomy, 05/16/17 [facility]    13. Lumbosacral mediated pain, lumbar DDD, possible sciatica          -- 07/17/17: Caudal epidural steroid injection with good pain relief.           Plan:   -- Patient is tolerating Ibrance at 125 mg daily, with only mild increased fatigue.  Awaiting cell count check.  Plan radiographic restaging of progressive disease in T1 and T5, without CT changes at those levels, after 3-6 months of therapy if clinically stable.   Consider MRI of spine for staging.   --  Return in 2 weeks for cell count check.  -- Return in 1 month for Xgeva (change to monthly from every other month), and FAslodex;  and Lupron; next planned dose 08/24/17; .           I have reviewed the laboratory, pathology, and radiology reports in detail and discussed findings with the patient.  The patient had a restaging MRI of the abdomen today. Preliminarily, no new lesions noted in the liver.    Interval History:  The patient returns for follow up.  She had sacral epidural steroid injection on 07/16/17, has been essentially pain-free since. She started on palbociclib 125 mg 2 weeks ago, and has been tolerating therapy without difficulty. She does feel that she is somewhat more fatigued for several hours after taking the pill along with her evening meal. She denies nausea, rash, fevers, chills, other malaise, headaches, visual changes, seizures, dyspnea, cough, anorexia, bowel or bladder dysfunction, swollen glands and new pain.           Breast cancer, left breast (CMS-HCC)    11/22/2013 Initial Diagnosis Breast cancer, left breast; 4.8 cm left breast mass; suspicious nodes.         11/25/2013 Biopsy     IDC, Gr 2; ER +, PR +, HER2 0/not amplified         12/11/2013 -  Cancer Staged     BRCA 1/2 without mutation         12/31/2013 Surgery     left simple mastectomy and SLND: 4.5 cm primary; DCIS 1 of 5 positive node with 1.3 mm deposit.         01/10/2014 Surgery     ALND: 0 of 8 nodes imvolved.         02/13/2014 - 04/17/2014 Chemotherapy     Taxotere/Cyclophosphamide x 4 cycles         05/07/2014 - 06/27/2014 Radiation     33 fractions.         07/18/2014 - 01/24/2015 Chemotherapy     Tamoxifen. Stopped secondary to side effects.         02/01/2015 -  Chemotherapy     Leuprolide and letrozole         03/02/2015 -  Chemotherapy     Anastrozole in place of letrozole, secondary to nausea; not tolerated. Prescription for exemestane written, but not filled.         11/09/2015 -  Chemotherapy     Exemestane started by the patient         11/09/2015 -  Cancer Staged     Chest CT:  Irregular, mottled, and mildly expansile appearance of left seventh posterior rib may represent osseous metastasis. No lytic or blastic lesions are noted in thoracic spine. Mild degenerative changes of thoracic spine.          11/24/2015 -  Cancer Staged     PET/CT: Nodule in the left liver lobe, corresponds to ring-enhancing lesion present on 11/09/15 chest CT; left seventh rib equivocal for metastatic disease versus fracture. No other significant areas of uptake.         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 -- Two tiny T2 hyperintense lesions in the segment VII and segment VIA ;probable cysts.         02/05/2016 Surgery     Invasive adenocarcinoma, consistent with a metastasis from a breast (ductal) primary (2.5 cm) - Parenchymal resection margin is widely free (2.5 cm to carcinoma) ?????? - Estrogen receptor: Positive (95%, 3+; PR 3+; HER2 0         07/06/2016 -  Cancer Staged     CT ZOX:[id number] sclerotic lesion concerning for metastases in a patient with history of breast cancer. Correlation with nuclear medicine bone scintigraphy is recommended.  -Nodular enhancement in the midline incision as well as in the underlying mesentery is indeterminant and may represent postsurgical change, less likely metastatic disease. Attention on follow-up.         07/15/2016 -  Cancer Staged     MRI L-spine without contrast: Mild degenerative changes in lumbar spine without significant spinal canal or neural foraminal narrowing. No evidence of malignancy.         07/19/2016 -  Cancer Staged     NM Bone Scan:  Persistent uptake in the left posterior seventh rib, likely healing fracture.  - Sclerotic sacral lesion without increased radiotracer uptake.         01/26/2017 -  Cancer Staged     PET-CT:  - Increased uptake at the hepatic resection site, most likely postsurgical change. Recommend clinical correlation. A PET/CT in 3-6 months may be helpful to more fully characterize  - Probable healing fracture in the left seventh rib and treated metastasis in the sacrum. Recommend attention on future scans.          07/06/2017 -  Cancer Staged       MRI L-Spine: Sclerotic lesion at the anterior aspect of S2 with thin rim of peripheral enhancement, suggestive of sclerotic metastasis, unchanged in size from prior study. Additional ill-defined focus of enhancement in the right sacrum adjacent to the S2 neural foramen may also represent a small site of metastasis.  - L5-S1 central disc protrusion with mild central canal narrowing and neural foraminal narrowing.  - Findings suggestive of mild osteitis pubis.  - Left gluteus medius minimus tendinosis/partial tear. Mild right gluteus minimus tendinosis. 07/14/2017 -  Cancer Staged       PET-CT:    Overall progression of disease with new avid FDG uptake in the T1 vertebral body and T5 right pedicle concerning for metastatic disease. Along with interval increase in the FDG uptake in the more focal hepatic lesion when compared to prior imaging.    - Stable lesions in the left seventh rib and sacrum.                    The following portions of the patient's history were reviewed and updated as appropriate: current medications and problem list.    Review of Systems   All other systems reviewed and are negative.      Vital signs for this encounter:  BSA: 2.02 meters squared  BP 135/79  - Pulse 86  - Temp 36 ??C (96.8 ??F) (Oral)  - Resp 20  - Wt 93.4 kg (206 lb)  - BMI 37.67 kg/m??     Physical  Exam  Constitutional: She is oriented to person, place, and time. She appears well-developed and well-nourished. No distress.   HENT:   Head: Normocephalic.   Mouth/Throat: Oropharynx is clear and moist.   Eyes: EOM are normal. Pupils are equal, round, and reactive to light.   Neck: No JVD present. No thyromegaly present.   Cardiovascular: Normal rate and normal heart sounds. ??  Pulmonary/Chest: Effort normal and breath sounds normal.   Breast: 07/27/17:??Left chest wall is without nodules or tenderness. The right breast is without suspicious nodules or discharge.   Lymph: No adenopathy in the neck, supraclavicular, axillary or inguinal regions.??  Abdominal: Soft. She exhibits no distension and no mass. There is no tenderness.   Musculoskeletal: She exhibits no edema. No tenderness to percussion over spine, chest wall,  pelvis or hips. No tenderness to palpation over the sacrum, or SI joint regions bilaterally.??   Neurological: She is alert and oriented to person, place, and time. No cranial nerve deficit. She exhibits normal muscle tone. Coordination normal.   Skin: No rash noted.   Psychiatric: She has a normal mood and affect. Her behavior is normal.         Karnofsky/Lansky Performance Status  80, Normal activity with effort; some signs or symptoms of disease (ECOG equivalent 1)     Results:    WBC   Date Value Ref Range Status   07/27/2017 6.8 4.5 - 11.0 10*9/L Final   10/24/2014 5.8 4.5 - 11.0 10*9/L Final     HGB   Date Value Ref Range Status   07/27/2017 13.2 (L) 13.5 - 16.0 g/dL Final   16/09/9603 54.0 12.0 - 16.0 g/dL Final     HCT   Date Value Ref Range Status   07/27/2017 38.4 36.0 - 46.0 % Final   10/24/2014 37.7 36.0 - 46.0 % Final     Platelet   Date Value Ref Range Status   07/27/2017 214 150 - 440 10*9/L Final   10/24/2014 199 150 - 440 10*9/L Final     LDH   Date Value Ref Range Status   04/23/2015 328 (L) 338 - 610 U/L Final     Creatinine Whole Blood, POC   Date Value Ref Range Status   07/06/2016 0.4 (L) 0.7 - 1.1 mg/dL Final     Creatinine   Date Value Ref Range Status   07/27/2017 0.66 0.60 - 1.00 mg/dL Final   98/10/9146 8.29 0.60 - 1.00 mg/dL Final     AST   Date Value Ref Range Status   07/27/2017 26 14 - 38 U/L Final   01/22/2015 29 14 - 38 U/L Final     CEA   Date Value Ref Range Status   12/29/2015 1.5 0.0 - 5.0 ng/mL Final   02/06/2014 <0.5 0.0 - 5.0 ng/mL Final     AFP-Tumor Marker   Date Value Ref Range Status   12/29/2015 2.01 <7.51 ng/mL Final

## 2017-08-16 NOTE — Unmapped (Signed)
Flu shot given as ordered without complication or distress.    Port accessed and de-accessed as per protocol related to required lab work.    Patient left the clinic ambulatory with steady gate and is to follow-up directed.

## 2017-08-21 MED ORDER — PALBOCICLIB 125 MG CAPSULE
Freq: Every day | ORAL | 11 refills | 0.00000 days | Status: CP
Start: 2017-08-21 — End: 2018-08-21

## 2017-08-21 MED ORDER — PALBOCICLIB 125 MG CAPSULE: each | 11 refills | 0 days

## 2017-08-21 NOTE — Unmapped (Signed)
[facility] Refill and Clinical Coordination Note  Medication(s): Diane Gonzalez, DOB: Jul 24, 1976  Phone: [phone] (home) [phone] (work), Alternate phone contact: N/A  Shipping address: [address]  [place] [address]  Phone or address changes today?: No  All above HIPAA information verified.  Insurance changes? No    Completed refill and clinical call assessment today to schedule patient's medication shipment from the [facility] [phone]).      MEDICATION RECONCILIATION    Confirmed the medication and dosage are correct and have not changed: Yes, regimen is correct and unchanged.    Were there any changes to your medication(s) in the past month:  No, there are no changes reported at this time.    ADHERENCE    Is this medicine transplant or covered by Medicare Part B? No.      Did you miss any doses in the past 4 weeks? No missed doses reported.  Adherence counseling provided? Not needed     SIDE EFFECT MANAGEMENT    Are you tolerating your medication?:  Diane Gonzalez reports side effects of hair loss and mouth tenderness.  Side effect management discussed: patient will call doctor if mouth tendernes gets worse or if she gets sores in her mouth.      Therapy is appropriate and should be continued.    Evidence of clinical benefit: See Epic note from 08/16/17      FINANCIAL/SHIPPING    Delivery Scheduled: Yes, Expected medication delivery date: 9/21   Additional medications refilled: No additional medications/refills needed at this time.    Diane Gonzalez did not have any additional questions at this time.    Delivery address validated in [facility] scheduling system: Yes, address listed above is correct.      We will follow up with patient monthly for standard refill processing and delivery.      Thank you,  Clydell Hakim   [facility] Specialty Pharmacist

## 2017-08-23 MED FILL — IBRANCE/125MG/CAPS: IBRANCE/125MG/CAPS | 28 days supply | Qty: 21 | Fill #1

## 2017-08-24 ENCOUNTER — Ambulatory Visit
Admission: RE | Admit: 2017-08-24 | Discharge: 2017-08-24 | Disposition: A | Discharge status: Home / Self Care | Payer: MEDICARE

## 2017-08-24 DIAGNOSIS — I890 Lymphedema, not elsewhere classified: Secondary | ICD-10-CM

## 2017-08-24 DIAGNOSIS — C50912 Malignant neoplasm of unspecified site of left female breast: Secondary | ICD-10-CM

## 2017-08-24 DIAGNOSIS — C50012 Malignant neoplasm of nipple and areola, left female breast: Principal | ICD-10-CM

## 2017-08-24 DIAGNOSIS — Z170 Estrogen receptor positive status [ER+]: Secondary | ICD-10-CM

## 2017-08-24 LAB — CREATININE
CREATININE: 0.64 mg/dL (ref 0.60–1.00)
Creatinine:MCnc:Pt:Ser/Plas:Qn:: 0.64

## 2017-08-24 LAB — PHOSPHORUS
PHOSPHORUS: 4.5 mg/dL (ref 2.9–4.7)
Phosphate:MCnc:Pt:Ser/Plas:Qn:: 4.5

## 2017-08-24 LAB — CALCIUM: Calcium:MCnc:Pt:Ser/Plas:Qn:: 9

## 2017-08-24 LAB — ALBUMIN: Albumin:MCnc:Pt:Ser/Plas:Qn:: 3.9

## 2017-08-24 NOTE — Unmapped (Signed)
Labs completed via [place].     Pt received Faslodex, Lupron and Xgeva injection in right gluteal, left gluteal and arm upper arm  - pt tolerated well.    AVS received at scheduling.    Pt stable and ambulated independently from clinic.

## 2017-08-28 IMAGING — CT CT ABD-PELV W/ CM
2 of 5 series · 16 of 46 positions shown, 18 images · IV contrast (APPLIED)
Comparison: No priors.

CLINICAL DATA: 41-year-old female with history of nausea and
vomiting since 7 a.m. today. Periumbilical abdominal pain.

EXAM:
CT ABDOMEN AND PELVIS WITH CONTRAST
TECHNIQUE: Multidetector CT imaging of the abdomen and pelvis was performed
using the standard protocol following bolus administration of
intravenous contrast.
CONTRAST:  100mL 8CXMUV-ZOO IOPAMIDOL (8CXMUV-ZOO) INJECTION 61%

[Series 2: routine abd/pel with · axial · 0.98mm/px · z∈[-476,-70]mm · 13 of 93 slices shown, 15 images]
[im 6/93  soft-tissue]
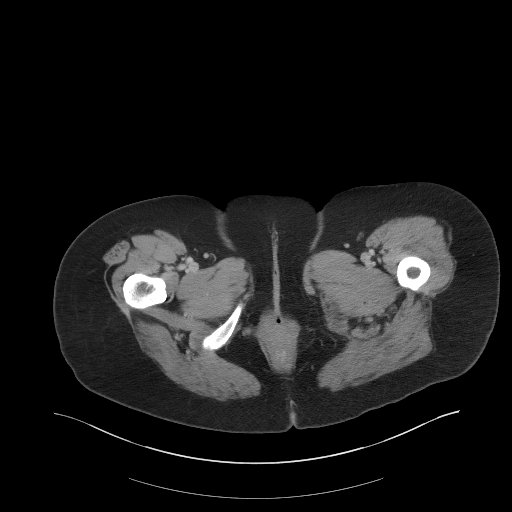
[im 6/93  bone]
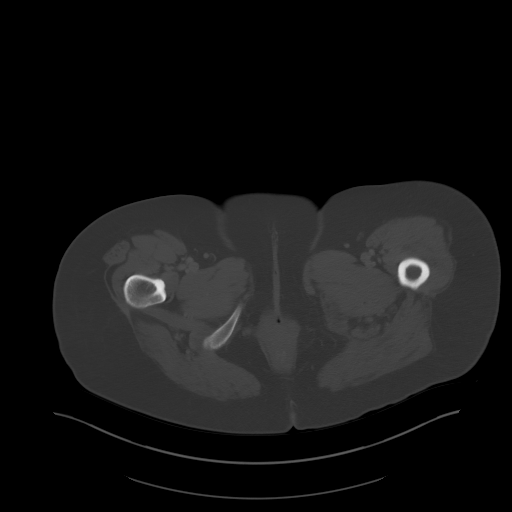
[im 11/93  soft-tissue]
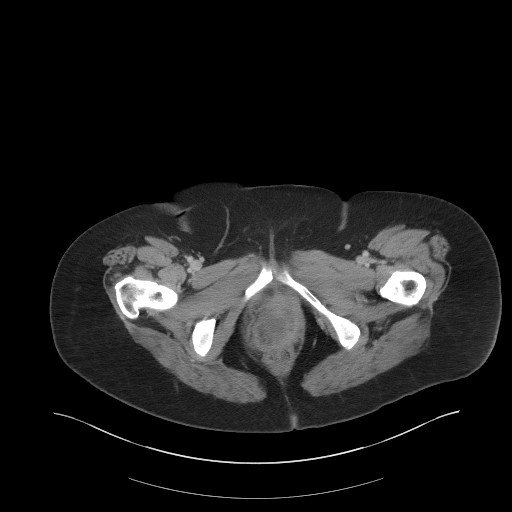
[im 21/93  soft-tissue]
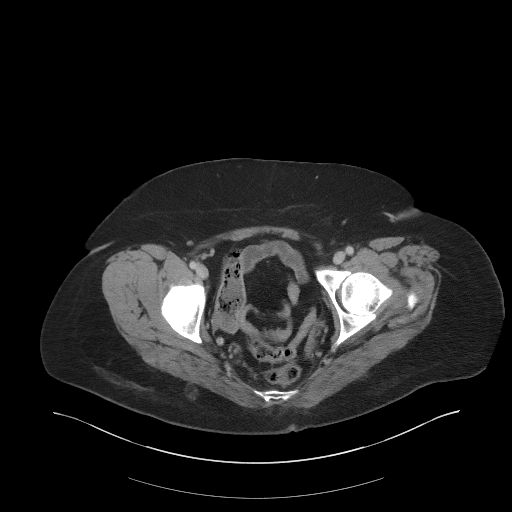
[im 26/93  soft-tissue]
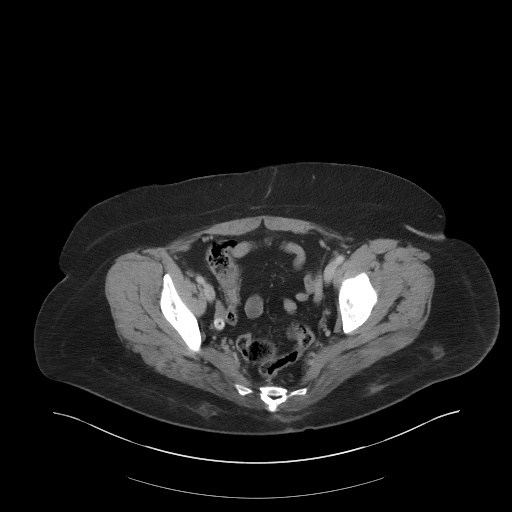
[im 31/93  soft-tissue]
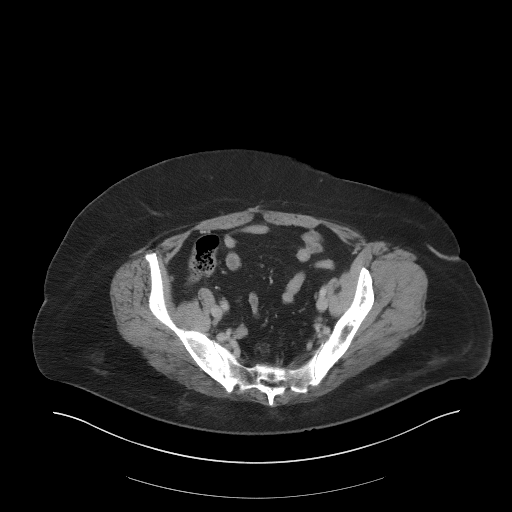
[im 41/93  soft-tissue]
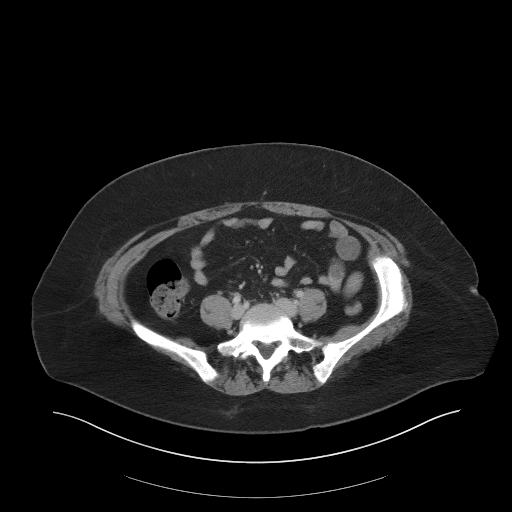
[im 47/93  soft-tissue]
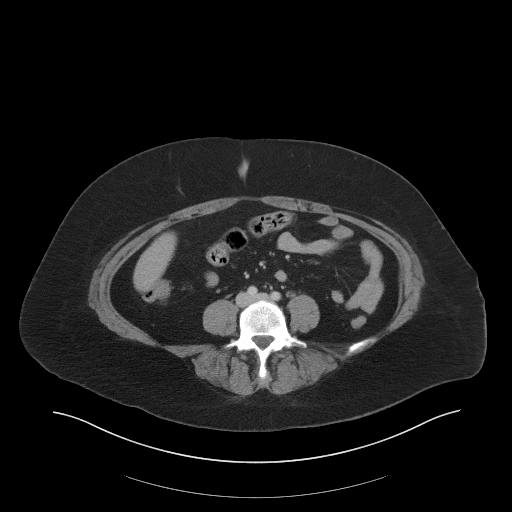
[im 52/93  soft-tissue]
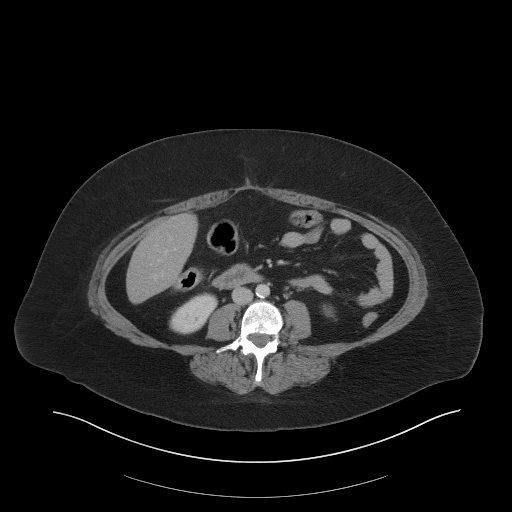
[im 62/93  soft-tissue]
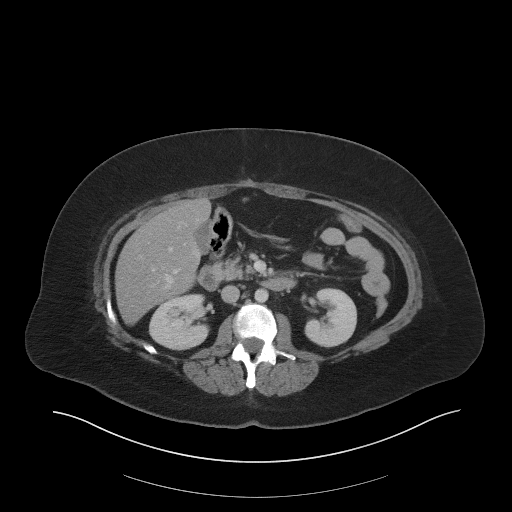
[im 62/93  bone]
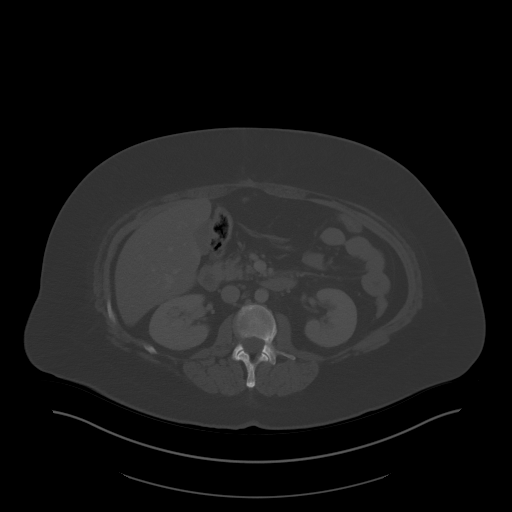
[im 67/93  soft-tissue]
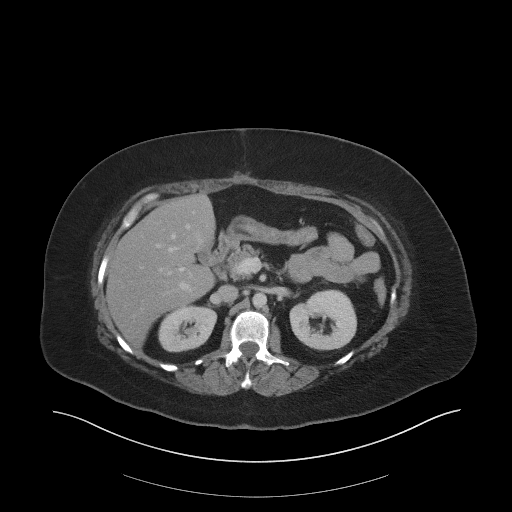
[im 72/93  soft-tissue]
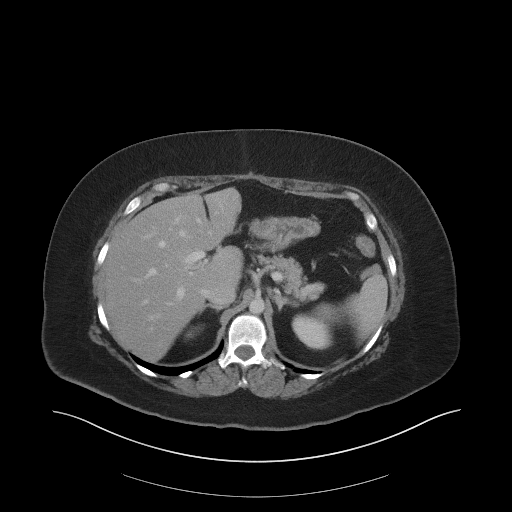
[im 82/93  soft-tissue]
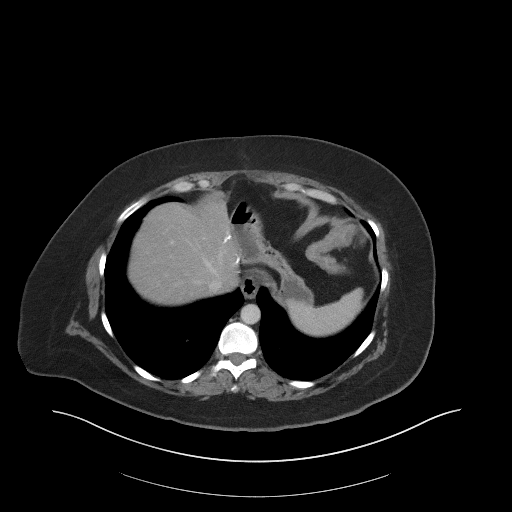
[im 87/93  soft-tissue]
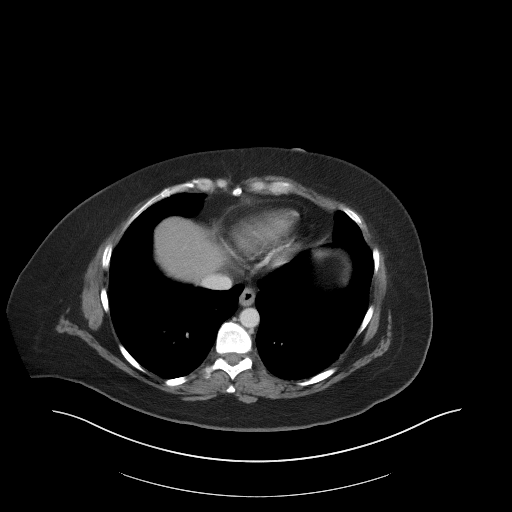

[Series 5: coronal st · coronal · 0.84mm/px · 3 of 96 slices shown]
[im 32/96  soft-tissue]
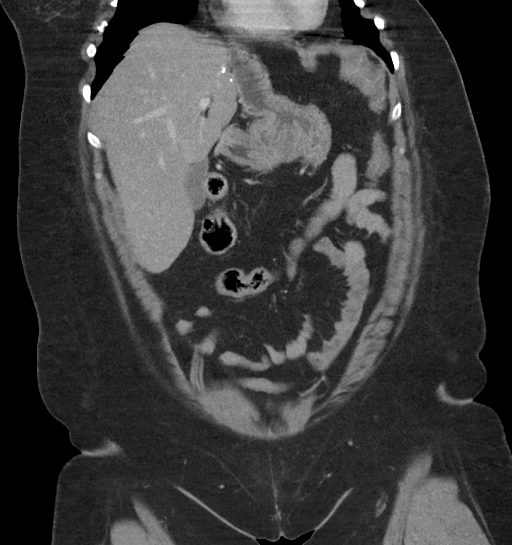
[im 43/96  soft-tissue]
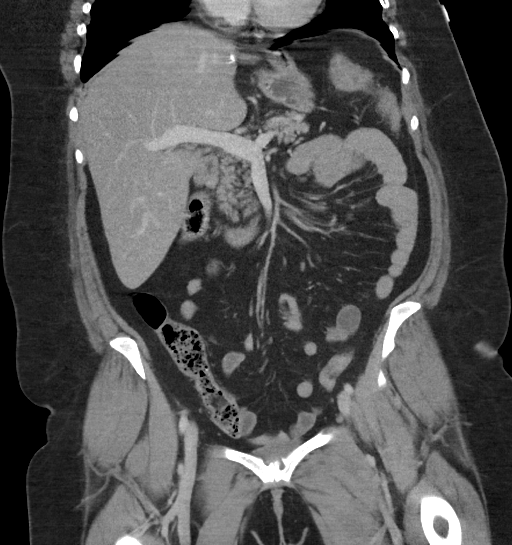
[im 53/96  soft-tissue]
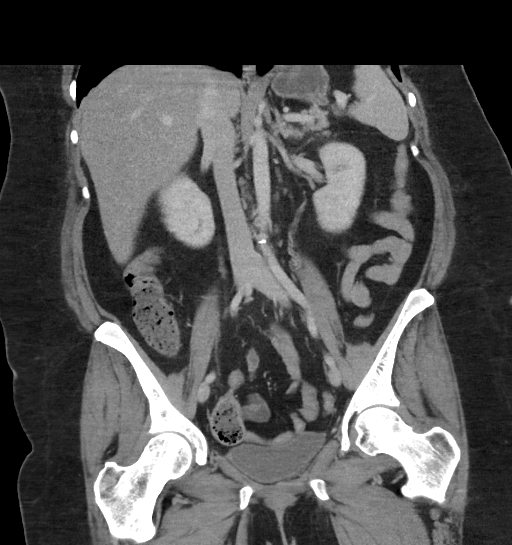

[16 of 46 positions shown; findings below may reference images not displayed]

FINDINGS: Lower chest: Unremarkable.

Hepatobiliary: Suture line along the surface of the left lobe of the
liver, suggesting prior partial hepatectomy. No definite cystic or
solid hepatic lesions. No intra or extrahepatic biliary ductal
dilatation. Gallbladder is normal in appearance.

Pancreas: No pancreatic mass. No pancreatic ductal dilatation. No
pancreatic or peripancreatic fluid or inflammatory changes.

Spleen: Unremarkable.

Adrenals/Urinary Tract: Bilateral kidneys and bilateral adrenal
glands are normal in appearance. No hydroureteronephrosis. Pelvic
floor laxity with low-lying urinary bladder indicative of a
cystocele.

Stomach/Bowel: Stomach appears distorted, with the lesser curvature
immediately abutting the suture line along the surface of the left
lobe of the liver, potentially related to adhesions. No pathologic
dilatation of small bowel or colon. Appendix is mildly dilated
measuring up to 12 mm, within appendicolith present in the lumen.
Trace amount of periappendiceal fluid. No para appendiceal abscess.

Vascular/Lymphatic: Aortic atherosclerosis, without evidence of
aneurysm or dissection in the abdominal or pelvic vasculature. No
lymphadenopathy noted in the abdomen or pelvis.

Reproductive: Status posthysterectomy. Ovaries are unremarkable in
appearance.

Other: No significant volume of ascites.  No pneumoperitoneum.

Musculoskeletal: There are no aggressive appearing lytic or blastic
lesions noted in the visualized portions of the skeleton.
IMPRESSION: 1. The appendix contains an appendicolith and is mildly dilated
measuring up to 12 mm in diameter with a trace amount of
periappendiceal fluid, concerning for early acute appendicitis.
2. Unusual appearance of the lesser curvature of the proximal
stomach which appears adherent to the surface of the left lobe of
the liver where there are some suture lines. The possibility of
adhesions in this region is suspected. At this time, this does not
appear to be associated with obstruction of the proximal stomach.
3. Aortic atherosclerosis.
4. Pelvic floor laxity with cystocele.
5. Additional incidental findings, as above.

## 2017-08-29 NOTE — Unmapped (Signed)
Followup Visit Note    Patient Name: Diane Gonzalez  Patient Age: 41 y.o.  Encounter Date: 08/30/2017    Referring Physician:   Towanda Malkin, MD  [address]  [address] [id number]  [place], [place] [address]    Assessment/Plan:    Reason for Visit  Breast Cancer    Cancer Staging  Breast cancer, left breast (CMS-HCC)  Staging form: Breast, AJCC 7th Edition  - Clinical: ER 98%, PR 92%, HER2 0, not amplified; Gr 2 (Nott 6); BRCA 1/2 w.t. - Unsigned  - Pathologic: Stage IIB (T2, N35mi, cM0) - Signed by Talbert Cage, DO on 01/28/2014        1.  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        -- 07/14/17: PET CT: progression in T1/T5;          -- 08/02/17: Ibrance 125 mg daily     2.  Status post hysterectomy, ovaries/ tubes intact       3.  History of viral encephalitis with coma, with mild residual neurologic symptoms     4.  Lymphedema     5.  Syncope, probable orthostatic; w/u underway with Dr Julio Alm.        -- MRI of CNS without obvious lesion, final radiology report pending  01/19/16        --  EEG with out evidence of seizures.        -- Cardiology evaluation suggests not cardiac in etiology; referred to neurology        -- OSA 06/15/16; recommended positional therapy     6.  Depression; followed by [name];  on Pristiq and Zyprexa; off of gabapentin     7.  Headaches; resolved off of Effexor     8.  H/O meningitis, with brain biopsy       9.  Osteopenia  DEXA   01/19/16     10. [employer]      11. OSA, w/ RLS; recommended positional sleeping, or CPAP    12. S/p Appendectomy, 05/16/17 [facility])    13. Lumbosacral mediated pain, lumbar DDD, possible sciatica          -- 07/17/17: Caudal epidural steroid injection with good pain relief.           Plan:   -- Patient is tolerating Ibrance at 125 mg daily, with only mild increased fatigue.  Cell count check is  acceptable to start cycle # 2 today; plan cell count check at 2 weeks, and prior to starting cycle 3 and 4 weeks.  The patient returns on 09/21/17 for the next Faslodex, Lupron and Xgeva injections..  Plan radiographic restaging of progressive disease in T1 and T5, without CT changes at those levels, after 3-6 months of therapy if clinically stable.   Consider MRI of spine for staging.   -      I have reviewed the laboratory, pathology, and radiology reports in detail and discussed findings with the patient.  The patient had a restaging MRI of the abdomen today. Preliminarily, no new lesions noted in the liver.    Interval History:  The patient returns for follow up. She has experienced a recurrence of the pain in the lower back, radiating into both buttocks.  She had sacral epidural steroid injection on 07/16/17, had been essentially pain-free until 3 days ago.  She has been instructed to call the Pain clinic if her pain recurred.  She started on palbociclib 125 mg 4 weeks ago, and has been tolerating therapy without difficulty. She does feel that she is somewhat more fatigued for several hours after taking the pill along with her evening meal.   Her mouth stays a little dry.  She was nauseous when she first took [place], and became nauseous again, when she held therapy after 3 weeks.She denies nausea, rash, fevers, chills, other malaise, headaches, visual changes, seizures, dyspnea, cough, anorexia, bowel or bladder dysfunction, swollen glands and new pain.  She has no upper back pain.           Breast cancer, left breast (CMS-HCC)    11/22/2013 Initial Diagnosis     Breast cancer, left breast; 4.8 cm left breast mass; suspicious nodes.         11/25/2013 Biopsy     IDC, Gr 2; ER +, PR +, HER2 0/not amplified         12/11/2013 -  Cancer Staged     BRCA 1/2 without mutation         12/31/2013 Surgery     left simple mastectomy and SLND: 4.5 cm primary; DCIS 1 of 5 positive node with 1.3 mm deposit.         01/10/2014 Surgery     ALND: 0 of 8 nodes imvolved.         02/13/2014 - 04/17/2014 Chemotherapy     Taxotere/Cyclophosphamide x 4 cycles         05/07/2014 - 06/27/2014 Radiation     33 fractions.         07/18/2014 - 01/24/2015 Chemotherapy     Tamoxifen. Stopped secondary to side effects.         02/01/2015 -  Chemotherapy     Leuprolide and letrozole         03/02/2015 -  Chemotherapy     Anastrozole in place of letrozole, secondary to nausea; not tolerated. Prescription for exemestane written, but not filled.         11/09/2015 -  Chemotherapy     Exemestane started by the patient         11/09/2015 -  Cancer Staged     Chest CT:  Irregular, mottled, and mildly expansile appearance of left seventh posterior rib may represent osseous metastasis. No lytic or blastic lesions are noted in thoracic spine. Mild degenerative changes of thoracic spine.          11/24/2015 -  Cancer Staged  PET/CT: Nodule in the left liver lobe, corresponds to ring-enhancing lesion present on 11/09/15 chest CT; left seventh rib equivocal for metastatic disease versus fracture. No other significant areas of uptake.         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 -- Two tiny T2 hyperintense lesions in the segment VII and segment VIA ;probable cysts.         02/05/2016 Surgery     Invasive adenocarcinoma, consistent with a metastasis from a breast (ductal) primary (2.5 cm) - Parenchymal resection margin is widely free (2.5 cm to carcinoma) ?????? - Estrogen receptor: Positive (95%, 3+; PR 3+; HER2 0         07/06/2016 -  Cancer Staged     CT [id number]:[id number] sclerotic lesion concerning for metastases in a patient with history of breast cancer. Correlation with nuclear medicine bone scintigraphy is recommended.  -Nodular enhancement in the midline incision as well as in the underlying mesentery is indeterminant and may represent postsurgical change, less likely metastatic disease. Attention on follow-up.         07/15/2016 -  Cancer Staged     MRI L-spine without contrast: Mild degenerative changes in lumbar spine without significant spinal canal or neural foraminal narrowing. No evidence of malignancy.         07/19/2016 -  Cancer Staged     NM Bone Scan:  Persistent uptake in the left posterior seventh rib, likely healing fracture.  - Sclerotic sacral lesion without increased radiotracer uptake.         01/26/2017 -  Cancer Staged     PET-CT:  - Increased uptake at the hepatic resection site, most likely postsurgical change. Recommend clinical correlation. A PET/CT in 3-6 months may be helpful to more fully characterize  - Probable healing fracture in the left seventh rib and treated metastasis in the sacrum. Recommend attention on future scans.          07/06/2017 -  Cancer Staged       MRI L-Spine: Sclerotic lesion at the anterior aspect of S2 with thin rim of peripheral enhancement, suggestive of sclerotic metastasis, unchanged in size from prior study. Additional ill-defined focus of enhancement in the right sacrum adjacent to the S2 neural foramen may also represent a small site of metastasis.  - L5-S1 central disc protrusion with mild central canal narrowing and neural foraminal narrowing.  - Findings suggestive of mild osteitis pubis.  - Left gluteus medius minimus tendinosis/partial tear. Mild right gluteus minimus tendinosis.         07/14/2017 -  Cancer Staged       PET-CT:    Overall progression of disease with new avid FDG uptake in the T1 vertebral body and T5 right pedicle concerning for metastatic disease. Along with interval increase in the FDG uptake in the more focal hepatic lesion when compared to prior imaging.    - Stable lesions in the left seventh rib and sacrum.                    The following portions of the patient's history were reviewed  and updated as appropriate: current medications and problem list.    Review of Systems   All other systems reviewed and are negative.      Vital signs for this encounter:  BSA: 2.03 meters squared  BP 141/81  - Pulse 82  - Temp 36.3 ??C (97.3 ??F) (Oral)  - Resp 18  - Wt 93.9 kg (207 lb)  - SpO2 96%  - BMI 37.85 kg/m??     Physical Exam  Constitutional: She is oriented to person, place, and time. She appears well-developed and well-nourished. No distress.   HENT:   Head: Normocephalic.   Mouth/Throat: Oropharynx is clear and moist.   Eyes: EOM are normal. Pupils are equal, round, and reactive to light.   Neck: No JVD present. No thyromegaly present.   Cardiovascular: Normal rate and normal heart sounds. ??  Pulmonary/Chest: Effort normal and breath sounds normal.   Breast: 07/27/17:??Left chest wall is without nodules or tenderness. The right breast is without suspicious nodules or discharge.   Lymph: No adenopathy in the neck, supraclavicular, axillary or inguinal regions.??  Abdominal: Soft. She exhibits no distension and no mass. There is no tenderness.   Musculoskeletal: She exhibits no edema. No tenderness to percussion over spine, chest wall,  pelvis or hips. No tenderness to palpation over the sacrum, or SI joint regions bilaterally.??   Neurological: She is alert and oriented to person, place, and time. No cranial nerve deficit. She exhibits normal muscle tone. Coordination normal.   Skin: No rash noted.   Psychiatric: She has a normal mood and affect. Her behavior is normal.         Karnofsky/Lansky Performance Status  80, Normal activity with effort; some signs or symptoms of disease (ECOG equivalent 1)     Results:    WBC   Date Value Ref Range Status   08/30/2017 3.3 (L) 4.5 - 11.0 10*9/L Final   10/24/2014 5.8 4.5 - 11.0 10*9/L Final     HGB   Date Value Ref Range Status   08/30/2017 12.8 (L) 13.5 - 16.0 g/dL Final   44/02/4741 59.5 12.0 - 16.0 g/dL Final     HCT   Date Value Ref Range Status   08/30/2017 37.0 36.0 - 46.0 % Final   10/24/2014 37.7 36.0 - 46.0 % Final     Platelet   Date Value Ref Range Status   08/30/2017 182 150 - 440 10*9/L Final   10/24/2014 199 150 - 440 10*9/L Final     LDH   Date Value Ref Range Status   04/23/2015 328 (L) 338 - 610 U/L Final     Creatinine Whole Blood, POC   Date Value Ref Range Status   07/06/2016 0.4 (L) 0.7 - 1.1 mg/dL Final     Creatinine   Date Value Ref Range Status   08/24/2017 0.64 0.60 - 1.00 mg/dL Final     AST   Date Value Ref Range Status   07/27/2017 26 14 - 38 U/L Final   01/22/2015 29 14 - 38 U/L Final     CEA   Date Value Ref Range Status   12/29/2015 1.5 0.0 - 5.0 ng/mL Final   02/06/2014 <0.5 0.0 - 5.0 ng/mL Final     AFP-Tumor Marker   Date Value Ref Range Status   12/29/2015 2.01 <7.51 ng/mL Final

## 2017-08-30 ENCOUNTER — Ambulatory Visit
Admission: RE | Admit: 2017-08-30 | Discharge: 2017-08-30 | Disposition: A | Discharge status: Home / Self Care | Payer: MEDICARE | Attending: Hematology & Oncology | Admitting: Hematology & Oncology

## 2017-08-30 DIAGNOSIS — C50012 Malignant neoplasm of nipple and areola, left female breast: Secondary | ICD-10-CM

## 2017-08-30 DIAGNOSIS — C50912 Malignant neoplasm of unspecified site of left female breast: Secondary | ICD-10-CM

## 2017-08-30 DIAGNOSIS — I890 Lymphedema, not elsewhere classified: Secondary | ICD-10-CM

## 2017-08-30 DIAGNOSIS — Z170 Estrogen receptor positive status [ER+]: Secondary | ICD-10-CM

## 2017-08-30 DIAGNOSIS — C50919 Malignant neoplasm of unspecified site of unspecified female breast: Principal | ICD-10-CM

## 2017-08-30 LAB — CBC W/ AUTO DIFF
BASOPHILS ABSOLUTE COUNT: 0 10*9/L (ref 0.0–0.1)
EOSINOPHILS ABSOLUTE COUNT: 0.1 10*9/L (ref 0.0–0.4)
HEMATOCRIT: 37 % (ref 36.0–46.0)
HEMOGLOBIN: 12.8 g/dL — ABNORMAL LOW (ref 13.5–16.0)
LYMPHOCYTES ABSOLUTE COUNT: 1.4 10*9/L — ABNORMAL LOW (ref 1.5–5.0)
MEAN CORPUSCULAR HEMOGLOBIN: 32.2 pg (ref 26.0–34.0)
MEAN CORPUSCULAR VOLUME: 93.3 fL (ref 80.0–100.0)
MEAN PLATELET VOLUME: 8 fL (ref 7.0–10.0)
MONOCYTES ABSOLUTE COUNT: 0.2 10*9/L (ref 0.2–0.8)
PLATELET COUNT: 182 10*9/L (ref 150–440)
RED BLOOD CELL COUNT: 3.97 10*12/L — ABNORMAL LOW (ref 4.00–5.20)
RED CELL DISTRIBUTION WIDTH: 17.8 % — ABNORMAL HIGH (ref 12.0–15.0)
WBC ADJUSTED: 3.3 10*9/L — ABNORMAL LOW (ref 4.5–11.0)

## 2017-08-30 LAB — RED CELL DISTRIBUTION WIDTH: Lab: 17.8 — ABNORMAL HIGH

## 2017-08-30 MED ORDER — TRAMADOL 50 MG TABLET
ORAL_TABLET | 0 refills | 0 days | Status: CP
Start: 2017-08-30 — End: 2017-11-16

## 2017-08-30 NOTE — Unmapped (Signed)
Labs drawn via [place].    Port flushed and brisk blood return     Pt tolerated well.

## 2017-08-30 NOTE — Unmapped (Addendum)
--   Cell counts are adequate to restart therapy. I would like you to return in 2 weeks for cell count check, and again at 4 weeks, before starting cycle 3.  -- You're also scheduled to return on October 18 for the next Faslodex, Lupron and Xgeva.    -- Let me see you on 10/20/17, when you're getting additional Faslodex and Xgeva, and we will make decisions on adjusting the timing of cell count checks, and when to arrange for restaging imaging.  -- As always, if any new problems or questions arise please not hesitate to contact me. The clinic number is [phone] or you may send a message using My [facility] Chart.

## 2017-08-31 ENCOUNTER — Ambulatory Visit: Admission: RE | Admit: 2017-08-31 | Discharge: 2017-08-31 | Payer: MEDICARE | Admitting: Psychiatry

## 2017-08-31 ENCOUNTER — Ambulatory Visit: Admission: RE | Admit: 2017-08-31 | Discharge: 2017-08-31 | Payer: MEDICARE | Attending: Clinical | Admitting: Clinical

## 2017-08-31 DIAGNOSIS — R110 Nausea: Secondary | ICD-10-CM

## 2017-08-31 DIAGNOSIS — F3341 Major depressive disorder, recurrent, in partial remission: Principal | ICD-10-CM

## 2017-08-31 DIAGNOSIS — F419 Anxiety disorder, unspecified: Secondary | ICD-10-CM

## 2017-08-31 MED ORDER — PRISTIQ 100 MG TABLET,EXTENDED RELEASE
ORAL_TABLET | Freq: Every day | ORAL | 1 refills | 0.00000 days | Status: CP
Start: 2017-08-31 — End: 2017-10-12

## 2017-08-31 MED ORDER — OMEPRAZOLE 20 MG CAPSULE,DELAYED RELEASE
ORAL_CAPSULE | Freq: Every day | ORAL | 3 refills | 0.00000 days
Start: 2017-08-31 — End: 2018-01-30

## 2017-08-31 MED ORDER — ARIPIPRAZOLE 10 MG TABLET
ORAL_TABLET | Freq: Every day | ORAL | 1 refills | 0 days | Status: CP
Start: 2017-08-31 — End: 2017-10-12

## 2017-08-31 NOTE — Unmapped (Signed)
[facility]   Comprehensive Cancer Support Program  Psychotherapy Note  ??  Service Date: September 27,??2018  Location:??outpatient  Clinician:??Jayke Caul M. Huan Pollok, PhD  Service:??25 minutes of supportive psychotherapy  Time with Patient: 25 minutes  ??  Assessment:  Pt is a 41??yo female with a history of stage IIB breast cancer, s/p mastectomy and chemoradiation. I see periodically for psychotherapy to address MDD and facilitate psychosocial adjustment to survivorship; pt has been followed by Psychiatry and was to meet with Dr. Delana Meyer today.  Pt presented today with report of depressive symptoms such low behavioral activation, social isolation, and anhedonia.  Pt denied feeling sad often, SI or thoughts of self-harm.  Even with cancer return, she denied notably heightened anxiety.  Pt remains at a chronically elevated risk for harm to her self and others; however, this is mitigated by her current engagement in treatment, lack of previous suicide attempts in her self or her family, as well as no current thoughts of suicide or harm to others. Cognitive-behavioral therapy offered today with primary aim of increasing behavioral activation; however, hampered by pt arriving to session late.  I recommended that we meet more frequently, but she prefers every six weeks.  ??  Plan:  1. Will see pt for psychotherapy on 10/12/17.  2. Pt followed by Dr. Delana Meyer of Psychiatry; seen today  3. Plan is for pt to implement behavorial plan at home.  4. Pt aware of steps to take should she feel a threat to self or others.  5. Pt has my contact information and knows to reach me as needed.  ??  Subjective:  Pt presented as alert and oriented. ??Affect was flat per usual, but with some affective range and engaged in session.  ??  Pt endorsed continued depressive symptoms, notably low behavioral activation, social isolation, anhedonia, poor sleep, and affected appetite.  She denied feeling sad often, hopelessness, SI or thoughts of self-harm. She also denied being irritability and reported being thankful that she no longer feels grumpy and interacts better with her children.  Pt has been socially isolated and rarely leaves the house, although does have a friend who visits most days.    Pt shared her thoughts on cancer being found in her spine. She said its not what [she] wanted, but denied being overly worried or stressed about it.  She stated her preference no to change meds (which she would tell Dr. Delana Meyer) and not to increase the frequency of our sessions.    I provided support and understanding, and we discussed and processed the pt.'s thoughts and feelings.  Time was spent problem-solving cognitive and behavioral approaches to increasing behavioral activity level and reducing social isolation.  We also reviewed previous aims toward controlling irritability and decreasing self-doubt, and 3) be happier.  ??  Mental Status Exam:  Appearance: ?? ?? Appears stated age??   Motor: ?? ?? No abnormal movements; appeared tired    Speech/Language: ?? ?? Normal rate, volume, tone, fluency    Mood: ?? ?? you know [shrugged] ??   Affect: ?? ?? Mostly flat, but engaged in session   Thought process: ?? ?? Logical, linear, clear, coherent, goal directed ??   Thought content: ?? ?? Pt denied SI/HI or self-harm since last visit    Perceptual disturbances: ?? ?? Behavior not concerning for response to internal stimuli??   Orientation: ?? ?? Oriented to person, place, time, and general circumstances ??   Attention: ?? ?? Able to fully attend without fluctuations  in consciousness ??   Concentration: ?? ?? Able to fully concentrate and attend ??   Memory: ?? ?? Immediate, short-term, long-term, and recall grossly intact ??   Fund of knowledge: ?? ?? Consistent with level of education and development ??   Insight: ?? ?? Intact??   Judgment: ?? ?? Intact ??   Impulse Control: ?? ?? Intact ??   ??  Time Spent: 50 minutes  Dx: 161.09  ??  Diane Bien, PhD   08/31/2017
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
## 2017-08-31 NOTE — Unmapped (Signed)
Comprehensive Cancer Support Program (CCSP) - Psychiatry Outpatient Clinic   After Visit Summary    It was a pleasure to see you today in the Comprehensive Cancer Support Program. [facility]s Comprehensive Cancer Support program (CCSP) is a multidisciplinary program dedicated to helping patients, caregivers and families with cancer treatment, recovery and survivorship.      Helpful numbers:  During business hours, for clinical concerns or scheduling issues please contact Myrene Galas, the CCSP Program Coordinator, at [phone].     For after hours urgent issues, you may call [phone] or call the I need to talk line at [phone]-TALK ([phone]) anytime 24/7.    CCSP Patient and Family Resource Center: [phone].    CCSP Website:  [email or web address]    For prescription refills, please allow at least 24 hours (during business hours, M-F) for providers to call in refills to your pharmacy. We are generally unable to accommodate same-day requests for refills.     If you are taking any controlled substances (such as anxiety or sleep medications), you must use them as the directions say to use them. We generally do not provide early refills over the phone without clear reason, and it would be inappropriate to obtain the medications from other doctors. We routinely use the [place] controlled substance database to monitor prescription drug use.

## 2017-09-13 ENCOUNTER — Ambulatory Visit
Admission: RE | Admit: 2017-09-13 | Discharge: 2017-09-13 | Disposition: A | Discharge status: Home / Self Care | Payer: MEDICARE

## 2017-09-13 DIAGNOSIS — C50012 Malignant neoplasm of nipple and areola, left female breast: Secondary | ICD-10-CM

## 2017-09-13 DIAGNOSIS — C50919 Malignant neoplasm of unspecified site of unspecified female breast: Principal | ICD-10-CM

## 2017-09-13 LAB — CBC W/ AUTO DIFF
BASOPHILS ABSOLUTE COUNT: 0.1 10*9/L (ref 0.0–0.1)
EOSINOPHILS ABSOLUTE COUNT: 0.2 10*9/L (ref 0.0–0.4)
HEMOGLOBIN: 12.9 g/dL — ABNORMAL LOW (ref 13.5–16.0)
LARGE UNSTAINED CELLS: 1 % (ref 0–4)
LYMPHOCYTES ABSOLUTE COUNT: 1.2 10*9/L — ABNORMAL LOW (ref 1.5–5.0)
MEAN CORPUSCULAR HEMOGLOBIN CONC: 33.8 g/dL (ref 31.0–37.0)
MEAN CORPUSCULAR HEMOGLOBIN: 31.8 pg (ref 26.0–34.0)
MEAN PLATELET VOLUME: 7.4 fL (ref 7.0–10.0)
MONOCYTES ABSOLUTE COUNT: 0.1 10*9/L — ABNORMAL LOW (ref 0.2–0.8)
NEUTROPHILS ABSOLUTE COUNT: 2.3 10*9/L (ref 2.0–7.5)
PLATELET COUNT: 254 10*9/L (ref 150–440)
RED BLOOD CELL COUNT: 4.05 10*12/L (ref 4.00–5.20)
RED CELL DISTRIBUTION WIDTH: 17.4 % — ABNORMAL HIGH (ref 12.0–15.0)
WBC ADJUSTED: 3.8 10*9/L — ABNORMAL LOW (ref 4.5–11.0)

## 2017-09-13 LAB — MONOCYTES ABSOLUTE COUNT: Lab: 0.1 — ABNORMAL LOW

## 2017-09-13 NOTE — Unmapped (Signed)
Pt's port accessed - flushed, bld return noted, labs drawn and heplocked. Pt tolerated well.

## 2017-09-14 NOTE — Unmapped (Signed)
[facility] Refill Coordination Note  Specialty Medication(s): IBRANCE 125MG   Additional Medications shipped: JAILEEN JANELLE, DOB: Oct 25, 1976  Phone: [phone] (home) [phone] (work), Alternate phone contact: N/A  Phone or address changes today?: No  All above HIPAA information was verified with patient.  Shipping Address: [address]  [place] [address]   Insurance changes? No    Completed refill call assessment today to schedule patient's medication shipment from the [facility] [phone]).      Confirmed the medication and dosage are correct and have not changed: Yes, regimen is correct and unchanged.    Confirmed patient started or stopped the following medications in the past month:  No, there are no changes reported at this time.    Are you tolerating your medication?:  Huda reports tolerating the medication.    ADHERENCE    Did you miss any doses in the past 4 weeks? No missed doses reported.    FINANCIAL/SHIPPING    Delivery Scheduled: Yes, Expected medication delivery date: 09/21/2017     Landyn did not have any additional questions at this time.    Delivery address validated in [facility] scheduling system: Yes, address listed in [facility] is correct.    We will follow up with patient monthly for standard refill processing and delivery.      Thank you,  Thad Ranger   [facility] Specialty Pharmacist

## 2017-09-19 MED FILL — IBRANCE/125MG/CAPS: IBRANCE/125MG/CAPS | 28 days supply | Qty: 21 | Fill #2

## 2017-09-21 ENCOUNTER — Ambulatory Visit
Admission: RE | Admit: 2017-09-21 | Discharge: 2017-09-21 | Disposition: A | Discharge status: Home / Self Care | Payer: MEDICARE

## 2017-09-21 DIAGNOSIS — C50912 Malignant neoplasm of unspecified site of left female breast: Secondary | ICD-10-CM

## 2017-09-21 DIAGNOSIS — I890 Lymphedema, not elsewhere classified: Principal | ICD-10-CM

## 2017-09-21 DIAGNOSIS — C50012 Malignant neoplasm of nipple and areola, left female breast: Secondary | ICD-10-CM

## 2017-09-21 DIAGNOSIS — Z170 Estrogen receptor positive status [ER+]: Secondary | ICD-10-CM

## 2017-09-21 LAB — CREATININE
CREATININE: 0.66 mg/dL (ref 0.60–1.00)
EGFR MDRD AF AMER: >=60 mL/min/{1.73_m2} (ref >=60)

## 2017-09-21 LAB — ALBUMIN: Albumin:MCnc:Pt:Ser/Plas:Qn:: 4.4

## 2017-09-21 LAB — PHOSPHORUS: Phosphate:MCnc:Pt:Ser/Plas:Qn:: 3.6

## 2017-09-21 LAB — EGFR MDRD AF AMER: Glomerular filtration rate/1.73 sq M.predicted.black:ArVRat:Pt:Ser/Plas/Bld:Qn:Creatinine-based formula (MDRD): >=60

## 2017-09-21 LAB — CALCIUM: Calcium:MCnc:Pt:Ser/Plas:Qn:: 9.2

## 2017-09-21 NOTE — Unmapped (Signed)
Labs completed via [place].     Pt's port accessed - flushed, blood return sluggish, placed heparin in line, labs drawn. Port heplocked and port de-accessed.    Pt received Faslodex, Lupron and Xgeva injection. Faslodex given in left gluteal, Lupron given in right gluteal and Xgeva given in right upper arm. Pt tolerated well.    AVS received at scheduling.    Pt stable and ambulated independently from clinic.

## 2017-09-27 ENCOUNTER — Ambulatory Visit
Admission: RE | Admit: 2017-09-27 | Discharge: 2017-09-27 | Disposition: A | Discharge status: Home / Self Care | Payer: MEDICARE

## 2017-09-27 DIAGNOSIS — C50919 Malignant neoplasm of unspecified site of unspecified female breast: Principal | ICD-10-CM

## 2017-09-27 DIAGNOSIS — C50912 Malignant neoplasm of unspecified site of left female breast: Secondary | ICD-10-CM

## 2017-09-27 LAB — CBC W/ AUTO DIFF
BASOPHILS ABSOLUTE COUNT: 0.1 10*9/L (ref 0.0–0.1)
EOSINOPHILS ABSOLUTE COUNT: 0.2 10*9/L (ref 0.0–0.4)
HEMATOCRIT: 38.4 % (ref 36.0–46.0)
LYMPHOCYTES ABSOLUTE COUNT: 1.1 10*9/L — ABNORMAL LOW (ref 1.5–5.0)
MEAN CORPUSCULAR HEMOGLOBIN CONC: 34.4 g/dL (ref 31.0–37.0)
MEAN CORPUSCULAR HEMOGLOBIN: 32.6 pg (ref 26.0–34.0)
MEAN CORPUSCULAR VOLUME: 94.9 fL (ref 80.0–100.0)
MEAN PLATELET VOLUME: 8.1 fL (ref 7.0–10.0)
MONOCYTES ABSOLUTE COUNT: 0.2 10*9/L (ref 0.2–0.8)
NEUTROPHILS ABSOLUTE COUNT: 1.7 10*9/L — ABNORMAL LOW (ref 2.0–7.5)
PLATELET COUNT: 155 10*9/L (ref 150–440)
RED BLOOD CELL COUNT: 4.05 10*12/L (ref 4.00–5.20)
RED CELL DISTRIBUTION WIDTH: 17.3 % — ABNORMAL HIGH (ref 12.0–15.0)

## 2017-09-27 LAB — NEUTROPHILS ABSOLUTE COUNT: Lab: 1.7 — ABNORMAL LOW

## 2017-09-27 NOTE — Unmapped (Signed)
Labs drawn via [place].    Port flushed, brisk blood return , port heparinized and port de-accessed    Pt tolerated well.    Results reviewed - patient does not have to wait for results. .    Interventions - no intervention needed.    Pt stable and ambulated independently from clinic.

## 2017-10-10 NOTE — Unmapped (Signed)
[facility] Refill Coordination Note    Specialty Program and Medication(s) to be Shipped: IBRANCE  Other medications to be shipped:      Diane Gonzalez, DOB: 21-Feb-1976  Phone: [phone] (home) [phone] (work)  Shipping Address: [address]  [place]  [id number]  All above HIPAA information was verified with patient.     Completed refill call assessment today to schedule patient's medication shipment from the [facility] [phone]).       Medications reviewed and verified:      Specialty medication(s) and dose(s) confirmed: yes  Changes to medications: no  Changes to insurance: no  Tolerating medications:      DISEASE-SPECIFIC INFORMATION        N/A    ADHERENCE      (change to the new smartlinks)    (Below is required for Medicare Part B billed medications only - per drug):   Quantity dispensed last month: N/A  Remaining supply on hand: N/A.      SHIPPING     Delivery Scheduled: no, Expected medication delivery date: 11/12       Diane Gonzalez  [facility]

## 2017-10-12 ENCOUNTER — Ambulatory Visit: Admission: RE | Admit: 2017-10-12 | Discharge: 2017-10-12 | Payer: MEDICARE

## 2017-10-12 ENCOUNTER — Ambulatory Visit: Admission: RE | Admit: 2017-10-12 | Discharge: 2017-10-12 | Payer: MEDICARE | Attending: Clinical | Admitting: Clinical

## 2017-10-12 DIAGNOSIS — F3341 Major depressive disorder, recurrent, in partial remission: Principal | ICD-10-CM

## 2017-10-12 DIAGNOSIS — G4730 Sleep apnea, unspecified: Secondary | ICD-10-CM

## 2017-10-12 DIAGNOSIS — F419 Anxiety disorder, unspecified: Secondary | ICD-10-CM

## 2017-10-12 MED ORDER — PRISTIQ 100 MG TABLET,EXTENDED RELEASE
ORAL_TABLET | Freq: Every day | ORAL | 0 refills | 0 days | Status: CP
Start: 2017-10-12 — End: 2017-12-14

## 2017-10-12 MED ORDER — ARIPIPRAZOLE 10 MG TABLET
ORAL_TABLET | Freq: Every day | ORAL | 0 refills | 0.00000 days | Status: CP
Start: 2017-10-12 — End: 2017-12-14

## 2017-10-12 NOTE — Unmapped (Signed)
Comprehensive Cancer Support Program (CCSP) - Psychiatry Outpatient Clinic   After Visit Summary    It was a pleasure to see you today in the Comprehensive Cancer Support Program. [facility]s Comprehensive Cancer Support program (CCSP) is a multidisciplinary program dedicated to helping patients, caregivers and families with cancer treatment, recovery and survivorship.      Helpful numbers:  During business hours, for clinical concerns or scheduling issues please contact Myrene Galas, the CCSP Program Coordinator, at [phone].     For after hours urgent issues, you may call [phone] or call the I need to talk line at [phone]-TALK ([phone]) anytime 24/7.    CCSP Patient and Family Resource Center: [phone].    CCSP Website:  [email or web address]    For prescription refills, please allow at least 24 hours (during business hours, M-F) for providers to call in refills to your pharmacy. We are generally unable to accommodate same-day requests for refills.     If you are taking any controlled substances (such as anxiety or sleep medications), you must use them as the directions say to use them. We generally do not provide early refills over the phone without clear reason, and it would be inappropriate to obtain the medications from other doctors. We routinely use the [place] controlled substance database to monitor prescription drug use.
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
## 2017-10-13 MED FILL — IBRANCE/125MG/CAPS: IBRANCE/125MG/CAPS | 28 days supply | Qty: 21 | Fill #3

## 2017-10-13 NOTE — Unmapped (Signed)
[facility]   Comprehensive Cancer Support Program  Psychotherapy Note  ??  Service Date: November 8,??2018  Location:??outpatient  Clinician:??Joleah Kosak M. Zennie Ayars, PhD  Service:??25 minutes of supportive psychotherapy  Time with Patient: 25 minutes  ??  Assessment:  Pt is a 41??yo female with a history of stage IIB breast cancer, s/p mastectomy and chemoradiation. I see periodically for psychotherapy to address MDD and facilitate psychosocial adjustment to survivorship; pt is followed by Psychiatry and met with Dr. Delana Meyer today. ??Pt presented today with report of continued depressive symptoms such low behavioral activation, social isolation, anhedonia, some poor mood, and poor sleep. She also reported considerable fatigue. Ms. Dietzman has enacted some behavioral changes since our last session, but led to little relief of depressive symptoms. Pt denies SI or thoughts of self-harm.  Pt remains at a chronically elevated risk for harm to her self and others; however, this is mitigated by her current engagement in treatment, lack of previous suicide attempts in her self or her family, as well as no current thoughts of suicide or harm to others. Cognitive-behavioral therapy offered today with primary aim of alleviating depression.  I have recommended more frequent sessions, but she prefers every six weeks.  ??  Plan:  1. Will see pt for psychotherapy on 12/14/17.  2. Pt followed by Dr. Delana Meyer of Psychiatry who met with her today.  Pt stated that she was going to talk with Dr. Delana Meyer today - and then Dr. Claude Manges next week - about ways to address fatigue.  3. Plan is for pt??to implement modified behavorial plan at home.  4. Pt aware of steps to take should she feel a threat to self or others.  5. Pt has my contact information and knows to reach me as needed.  ??  Subjective:  Pt presented as alert and oriented. ??Affect was flat and dysthymic, and pt appeared tired.  ??  Pt initially stated that she was doing ok with regard to her psychosocial functioning; however, she reported that the intensity and frequency of her depressive symptoms has been largely unchanged since our last session.  She again endorsed notably low behavioral activation, social isolation, and some poor mood at times.  Pt reported significant fatigue that impacts her ability to carry out ADLs and likely is associated with anhedonia.  MS. Corrie denied experiencing significant /hopelessness, worthlessness, tearfulness or SI/HI. She remains grateful that her irritability has remained decreased and largely manageable.  ??  I provided support and understanding, and we discussed and processed the pt.'s thoughts and feelings. ??Ms. Chea has enacted some behavioral changes since our last session, but led to little relief of depressive symptoms.  As such, time was spent reviewing and problem-solving cognitive and behavioral approaches to increasing behavioral activity level and reducing social isolation. ??Pt was receptive and attentive, although I did not sense a great desire or impetus to change.  Overall, pt's distress level - relative to recent past - is not extremely high, thus there may be less motivation to change. I offered to increase frequency of sessions, however, pt continues to wish to meet every 6-8 weeks.    I will see pt next on 12/14/17.  ??  Mental Status Exam:  Appearance: ?? ?? Appears stated age??   Motor: ?? ?? No abnormal movements; appeared tired    Speech/Language: ?? ?? Normal rate, volume, tone, fluency    Mood: ?? ?? good   Affect: ?? ?? Dysthymic, but willing to meet   Thought  process: ?? ?? Logical, linear, clear, coherent, goal-directed   Thought content: ?? ?? Pt denied SI/HI or self-harm since last visit    Perceptual disturbances: ?? ?? Behavior not concerning for response to internal stimuli??   Orientation: ?? ?? Oriented to person, place, time, and general circumstances ??   Attention: ?? ?? Able to fully attend without fluctuations in consciousness Concentration: ?? ?? Able to fully concentrate and attend ??   Memory: ?? ?? Immediate, short-term, long-term, and recall grossly intact ??   Fund of knowledge: ?? ?? Consistent with level of education and development ??   Insight: ?? ?? Intact??   Judgment: ?? ?? Intact ??   Impulse Control: ?? ?? Intact ??   ??  Time Spent: 50 minutes  Dx: 161.09  ??  Fanny Bien, PhD   10/13/2017

## 2017-10-17 NOTE — Unmapped (Addendum)
Followup Visit Note    Patient Name: Diane Gonzalez  Patient Age: 41 y.o.  Encounter Date: 10/19/2017    Referring Physician:   Towanda Malkin, MD  [address]  [id number] [id number]  [place], [place] [address]    Assessment/Plan:    Reason for Visit  Breast Cancer    Cancer Staging  Breast cancer, left breast (CMS-HCC)  Staging form: Breast, AJCC 7th Edition  - Clinical: ER 98%, PR 92%, HER2 0, not amplified; Gr 2 (Nott 6); BRCA 1/2 w.t. - Unsigned  - Pathologic: Stage IIB (T2, N33mi, cM0) - Signed by Talbert Cage, DO on 01/28/2014        1.  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        -- 07/14/17: PET CT: progression in T1/T5;          -- 08/02/17: Ibrance 125 mg daily     2.  Status post hysterectomy, ovaries/ tubes intact       3.  History of viral encephalitis with coma, with mild residual neurologic symptoms     4.  Lymphedema     5.  Syncope, probable orthostatic; w/u underway with Dr Julio Alm.        -- MRI of CNS without obvious lesion, final radiology report pending  01/19/16        --  EEG with out evidence of seizures.        -- Cardiology evaluation suggests not cardiac in etiology; referred to neurology        -- OSA 06/15/16; recommended positional therapy     6.  Depression; followed by [name];  on Pristiq and Zyprexa; off of gabapentin     7.  Headaches; resolved off of Effexor     8.  H/O meningitis, with brain biopsy       9.  Osteopenia  DEXA   01/19/16     10. [employer]      11. OSA, w/ RLS; recommended positional sleeping, or CPAP    12. S/p Appendectomy, [facility]/16/17 [facility])    13. Lumbosacral mediated pain, lumbar DDD, possible sciatica          -- 07/17/17: Caudal epidural steroid injection with good pain relief.           Plan:   -- Patient is tolerating Ibrance at 125 mg daily, with only mild increased fatigue.  Cell count check is  acceptable to start cycle # 2 today; plan cell count check at 2 weeks, and prior to starting cycle 3 and 4 weeks.  The patient returns on 09/21/17 for the next Faslodex, Lupron and Xgeva injections..  Plan radiographic restaging of progressive disease in T1 and T5, without CT changes at those levels, after 3-6 months of therapy if clinically stable.   Consider MRI of spine for staging.   -      I have reviewed the laboratory, pathology, and radiology reports in detail and discussed findings with the patient.  The patient had a restaging MRI of the abdomen today. Preliminarily, no new lesions noted in the liver.    Interval History:  The patient returns for follow up. She is fatigued, and has symptoms of a mild URI, but is otherwise doing well.  The lower back pain is doing better, and while it still comes and goes, she has not had any significant pain for the past 3 days.  The upper respiratory infection symptoms came on after her daughter had a sore throat. Symptoms include clear drainage from the nose, and in expectorated material. She denies fevers or shaking chills.She denies nausea, rash, fevers, chills, other malaise, headaches, visual changes, seizures, dyspnea, cough, anorexia, bowel or bladder dysfunction, swollen glands and new pain.  She has no upper back or chest wall pain.           Breast cancer, left breast (CMS-HCC)    11/22/2013 Initial Diagnosis     Breast cancer, left breast; 4.8 cm left breast mass; suspicious nodes.         11/25/2013 Biopsy     IDC, Gr 2; ER +, PR +, HER2 0/not amplified         12/11/2013 -  Cancer Staged     BRCA 1/2 without mutation         12/31/2013 Surgery     left simple mastectomy and SLND: 4.5 cm primary; DCIS 1 of 5 positive node with 1.3 mm deposit.         01/10/2014 Surgery     ALND: 0 of 8 nodes imvolved.         02/13/2014 - 04/17/2014 Chemotherapy     Taxotere/Cyclophosphamide x 4 cycles         05/07/2014 - 06/27/2014 Radiation     33 fractions.         07/18/2014 - 01/24/2015 Chemotherapy     Tamoxifen. Stopped secondary to side effects.         02/01/2015 -  Chemotherapy     Leuprolide and letrozole         03/02/2015 -  Chemotherapy     Anastrozole in place of letrozole, secondary to nausea; not tolerated. Prescription for exemestane written, but not filled.         11/09/2015 -  Chemotherapy     Exemestane started by the patient         11/09/2015 -  Cancer Staged     Chest CT:  Irregular, mottled, and mildly expansile appearance of left seventh posterior rib may represent osseous metastasis. No lytic or blastic lesions are noted in thoracic spine. Mild degenerative changes of thoracic spine.          11/24/2015 -  Cancer Staged     PET/CT: Nodule in the left liver lobe, corresponds to ring-enhancing lesion present on 11/09/15 chest CT; left seventh rib equivocal for metastatic disease versus fracture. No other significant areas of uptake.  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 -- Two tiny T2 hyperintense lesions in the segment VII and segment VIA ;probable cysts.         02/05/2016 Surgery     Invasive adenocarcinoma, consistent with a metastasis from a breast (ductal) primary (2.5 cm) - Parenchymal resection margin is widely free (2.5 cm to carcinoma) ?????? - Estrogen receptor: Positive (95%, 3+; PR 3+; HER2 0         03/28/2016 -  Chemotherapy     Fulvestrant 500 mg         07/06/2016 -  Cancer Staged     CT ZOX:[id number] sclerotic lesion concerning for metastases in a patient with history of breast cancer. Correlation with nuclear medicine bone scintigraphy is recommended.  -Nodular enhancement in the midline incision as well as in the underlying mesentery is indeterminant and may represent postsurgical change, less likely metastatic disease. Attention on follow-up.         07/15/2016 -  Cancer Staged     MRI L-spine without contrast: Mild degenerative changes in lumbar spine without significant spinal canal or neural foraminal narrowing. No evidence of malignancy.         07/19/2016 -  Cancer Staged     NM Bone Scan:  Persistent uptake in the left posterior seventh rib, likely healing fracture.  - Sclerotic sacral lesion without increased radiotracer uptake.         01/26/2017 -  Cancer Staged     PET-CT:  - Increased uptake at the hepatic resection site, most likely postsurgical change. Recommend clinical correlation. A PET/CT in 3-6 months may be helpful to more fully characterize  - Probable healing fracture in the left seventh rib and treated metastasis in the sacrum. Recommend attention on future scans.          07/06/2017 -  Cancer Staged       MRI L-Spine: Sclerotic lesion at the anterior aspect of S2 with thin rim of peripheral enhancement, suggestive of sclerotic metastasis, unchanged in size from prior study. Additional ill-defined focus of enhancement in the right sacrum adjacent to the S2 neural foramen may also represent a small site of metastasis.  - L5-S1 central disc protrusion with mild central canal narrowing and neural foraminal narrowing.  - Findings suggestive of mild osteitis pubis.  - Left gluteus medius minimus tendinosis/partial tear. Mild right gluteus minimus tendinosis.         07/14/2017 -  Cancer Staged       PET-CT:    Overall progression of disease with new avid FDG uptake in the T1 vertebral body and T5 right pedicle concerning for metastatic disease. Along with interval increase in the FDG uptake in the more focal hepatic lesion when compared to prior imaging.    - Stable lesions in the left seventh rib and sacrum.                 07/27/2017 -  Chemotherapy     Palbociclib  125 mg for 21 of 28 days.            The following portions of  the patient's history were reviewed and updated as appropriate: current medications and problem list.    Review of Systems   All other systems reviewed and are negative.      Vital signs for this encounter:  BSA: 2.06 meters squared  BP 165/101  - Pulse 105  - Temp 36.5 ??C (97.7 ??F)  - Wt 96.8 kg (213 lb 6.4 oz)  - SpO2 96%  - BMI 39.02 kg/m??      Repeat BP with the manual cuff:  142/98    Physical Exam  Constitutional: She is oriented to person, place, and time. She appears well-developed and well-nourished. No distress.   HENT:   Head: Normocephalic.   Mouth/Throat: Oropharynx is clear and moist. Scant clear drainage in the pharynx  Eyes: EOM are normal. Pupils are equal, round, and reactive to light.   Neck: No JVD present. No thyromegaly present.   Cardiovascular: Normal rate and normal heart sounds. ??  Pulmonary/Chest: Effort normal and breath sounds normal.   Breast: 10/19/17:??Left chest wall is without nodules or tenderness. The right breast is without suspicious nodules or discharge.   Lymph: No adenopathy in the neck, supraclavicular, axillary or inguinal regions.??  Abdominal: Soft. She exhibits no distension and no mass. There is no tenderness.   Musculoskeletal: She exhibits no edema. No tenderness to percussion over spine, chest wall, pelvis or hips. No tenderness to palpation over the sacrum, or SI joint regions bilaterally.??   Neurological: She is alert and oriented to person, place, and time. No cranial nerve deficit. She exhibits normal muscle tone. Coordination normal.   Skin: Mild candidal skin infection in the groin.  Healing follicular abscess on the right breast  Psychiatric: She has a normal mood and affect. Her behavior is normal.         Karnofsky/Lansky Performance Status  80, Normal activity with effort; some signs or symptoms of disease (ECOG equivalent 1)     Results:    WBC   Date Value Ref Range Status   09/27/2017 3.3 (L) 4.5 - 11.0 10*9/L Final   10/24/2014 5.8 4.5 - 11.0 10*9/L Final     HGB   Date Value Ref Range Status   09/27/2017 13.2 (L) 13.5 - 16.0 g/dL Final   09/81/1914 78.2 12.0 - 16.0 g/dL Final     HCT   Date Value Ref Range Status   09/27/2017 38.4 36.0 - 46.0 % Final   10/24/2014 37.7 36.0 - 46.0 % Final     Platelet   Date Value Ref Range Status   09/27/2017 155 150 - 440 10*9/L Final   10/24/2014 199 150 - 440 10*9/L Final     LDH   Date Value Ref Range Status   04/23/2015 328 (L) 338 - 610 U/L Final     Creatinine Whole Blood, POC   Date Value Ref Range Status   07/06/2016 0.4 (L) 0.7 - 1.1 mg/dL Final     Creatinine   Date Value Ref Range Status   09/21/2017 0.66 0.60 - 1.00 mg/dL Final     AST   Date Value Ref Range Status   07/27/2017 26 14 - 38 U/L Final   01/22/2015 29 14 - 38 U/L Final     CEA   Date Value Ref Range Status   12/29/2015 1.5 0.0 - 5.0 ng/mL Final   02/06/2014 <0.5 0.0 - 5.0 ng/mL Final     AFP-Tumor Marker   Date Value Ref Range Status   12/29/2015  2.01 <7.51 ng/mL Final

## 2017-10-19 ENCOUNTER — Ambulatory Visit
Admission: RE | Admit: 2017-10-19 | Discharge: 2017-10-19 | Disposition: A | Discharge status: Home / Self Care | Payer: MEDICARE

## 2017-10-19 ENCOUNTER — Ambulatory Visit
Admission: RE | Admit: 2017-10-19 | Discharge: 2017-10-19 | Disposition: A | Discharge status: Home / Self Care | Payer: MEDICARE | Attending: Hematology & Oncology | Admitting: Hematology & Oncology

## 2017-10-19 DIAGNOSIS — I890 Lymphedema, not elsewhere classified: Principal | ICD-10-CM

## 2017-10-19 DIAGNOSIS — Z170 Estrogen receptor positive status [ER+]: Secondary | ICD-10-CM

## 2017-10-19 DIAGNOSIS — C50912 Malignant neoplasm of unspecified site of left female breast: Secondary | ICD-10-CM

## 2017-10-19 DIAGNOSIS — C50919 Malignant neoplasm of unspecified site of unspecified female breast: Secondary | ICD-10-CM

## 2017-10-19 DIAGNOSIS — C50012 Malignant neoplasm of nipple and areola, left female breast: Secondary | ICD-10-CM

## 2017-10-19 DIAGNOSIS — C50112 Malignant neoplasm of central portion of left female breast: Secondary | ICD-10-CM

## 2017-10-19 LAB — COMPREHENSIVE METABOLIC PANEL
ALBUMIN: 4 g/dL (ref 3.5–5.0)
ALKALINE PHOSPHATASE: 90 U/L (ref 38–126)
ALT (SGPT): 43 U/L (ref 15–48)
ANION GAP: 8 mmol/L — ABNORMAL LOW (ref 9–15)
AST (SGOT): 24 U/L (ref 14–38)
BILIRUBIN TOTAL: 0.2 mg/dL (ref 0.0–1.2)
BLOOD UREA NITROGEN: 9 mg/dL (ref 7–21)
BUN / CREAT RATIO: 13
CALCIUM: 8.9 mg/dL (ref 8.5–10.2)
CHLORIDE: 106 mmol/L (ref 98–107)
CO2: 26 mmol/L (ref 22.0–30.0)
CREATININE: 0.68 mg/dL (ref 0.60–1.00)
EGFR MDRD AF AMER: >=60 mL/min/{1.73_m2} (ref >=60)
EGFR MDRD NON AF AMER: >=60 mL/min/{1.73_m2} (ref >=60)
GLUCOSE RANDOM: 123 mg/dL (ref 65–179)
POTASSIUM: 3.6 mmol/L (ref 3.5–5.0)

## 2017-10-19 LAB — CBC W/ AUTO DIFF
BASOPHILS ABSOLUTE COUNT: 0 10*9/L (ref 0.0–0.1)
EOSINOPHILS ABSOLUTE COUNT: 0.2 10*9/L (ref 0.0–0.4)
HEMOGLOBIN: 12.7 g/dL — ABNORMAL LOW (ref 13.5–16.0)
LARGE UNSTAINED CELLS: 3 % (ref 0–4)
LYMPHOCYTES ABSOLUTE COUNT: 0.9 10*9/L — ABNORMAL LOW (ref 1.5–5.0)
MEAN CORPUSCULAR HEMOGLOBIN: 33.1 pg (ref 26.0–34.0)
MEAN CORPUSCULAR VOLUME: 95.7 fL (ref 80.0–100.0)
MEAN PLATELET VOLUME: 8.3 fL (ref 7.0–10.0)
MONOCYTES ABSOLUTE COUNT: 0.1 10*9/L — ABNORMAL LOW (ref 0.2–0.8)
NEUTROPHILS ABSOLUTE COUNT: 1.5 10*9/L — ABNORMAL LOW (ref 2.0–7.5)
PLATELET COUNT: 150 10*9/L (ref 150–440)
RED BLOOD CELL COUNT: 3.85 10*12/L — ABNORMAL LOW (ref 4.00–5.20)
RED CELL DISTRIBUTION WIDTH: 17 % — ABNORMAL HIGH (ref 12.0–15.0)
WBC ADJUSTED: 2.7 10*9/L — ABNORMAL LOW (ref 4.5–11.0)

## 2017-10-19 LAB — ALBUMIN: Albumin:MCnc:Pt:Ser/Plas:Qn:: 4

## 2017-10-19 LAB — POTASSIUM: Potassium:SCnc:Pt:Ser/Plas:Qn:: 3.6

## 2017-10-19 LAB — CREATININE: EGFR MDRD NON AF AMER: >=60 mL/min/{1.73_m2} (ref >=60)

## 2017-10-19 LAB — PHOSPHORUS: Phosphate:MCnc:Pt:Ser/Plas:Qn:: 3.4

## 2017-10-19 LAB — CALCIUM: Calcium:MCnc:Pt:Ser/Plas:Qn:: 8.9

## 2017-10-19 LAB — EGFR MDRD NON AF AMER: Glomerular filtration rate/1.73 sq M.predicted.non black:ArVRat:Pt:Ser/Plas/Bld:Qn:Creatinine-based formula (MDRD): >=60

## 2017-10-19 LAB — ANISOCYTOSIS

## 2017-10-19 NOTE — Unmapped (Signed)
VS taken.    Labs completed via [place].     Pt received Faslodex, Lupron and Xgeva injection in right gluteal, left gluteal and back or right arm - pt tolerated well.    AVS given.    Pt stable and ambulated independently from clinic.

## 2017-10-19 NOTE — Unmapped (Signed)
--   Continue on Ibrance 125 mg daily for 21 out of 28 days. You may restart the medication on Wednesday, November 21.  -- We will plan to see you back in one month, with a PET/CT. If the results are favorable, we may consider changing Xgeva to every other month.  -- Please not hesitate to contact us if any new problems or questions arise. May call the clinic at [phone] or send a message using email feature of [facility] Chart.   -- For the skin rash, once the infection is controlled with ketoconazole, try applying a driving powder that contain zinc oxide (for example [employer] Medicated Powder), to keep any infection from recurring.

## 2017-10-19 NOTE — Unmapped (Signed)
Labs drawn via [place].    Port flushed and brisk blood return     Pt tolerated well.

## 2017-10-23 MED ORDER — OMEPRAZOLE 20 MG CAPSULE,DELAYED RELEASE
ORAL_CAPSULE | Freq: Every day | ORAL | 3 refills | 0.00000 days | Status: CP
Start: 2017-10-23 — End: 2018-03-22

## 2017-11-08 NOTE — Unmapped (Signed)
[facility] Refill Coordination Note  Specialty Medication(s): Ibrance 125mg       Diane Gonzalez, DOB: 09/25/1976  Phone: [phone] (home) [phone] (work), Alternate phone contact: N/A  Phone or address changes today?: No  All above HIPAA information was verified with patient.  Shipping Address: [address]  [place] [address]   Insurance changes? No    Completed refill call assessment today to schedule patient's medication shipment from the [facility] [phone]).      Confirmed the medication and dosage are correct and have not changed: Yes, regimen is correct and unchanged.    Confirmed patient started or stopped the following medications in the past month:  No, there are no changes reported at this time.    Are you tolerating your medication?:  Diane Gonzalez reports side effects of fatigue.    ADHERENCE    Is this medicine transplant or covered by Medicare Part B? No.        Did you miss any doses in the past 4 weeks? No missed doses reported.    FINANCIAL/SHIPPING    Delivery Scheduled: Yes, Expected medication delivery date: 11/17/17     Diane Gonzalez did not have any additional questions at this time.    Delivery address validated in [facility] scheduling system: Yes, address listed in [facility] is correct.    We will follow up with patient monthly for standard refill processing and delivery.      Thank you,  [employer]   [facility] Specialty Pharmacist

## 2017-11-15 MED FILL — IBRANCE/125MG/CAPS: IBRANCE/125MG/CAPS | 28 days supply | Qty: 21 | Fill #4

## 2017-11-15 NOTE — Unmapped (Signed)
Followup Visit Note    Patient Name: Diane Gonzalez  Patient Age: 41 y.o.  Encounter Date: 11/16/2017    Referring Physician:   Towanda Malkin, MD  [address]  [address] [facility] [id number]  [place], [place] [address]    Assessment/Plan:    Reason for Visit  Breast Cancer    Cancer Staging  Breast cancer, left breast (CMS-HCC)  Staging form: Breast, AJCC 7th Edition  - Clinical: ER 98%, PR 92%, HER2 0, not amplified; Gr 2 (Nott 6); BRCA 1/2 w.t. - Unsigned  - Pathologic: Stage IIB (T2, N54mi, cM0) - Signed by Talbert Cage, DO on 01/28/2014        1.  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        -- 07/14/17: PET CT: progression in T1/T5;          -- 08/02/17: Ibrance 125 mg daily     2.  Status post hysterectomy, ovaries/ tubes intact       3.  History of viral encephalitis with coma, with mild residual neurologic symptoms     4.  Lymphedema     5.  Syncope, probable orthostatic; w/u underway with Dr Julio Alm.        -- MRI of CNS without obvious lesion, final radiology report pending  01/19/16        --  EEG with out evidence of seizures.        -- Cardiology evaluation suggests not cardiac in etiology; referred to neurology        -- OSA 06/15/16; recommended positional therapy     6.  Depression; followed by [name];  on Pristiq and Zyprexa; off of gabapentin     7.  Headaches; resolved off of Effexor     8.  H/O meningitis, with brain biopsy       9.  Osteopenia  DEXA   01/19/16     10. [employer]      11. OSA, w/ RLS; recommended positional sleeping, or CPAP    12. S/p Appendectomy, 05/16/17 [facility])    13. Lumbosacral mediated pain, lumbar DDD, possible sciatica          -- 07/17/17: Caudal epidural steroid injection with good pain relief.           Plan:   -- Patient is tolerating Ibrance at 125 mg daily (with fulvestrant, Xgeva and Lupron), with only mild increased fatigue.  -  --  PET/CT today report suggest progression of bony lesions, but oddly without CT correlate, and without FDG avidity visible on the images available for review in PACS.  In addition, site that was previously strongly positive, is no longer positive and an area with biopsy suggesting a non-pathologic fracture (and seventh rib) is described as being FDG avid.  These issues were discussed.  ALP is pending.  Assuming this is not risen significantly, our plan will be to continue on current treatment regimen and repeat a bone scan in 1 month to further document disease extent.  If there is progression, would consider change in hormonal therapy, and possibly switching from palbociclib to Afinitor.  In addition, the patient will call if her bone pain worsens.      I have reviewed the laboratory, pathology, and radiology reports in detail and discussed findings with the patient.  The patient had a restaging MRI of the abdomen today. Preliminarily, no new lesions noted in the liver.    Interval History:  The patient returns for follow up. She is fatigued, and has had a mild increase in poorly localized back pain.  She is taken some tramadol in the past week.  She denies fevers or shaking chills.She denies nausea, rash, fevers, chills, other malaise, headaches, visual changes, seizures, dyspnea, cough, anorexia, bowel or bladder dysfunction, swollen glands and new pain.  She has no upper back or chest wall pain.           Breast cancer, left breast (CMS-HCC)    11/22/2013 Initial Diagnosis     Breast cancer, left breast; 4.8 cm left breast mass; suspicious nodes.         11/25/2013 Biopsy     IDC, Gr 2; ER +, PR +, HER2 0/not amplified         12/11/2013 -  Cancer Staged     BRCA 1/2 without mutation         12/31/2013 Surgery     left simple mastectomy and SLND: 4.5 cm primary; DCIS 1 of 5 positive node with 1.3 mm deposit.         01/10/2014 Surgery     ALND: 0 of 8 nodes imvolved.         02/13/2014 - 04/17/2014 Chemotherapy     Taxotere/Cyclophosphamide x 4 cycles         05/07/2014 - 06/27/2014 Radiation     33 fractions.         07/18/2014 - 01/24/2015 Chemotherapy     Tamoxifen. Stopped secondary to side effects.         02/01/2015 -  Chemotherapy     Leuprolide and letrozole         03/02/2015 -  Chemotherapy     Anastrozole in place of letrozole, secondary to nausea; not tolerated. Prescription for exemestane written, but not filled.         11/09/2015 -  Chemotherapy     Exemestane started by the patient         11/09/2015 -  Cancer Staged     Chest CT:  Irregular, mottled, and mildly expansile appearance of left seventh posterior rib may represent osseous metastasis. No lytic or blastic lesions are noted in thoracic spine. Mild degenerative changes of thoracic spine.          11/24/2015 -  Cancer Staged     PET/CT: Nodule in the left liver lobe, corresponds to ring-enhancing lesion present on 11/09/15 chest CT; left seventh rib equivocal for metastatic  disease versus fracture. No other significant areas of uptake.         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 -- Two tiny T2 hyperintense lesions in the segment VII and segment VIA ;probable cysts.         02/05/2016 Surgery     Invasive adenocarcinoma, consistent with a metastasis from a breast (ductal) primary (2.5 cm) - Parenchymal resection margin is widely free (2.5 cm to carcinoma) ?????? - Estrogen receptor: Positive (95%, 3+; PR 3+; HER2 0         03/28/2016 -  Chemotherapy     Fulvestrant 500 mg         07/06/2016 -  Cancer Staged     CT AOZ:HYQMVH sclerotic lesion concerning for metastases in a patient with history of breast cancer. Correlation with nuclear medicine bone scintigraphy is recommended.  -Nodular enhancement in the midline incision as well as in the underlying mesentery is indeterminant and may represent postsurgical change, less likely metastatic disease. Attention on follow-up.         07/15/2016 -  Cancer Staged     MRI L-spine without contrast: Mild degenerative changes in lumbar spine without significant spinal canal or neural foraminal narrowing. No evidence of malignancy.         07/19/2016 -  Cancer Staged     NM Bone Scan:  Persistent uptake in the left posterior seventh rib, likely healing fracture.  - Sclerotic sacral lesion without increased radiotracer uptake.         01/26/2017 -  Cancer Staged     PET-CT:  - Increased uptake at the hepatic resection site, most likely postsurgical change. Recommend clinical correlation. A PET/CT in 3-6 months may be helpful to more fully characterize  - Probable healing fracture in the left seventh rib and treated metastasis in the sacrum. Recommend attention on future scans.          07/06/2017 -  Cancer Staged       MRI L-Spine: Sclerotic lesion at the anterior aspect of S2 with thin rim of peripheral enhancement, suggestive of sclerotic metastasis, unchanged in size from prior study. Additional ill-defined focus of enhancement in the right sacrum adjacent to the S2 neural foramen may also represent a small site of metastasis.  - L5-S1 central disc protrusion with mild central canal narrowing and neural foraminal narrowing.  - Findings suggestive of mild osteitis pubis.  - Left gluteus medius minimus tendinosis/partial tear. Mild right gluteus minimus tendinosis.         07/14/2017 -  Cancer Staged       PET-CT:    Overall progression of disease with new avid FDG uptake in the T1 vertebral body and T5 right pedicle concerning for metastatic disease. Along with interval increase in the FDG uptake in the more focal hepatic lesion when compared to prior imaging.    - Stable lesions in the left seventh rib and sacrum.                 07/27/2017 -  Chemotherapy     Palbociclib  125 mg for 21 of 28  days.         11/16/2017 -  Cancer Staged       PET/CT:  Overall progression of disease with new osseous metastases in the T8, T11 vertebral body and the right sacrum along with progression of previously identified osseous metastases, including T7.  Note that these areas of uptake are not seen in PACS (visible only on radiology department software), did not have CT scan correlate.    - Interval resolution of the previously noted hepatic lesion adjacent the suture line however there is a new ill-defined hypermetabolic hepatic focus without evidence CT correlation. Recommend attention on follow-up imaging.            The following portions of the patient's history were reviewed and updated as appropriate: current medications and problem list.    Review of Systems   All other systems reviewed and are negative.      Vital signs for this encounter:  BSA: 2.07 meters squared  BP 177/90  - Pulse 90  - Temp 36.9 ??C (98.4 ??F) (Temporal)  - Wt 97.5 kg (214 lb 14.4 oz)  - SpO2 97%  - BMI 39.30 kg/m??      Repeat BP with the manual cuff:  142/98    Physical Exam  Constitutional: She is oriented to person, place, and time. She appears well-developed and well-nourished. No distress.   HENT:   Head: Normocephalic.   Mouth/Throat: Oropharynx is clear and moist. Scant clear drainage in the pharynx  Eyes: EOM are normal. Pupils are equal, round, and reactive to light.   Neck: No JVD present. No thyromegaly present.   Cardiovascular: Normal rate and normal heart sounds. ??  Pulmonary/Chest: Effort normal and breath sounds normal.   Breast: 10/19/17:??Left chest wall is without nodules or tenderness. The right breast is without suspicious nodules or discharge.   Lymph: No adenopathy in the neck, supraclavicular, axillary or inguinal regions.??  Abdominal: Soft. She exhibits no distension and no mass. There is no tenderness.   Musculoskeletal: She exhibits no edema. No tenderness to percussion over spine, chest wall,  pelvis or hips. No tenderness to palpation over the sacrum, or SI joint regions bilaterally.??   Neurological: She is alert and oriented to person, place, and time. No cranial nerve deficit. She exhibits normal muscle tone. Coordination normal.   Skin: Mild candidal skin infection in the groin.  Healing follicular abscess on the right breast  Psychiatric: She has a normal mood and affect. Her behavior is normal.         Karnofsky/Lansky Performance Status  80, Normal activity with effort; some signs or symptoms of disease (ECOG equivalent 1)     Results:    WBC   Date Value Ref Range Status   11/16/2017 3.2 (L) 4.5 - 11.0 10*9/L Final   10/24/2014 5.8 4.5 - 11.0 10*9/L Final     HGB   Date Value Ref Range Status   11/16/2017 12.5 (L) 13.5 - 16.0 g/dL Final   06/03/1600 09.3 12.0 - 16.0 g/dL Final     HCT   Date Value Ref Range Status   11/16/2017 37.2 36.0 - 46.0 % Final   10/24/2014 37.7 36.0 - 46.0 % Final     Platelet   Date Value Ref Range Status   11/16/2017 160 150 - 440 10*9/L Final   10/24/2014 199 150 - 440 10*9/L Final     LDH   Date Value Ref Range Status   04/23/2015 328 (L) 338 -  610 U/L Final     Creatinine Whole Blood, POC   Date Value Ref Range Status   07/06/2016 0.4 (L) 0.7 - 1.1 mg/dL Final     Creatinine   Date Value Ref Range Status   11/16/2017 0.64 0.60 - 1.00 mg/dL Final     AST   Date Value Ref Range Status   10/19/2017 24 14 - 38 U/L Final   01/22/2015 29 14 - 38 U/L Final     CEA   Date Value Ref Range Status   12/29/2015 1.5 0.0 - 5.0 ng/mL Final   02/06/2014 <0.5 0.0 - 5.0 ng/mL Final     AFP-Tumor Marker   Date Value Ref Range Status   12/29/2015 2.01 <7.51 ng/mL Final

## 2017-11-16 ENCOUNTER — Ambulatory Visit
Admission: RE | Admit: 2017-11-16 | Discharge: 2017-11-16 | Disposition: A | Discharge status: Home / Self Care | Payer: MEDICARE | Attending: Hematology & Oncology | Admitting: Hematology & Oncology

## 2017-11-16 ENCOUNTER — Ambulatory Visit
Admission: RE | Admit: 2017-11-16 | Discharge: 2017-11-16 | Disposition: A | Discharge status: Home / Self Care | Payer: MEDICARE

## 2017-11-16 DIAGNOSIS — Z5112 Encounter for antineoplastic immunotherapy: Principal | ICD-10-CM

## 2017-11-16 DIAGNOSIS — I890 Lymphedema, not elsewhere classified: Secondary | ICD-10-CM

## 2017-11-16 DIAGNOSIS — Z170 Estrogen receptor positive status [ER+]: Secondary | ICD-10-CM

## 2017-11-16 DIAGNOSIS — C50912 Malignant neoplasm of unspecified site of left female breast: Secondary | ICD-10-CM

## 2017-11-16 DIAGNOSIS — C50112 Malignant neoplasm of central portion of left female breast: Principal | ICD-10-CM

## 2017-11-16 DIAGNOSIS — C50919 Malignant neoplasm of unspecified site of unspecified female breast: Secondary | ICD-10-CM

## 2017-11-16 DIAGNOSIS — C50012 Malignant neoplasm of nipple and areola, left female breast: Secondary | ICD-10-CM

## 2017-11-16 LAB — CBC W/ AUTO DIFF
BASOPHILS ABSOLUTE COUNT: 0 10*9/L (ref 0.0–0.1)
EOSINOPHILS ABSOLUTE COUNT: 0.1 10*9/L (ref 0.0–0.4)
HEMATOCRIT: 37.2 % (ref 36.0–46.0)
LARGE UNSTAINED CELLS: 2 % (ref 0–4)
LYMPHOCYTES ABSOLUTE COUNT: 1.2 10*9/L — ABNORMAL LOW (ref 1.5–5.0)
MEAN CORPUSCULAR HEMOGLOBIN CONC: 33.7 g/dL (ref 31.0–37.0)
MEAN CORPUSCULAR HEMOGLOBIN: 32.9 pg (ref 26.0–34.0)
MEAN CORPUSCULAR VOLUME: 97.8 fL (ref 80.0–100.0)
MEAN PLATELET VOLUME: 8.3 fL (ref 7.0–10.0)
MONOCYTES ABSOLUTE COUNT: 0.1 10*9/L — ABNORMAL LOW (ref 0.2–0.8)
NEUTROPHILS ABSOLUTE COUNT: 1.7 10*9/L — ABNORMAL LOW (ref 2.0–7.5)
PLATELET COUNT: 160 10*9/L (ref 150–440)
RED BLOOD CELL COUNT: 3.8 10*12/L — ABNORMAL LOW (ref 4.00–5.20)
RED CELL DISTRIBUTION WIDTH: 16.3 % — ABNORMAL HIGH (ref 12.0–15.0)
WBC ADJUSTED: 3.2 10*9/L — ABNORMAL LOW (ref 4.5–11.0)

## 2017-11-16 LAB — MONOCYTES ABSOLUTE COUNT: Lab: 0.1 — ABNORMAL LOW

## 2017-11-16 LAB — PHOSPHORUS: Phosphate:MCnc:Pt:Ser/Plas:Qn:: 3.4

## 2017-11-16 LAB — CREATININE
EGFR MDRD AF AMER: >=60 mL/min/{1.73_m2} (ref >=60)
EGFR MDRD NON AF AMER: >=60 mL/min/{1.73_m2} (ref >=60)

## 2017-11-16 LAB — ALBUMIN: Albumin:MCnc:Pt:Ser/Plas:Qn:: 4

## 2017-11-16 LAB — EGFR MDRD NON AF AMER: Glomerular filtration rate/1.73 sq M.predicted.non black:ArVRat:Pt:Ser/Plas/Bld:Qn:Creatinine-based formula (MDRD): >=60

## 2017-11-16 LAB — CALCIUM: Calcium:MCnc:Pt:Ser/Plas:Qn:: 9

## 2017-11-16 MED ORDER — TRAMADOL 50 MG TABLET
ORAL_TABLET | 0 refills | 0 days | Status: CP
Start: 2017-11-16 — End: 2018-01-30

## 2017-11-16 NOTE — Unmapped (Signed)
VS taken during clinic visit.    Labs completed via [facility].     Pt received Faslodex, Lupron and Xgeva injection. Faslodex given in left gluteal. Lupron given in right gluteal. Xgeva given in right upper arm - pt tolerated well.    AVS received at scheduling.    Pt stable and ambulated independently from clinic.

## 2017-11-16 NOTE — Unmapped (Signed)
Labs drawn via [place].    Port flushed and brisk blood return     Pt tolerated well.

## 2017-12-08 NOTE — Unmapped (Signed)
[facility] Refill Coordination Note  Specialty Medication(s): Ilda Foil  Additional Medications shipped: none    Diane Gonzalez, DOB: [birth year]  Phone: [phone] (home) [phone] (work), Alternate phone contact: N/A  Phone or address changes today?: No  All above HIPAA information was verified with patient.  Shipping Address: [address]  [place] [address]   Insurance changes? No    Completed refill call assessment today to schedule patient's medication shipment from the [facility] [phone]).      Confirmed the medication and dosage are correct and have not changed: Yes, regimen is correct and unchanged.    Confirmed patient started or stopped the following medications in the past month:  No, there are no changes reported at this time.    Are you tolerating your medication?:  Diane Gonzalez reports tolerating the medication.    ADHERENCE        Did you miss any doses in the past 4 weeks? No missed doses reported.    FINANCIAL/SHIPPING    Delivery Scheduled: Yes, Expected medication delivery date: 12/15/17     Diane Gonzalez did not have any additional questions at this time.    Delivery address validated in [facility] scheduling system: Yes, address listed in [facility] is correct.    We will follow up with patient monthly for standard refill processing and delivery.      Thank you,  Rollen Sox   [facility] Specialty Pharmacist

## 2017-12-13 NOTE — Unmapped (Signed)
PHONE NOTE:    I called pt to notify her that I need to cancel tomorrow's appt due to needing to be out of town to attend a family funeral.  I will also attempt to notify pt via MyChart.    Nolon Bussing. Dalanie Kisner, PhD  12/13/2017

## 2017-12-14 ENCOUNTER — Ambulatory Visit: Admit: 2017-12-14 | Discharge: 2017-12-15 | Payer: MEDICARE

## 2017-12-14 ENCOUNTER — Encounter: Admit: 2017-12-14 | Discharge: 2017-12-15 | Payer: MEDICARE | Attending: Clinical | Primary: Clinical

## 2017-12-14 DIAGNOSIS — F3341 Major depressive disorder, recurrent, in partial remission: Principal | ICD-10-CM

## 2017-12-14 DIAGNOSIS — F419 Anxiety disorder, unspecified: Secondary | ICD-10-CM

## 2017-12-14 MED ORDER — LIOTHYRONINE 25 MCG TABLET
ORAL_TABLET | Freq: Every day | ORAL | 1 refills | 0 days | Status: CP
Start: 2017-12-14 — End: 2018-01-11

## 2017-12-14 MED ORDER — PRISTIQ 100 MG TABLET,EXTENDED RELEASE
ORAL_TABLET | Freq: Every day | ORAL | 0 refills | 0.00000 days | Status: CP
Start: 2017-12-14 — End: 2018-01-11

## 2017-12-14 MED ORDER — ARIPIPRAZOLE 10 MG TABLET
ORAL_TABLET | Freq: Every day | ORAL | 1 refills | 0 days | Status: CP
Start: 2017-12-14 — End: 2018-01-11

## 2017-12-14 MED FILL — IBRANCE/125MG/CAPS: IBRANCE/125MG/CAPS | 28 days supply | Qty: 21 | Fill #5
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
## 2017-12-14 NOTE — Unmapped (Signed)
Comprehensive Cancer Support Program (CCSP) - Psychiatry Outpatient Clinic   After Visit Summary    It was a pleasure to see you today in the Comprehensive Cancer Support Program. [facility]s Comprehensive Cancer Support program (CCSP) is a multidisciplinary program dedicated to helping patients, caregivers and families with cancer treatment, recovery and survivorship.      Helpful numbers:  During business hours, for clinical concerns or scheduling issues please contact Myrene Galas, the CCSP Program Coordinator, at [phone].     For after hours urgent issues, you may call [phone] or call the I need to talk line at [phone]-TALK ([phone]) anytime 24/7.    CCSP Patient and Family Resource Center: [phone].    CCSP Website:  [email or web address]    For prescription refills, please allow at least 24 hours (during business hours, M-F) for providers to call in refills to your pharmacy. We are generally unable to accommodate same-day requests for refills.     If you are taking any controlled substances (such as anxiety or sleep medications), you must use them as the directions say to use them. We generally do not provide early refills over the phone without clear reason, and it would be inappropriate to obtain the medications from other doctors. We routinely use the [place] controlled substance database to monitor prescription drug use.

## 2017-12-18 ENCOUNTER — Encounter: Admit: 2017-12-18 | Discharge: 2017-12-31 | Payer: MEDICARE

## 2017-12-18 DIAGNOSIS — C50912 Malignant neoplasm of unspecified site of left female breast: Principal | ICD-10-CM

## 2017-12-22 ENCOUNTER — Encounter: Admit: 2017-12-22 | Discharge: 2017-12-22 | Payer: MEDICARE

## 2017-12-22 DIAGNOSIS — C50912 Malignant neoplasm of unspecified site of left female breast: Principal | ICD-10-CM

## 2017-12-22 DIAGNOSIS — Z170 Estrogen receptor positive status [ER+]: Secondary | ICD-10-CM

## 2017-12-22 DIAGNOSIS — I890 Lymphedema, not elsewhere classified: Secondary | ICD-10-CM

## 2017-12-22 DIAGNOSIS — C50012 Malignant neoplasm of nipple and areola, left female breast: Secondary | ICD-10-CM

## 2017-12-22 LAB — CALCIUM: Calcium:MCnc:Pt:Ser/Plas:Qn:: 8.8

## 2017-12-22 LAB — PHOSPHORUS: Phosphate:MCnc:Pt:Ser/Plas:Qn:: 3.5

## 2017-12-22 LAB — ALBUMIN: Albumin:MCnc:Pt:Ser/Plas:Qn:: 3.6

## 2017-12-22 LAB — EGFR MDRD NON AF AMER: Glomerular filtration rate/1.73 sq M.predicted.non black:ArVRat:Pt:Ser/Plas/Bld:Qn:Creatinine-based formula (MDRD): >=60

## 2017-12-22 LAB — CREATININE: EGFR MDRD AF AMER: >=60 mL/min/{1.73_m2} (ref >=60)

## 2017-12-22 NOTE — Unmapped (Signed)
All three shots given (zgeva sq, lupron and faslodex IM). Pt tolerated with no problems.  Derinda Sis.

## 2017-12-27 ENCOUNTER — Ambulatory Visit: Admit: 2017-12-27 | Discharge: 2017-12-28 | Payer: MEDICARE | Attending: Family | Primary: Family

## 2017-12-27 DIAGNOSIS — F329 Major depressive disorder, single episode, unspecified: Secondary | ICD-10-CM

## 2017-12-27 DIAGNOSIS — F3341 Major depressive disorder, recurrent, in partial remission: Principal | ICD-10-CM

## 2017-12-27 DIAGNOSIS — G4730 Sleep apnea, unspecified: Secondary | ICD-10-CM

## 2017-12-27 DIAGNOSIS — F419 Anxiety disorder, unspecified: Secondary | ICD-10-CM

## 2017-12-27 DIAGNOSIS — G4733 Obstructive sleep apnea (adult) (pediatric): Secondary | ICD-10-CM

## 2017-12-27 NOTE — Unmapped (Signed)
Ivor Costa Young  Jocob Dambach Valarie Cones, FNP; [facility]      ??      Yes    Previous Messages      ----- Message -----   From: Norman Herrlich, FNP   Sent: 12/27/2017 ?? 9:59 AM   To: [facility]   Subject: can i order a titration only? ?? ?? ?? ?? ?? ?? ?? ??     Hello. I am seeing this patient today. She had a diagnostic sleep study at [facility] 08/05/2017. She tried conservative therapy for treatment of her sleep apnea. Not working and still tired.     Can I just order her a CPAP titration to get her set up with CPAP?     Wynona Canes

## 2017-12-27 NOTE — Unmapped (Addendum)
Sleep Apnea: Care Instructions  Your Care Instructions    Sleep apnea means that you frequently stop breathing for 10 seconds or longer during sleep. It can be mild to severe, based on the number of times an hour that you stop breathing or have slowed breathing.  Blocked or narrowed airways in your nose, mouth, or throat can cause sleep apnea. Your airway can become blocked when your throat muscles and tongue relax during sleep.  You can treat sleep apnea at home by making lifestyle changes. You also can use a CPAP breathing machine that keeps tissues in the throat from blocking your airway. Or your doctor may suggest that you use a breathing device while you sleep. It helps keep your airway open. This could be a device that you put in your mouth. In some cases, surgery may be needed to remove enlarged tissues in the throat.  Follow-up care is a key part of your treatment and safety. Be sure to make and go to all appointments, and call your doctor if you are having problems. It's also a good idea to know your test results and keep a list of the medicines you take.  How can you care for yourself at home?  ?? Lose weight, if needed. It may reduce the number of times you stop breathing or have slowed breathing.  ?? Sleep on your side. It may stop mild apnea. If you tend to roll onto your back, sew a pocket in the back of your pajama top. Put a tennis ball into the pocket, and stitch the pocket shut. This will help keep you from sleeping on your back.  ?? Avoid alcohol and medicines such as sleeping pills and sedatives before bed.  ?? Do not smoke. Smoking can make sleep apnea worse. If you need help quitting, talk to your doctor about stop-smoking programs and medicines. These can increase your chances of quitting for good.  ?? Prop up the head of your bed 4 to 6 inches by putting bricks under the legs of the bed.  ?? Treat breathing problems, such as a stuffy nose, caused by a cold or allergies.  ?? Try a continuous positive airway pressure (CPAP) breathing machine if your doctor recommends it. The machine keeps your airway open when you sleep.  ?? If CPAP does not work for you, ask your doctor if you can try other breathing machines. A bilevel positive airway pressure machine uses one type of air pressure for breathing in and another type for breathing out. Another device raises or lowers air pressure as needed while you breathe.  ?? Talk to your doctor if:  ? Your nose feels dry or bleeds when you use one of these machines. You may need to increase moisture in the air. A humidifier may help.  ? Your nose is runny or stuffy from using a breathing machine. Decongestants or a corticosteroid nasal spray may help.  ? You are sleepy during the day and it gets in the way of the normal things you do. Do not drive when you are drowsy.  When should you call for help?  Watch closely for changes in your health, and be sure to contact your doctor if:  ?? ?? You still have sleep apnea even though you have made lifestyle changes.   ?? ?? You are thinking of trying a device such as CPAP.   ?? ?? You are having problems using a CPAP or similar machine.   Where can you learn more?  Go to [facility] at [email or web address]  Select Preferences in the upper right hand corner, then select Health Library under Resources. Enter [phone] in the search box to learn more about Sleep Apnea: Care Instructions.  Current as of: August 09, 2017  Content Version: 11.9  ?? 2006-2018 [employer]. Care instructions adapted under license by [facility]. If you have questions about a medical condition or this instruction, always ask your healthcare professional. [employer] disclaims any warranty or liability for your use of this information.

## 2017-12-27 NOTE — Unmapped (Signed)
Sleep Medicine Clinic    New Sleep Clinic Visit:    [facility] [address]  [facility] NEUROLOGY CLINIC [address] [place] [address] [facility] [place]  [address]  [place] [address]  [phone]    Date: December 27, 2017   Patient Name: Diane Gonzalez   MRN: [id number]   PCP: Malva Limes  Referring Provider: Elnora Morrison*    PROVIDER: Leonidas Romberg, FNP       Patient is seen in consultation as requested by Dr.  Kandis Mannan, Demetrius Charity* for evaluation and opinion on treatment of sleep apnea.      History of Present Illness: Patient is a 42 year old female with history of breast and cervical cancer, depression, anxiety and sleep apnea.  Patient was originally seen in the sleep clinic in July 2016 with concern of increasing fatigue and nonrestorative sleep.  Patient had several symptoms of sleep apnea based on snoring, waking up gasping for air and with headaches.  She describes excessive sleepiness and tiredness and would easily fall asleep if not busy or active.  She also describes episodes of passing out.  She was seen by neurology.  Patient did have a sleep study done on August 06, 2015 which showed mild sleep apnea with an overall AHI of 5.3, supine AHI of 10.7 and lateral positional AHI of 2.5.  Patient initiated conservative therapy such as strictly sleeping on her side.  However, her fatigue and sleepiness and nonrestorative sleep have continued and her psychiatrist would like for her to be reevaluated and consider more aggressive treatment options for her sleep apnea.  Patient returns today to discuss.  She goes to bed around 8 PM and usually goes to sleep okay.  She has minimal nighttime wake ups but does report snoring.  Her rise time is 6 AM and she is still tired.  Her Epworth scale is 6.  She is tired all day and always stays tired.  She reports occasional restless legs.  She denies nasal congestion, GERD though does take Prilosec, and denies REM behavior symptoms, sleep cataplexy and paralysis.  She denies headaches.  Patient states she is concerned about her continued level of fatigue and tiredness.  After her sleep study, she tried conservative measures such as strictly sleeping on her side.  She states she does wake up with a funny feeling in her throat, like it is closing.  She wonders if she needs a repeat sleep study but would like to pursue more aggressive treatment options such as CPAP therapy.      Medical history.  Past Medical History:   Diagnosis Date   ??? Breast cancer (CMS-HCC)    ??? Cervical cancer (CMS-HCC) 2010 or 2011    Precancerous cervical and endometrial changes.   ??? Depression    ??? Depressive disorder    ??? 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 APPENDECTOMY  06/07/2016   ??? BREAST CYST ASPIRATION Right    ??? CHEMOTHERAPY     ??? CHG US GUIDE, TISSUE ABLATION N/A 02/05/2016    Procedure: Ultrasound Guidance For, And Monitoring Of, Parenchymal Tissue Ablation;  Surgeon: Hassie Bruce, MD;  Location: MAIN OR [facility];  Service: Surgical Oncology   ??? HYSTERECTOMY  2012    Precancreous changes in cervix and uterus. Ovaries and tubes left in.   ???  MASTECTOMY Left    ??? PR BLOOD/LYMPH SYSTEM PROCEDURE Left 01/10/2014    Procedure: UNLISTED PROC HEMIC/LYMPHATIC SYST;  Surgeon: Talbert Cage, DO;  Location: ASC OR [facility];  Service: Surgical Oncology   ??? PR BX/REMV,LYMPH NODE,DEEP AXILL Left 12/31/2013    Procedure: BX/EXC LYMPH NODE; OPEN, DEEP AXILRY NODE;  Surgeon: Talbert Cage, DO;  Location: MAIN OR [facility];  Service: Surgical Oncology   ??? PR INTRAOPERATIVE SENTINEL LYMPH NODE ID W DYE INJECTION Left 12/31/2013    Procedure: INTRAOPERATIVE IDENTIFICATION SENTINEL LYMPH NODE(S) INCLUDE INJECTION NON-RADIOACTIVE DYE, WHEN PERFORMED;  Surgeon: Talbert Cage, DO;  Location: MAIN OR [facility];  Service: Surgical Oncology   ??? PR LAP,DIAGNOSTIC ABDOMEN N/A 02/05/2016    Procedure: Laparoscopy, Abdomen, Peritoneum, & Omentum, Diagnostic, W/Wo Collection Specimen(S) By Brushing Or Washing;  Surgeon: Hassie Bruce, MD;  Location: MAIN OR [facility];  Service: Surgical Oncology   ??? PR MASTECTOMY, SIMPLE, COMPLETE Left 12/31/2013    Procedure: MASTECTOMY, SIMPLE, COMPLETE;  Surgeon: Talbert Cage, DO;  Location: MAIN OR [facility];  Service: Surgical Oncology   ??? PR REMOVE ARMPITS LYMPH NODES COMPLT Left 01/10/2014    Procedure: AXILLARY LYMPHADENECTOMY; COMPLETE;  Surgeon: Talbert Cage, DO;  Location: ASC OR [facility];  Service: Surgical Oncology   ??? PR RESEC LIVER,PART LOBECTOMY N/A 02/05/2016    Procedure: Hepatectomy, Resection Of Liver; Partial Lobectomy;  Surgeon: Hassie Bruce, MD;  Location: MAIN [facility];  Service: Surgical Oncology       Social Hx:  Social History     Social History   ??? Marital status: Legally Separated     Spouse name: N/A   ??? Number of children: 2   ??? Years of education: N/A     Occupational History   ??? unemployed      Social History Main Topics   ??? Smoking status: Former Smoker     Packs/day: 0.25     Years: 7.00     Quit date: 12/05/2009   ??? Smokeless tobacco: Never Used   ??? Alcohol use No   ??? Drug use: No   ??? Sexual activity: Not Asked     Other Topics Concern   ??? None     Social History Narrative    Currently separated with 47 year old disabled son and 19 year old daughter. Some support from aunts who live nearby but otherwise not a lot of help. Not working a year before diagnosis and trying for disability.              Job: Does not work.  Has son and daughter.  [place]  Caffeine: One cup caffeine daily  ETOH: None    Family Hx:  Family History   Problem Relation Age of Onset   ??? Diabetes Mother    ??? Cancer Father 9        basal cell cancer   ??? Depression Father    ??? Diabetes Brother    ??? Autism Brother    ??? Stroke Maternal Grandmother 48   ??? Thyroid disease Maternal Grandmother    ??? Cancer Maternal Grandfather 38 lung cancer; smoker   ??? Cancer Paternal Grandmother         skin cancer   ??? Mental retardation Brother    ??? Cleft palate Brother    ??? Thyroid disease Maternal Aunt        ALLERGIES:  Allergies   Allergen Reactions   ??? Adhesive Hives  Surgical glue   ??? Gabapentin Other (See Comments)     weakness in legs, extremely tired, when coming off med., became irritable and very itchy   ??? Bupropion Hcl      lightheadness   ??? Phenergan [Promethazine] Other (See Comments)     Tremors   ??? Cephalexin Itching   ??? Effexor [Venlafaxine] Other (See Comments)     Patient states she is only able to take Desvenlafaxine-Effexor doesn't work.   ??? Latex, Natural Rubber Rash   ??? Penicillins Rash   ??? Percocet [Oxycodone-Acetaminophen] Rash and Other (See Comments)     Dizzy;mentally not right; did not ease pain       CURRENT MEDICATIONS:    Current Outpatient Prescriptions:   ???  ARIPiprazole (ABILIFY) 10 MG tablet, Take 0.5 tablets (5 mg total) by mouth daily., Disp: 30 tablet, Rfl: 1  ???  calcium-magnesium-zinc Tab, Take 2 tablets by mouth daily. , Disp: , Rfl:   ???  cyanocobalamin (VITAMIN B-12) 1000 MCG tablet, Take 1,000 mcg by mouth daily. , Disp: , Rfl:   ???  denosumab (XGEVA) 120 mg/1.7 mL (70 mg/mL) Soln, Inject under the skin., Disp: , Rfl:   ???  docusate sodium (COLACE) 100 MG capsule, Take 100 mg by mouth Three (3) times a day as needed for constipation., Disp: , Rfl:   ???  fulvestrant (FASLODEX) 250 mg/5 mL Syrg, Inject 250 mg into the muscle., Disp: , Rfl:   ???  IBRANCE 125 mg capsule, , Disp: , Rfl:   ???  ketoconazole (NIZORAL) 2 % cream, Apply 1 application topically daily., Disp: 30 g, Rfl: 1  ???  leuprolide (LUPRON) 11.25 mg injection, Inject 11.25 mg into the muscle Every three (3) months., Disp: , Rfl:   ???  liothyronine (CYTOMEL) 25 MCG tablet, Take 1 tablet (25 mcg total) by mouth daily., Disp: 30 tablet, Rfl: 1  ???  MEDICAL SUPPLY ITEM, Compression sleeve with glove, Disp: 1 each, Rfl: 0  ???  melatonin 3 mg Tab, Take 1 tablet (3 mg total) by mouth nightly., Disp: 30 tablet, Rfl: 0  ???  omeprazole (PRILOSEC) 20 MG capsule, Take 1 capsule (20 mg total) by mouth daily., Disp: 30 capsule, Rfl: 3  ???  ondansetron (ZOFRAN, AS HYDROCHLORIDE,) 4 MG tablet, Take 4 mg by mouth every eight (8) hours as needed. , Disp: , Rfl:   ???  PRISTIQ 100 mg 24 hr tablet, Take 1 tablet (100 mg total) by mouth daily. Take 1 Tablet (100 MG Total) by Mouth Daily, Disp: 90 tablet, Rfl: 0  ???  senna (SENOKOT) 8.6 mg tablet, Take 1 tablet by mouth daily., Disp: , Rfl:   ???  traMADol (ULTRAM) 50 mg tablet, 1 or 2 tablets every 4 to 6 hours as needed for pain, Disp: 60 tablet, Rfl: 0  ???  omeprazole (PRILOSEC) 20 MG capsule, TAKE 1 CAPSULE (20 MG TOTAL) BY MOUTH DAILY. (Patient not taking: Reported on 12/27/2017), Disp: 30 capsule, Rfl: 3    Review of Systems:  A 14-systems review was performed and, unless otherwise noted, declared negative except reported in history of present illness    PHYSICAL EXAM: Patient is in no acute distress, BP 130/87 (BP Site: R Arm, BP Position: Sitting, BP Cuff Size: Medium)  - Pulse 101  - Ht 157.5 cm (5' 2)  - Wt (!) 101.2 kg (223 lb)  - BMI 40.79 kg/m?? , eyes clear, noninjected, nose without discharge, Neck: Supple, heart regular rate regular rhythm, abdomen  obese, lower extremities without edema    NEURO EXAM: Patient is alert, oriented ??3, speech and memory appears normal, sensation intact to light touch, no tremors, facial symmetry normal, gait non-ataxic and normal stride,    LABS and DATA reviewed: Referral notes reviewed, previous clinic notes reviewed, [facility] sleep study reviewed from August 06, 2015    ASSESSMENT and IMPRESSION: Patient is a 42 year old female with history of anxiety, depression, breast and cervical cancer, fatigue and known sleep apnea.  Patient has been initiating conservative treatment for her sleep apnea, such as strictly sleeping on her side but her fatigue and tiredness continue.    Plan:     1.  Anxiety and depression.  Continue medications and follow-up with [facility] psychiatry.    2.  Sleep apnea.  Discussed sleep apnea and risk factors for untreated sleep apnea and the benefit of more aggressive treatment options such as CPAP therapy, oral dental appliance, or in selected cases, throat surgery.  Patient would like to pursue a CPAP titration and do a trial of CPAP therapy to see if her fatigue and sleep issues continue.  Order placed for a CPAP titration and she will follow-up for results.  Should patient need a new diagnostic sleep study for insurance purposes, order will be changed to diagnostic, split-night study.    40 minutes face to face time spent with patient of which greater than half of the time was spent with counseling and education that reviewed the diagnosis, assessment and treatment options for sleep apnea    *Patient note was created using dragon dictation. Any errors in syntax or proofreading may not have been identified and edited on initial review prior to signing this note.

## 2018-01-04 NOTE — Unmapped (Signed)
[facility] Refill and Clinical Coordination Note  Medication(s): Ibrance 125mg     Diane Gonzalez, DOB: 1976-10-08  Phone: [phone] (home) [phone] (work), Alternate phone contact: N/A  Shipping address: [address]  [place] [address]  Phone or address changes today?: No  All above HIPAA information verified.  Insurance changes? No    Completed refill and clinical call assessment today to schedule patient's medication shipment from the [facility] [phone]).      MEDICATION RECONCILIATION    Confirmed the medication and dosage are correct and have not changed: Yes, regimen is correct and unchanged.    Were there any changes to your medication(s) in the past month:  No, there are no changes reported at this time.    ADHERENCE    Is this medicine transplant or covered by Medicare Part B? No.    Did you miss any doses in the past 4 weeks? No missed doses reported.  Adherence counseling provided? Not needed     SIDE EFFECT MANAGEMENT    Are you tolerating your medication?:  Diane Gonzalez reports tolerating the medication.  Side effect management discussed: None      Therapy is appropriate and should be continued.    Evidence of clinical benefit: See Epic note from 11/16/17      FINANCIAL/SHIPPING    Delivery Scheduled: Yes, Expected medication delivery date: 01/12/18   Additional medications refilled: No additional medications/refills needed at this time.    Diane Gonzalez did not have any additional questions at this time.    Delivery address validated in [facility] scheduling system: Yes, address listed above is correct.      We will follow up with patient monthly for standard refill processing and delivery.      Thank you,  Lupita Shutter   [facility] Specialty Pharmacist

## 2018-01-10 MED FILL — IBRANCE/125MG/CAPS: IBRANCE/125MG/CAPS | 28 days supply | Qty: 21 | Fill #6

## 2018-01-11 ENCOUNTER — Ambulatory Visit: Admit: 2018-01-11 | Discharge: 2018-01-12 | Payer: MEDICARE

## 2018-01-11 DIAGNOSIS — Z79899 Other long term (current) drug therapy: Secondary | ICD-10-CM

## 2018-01-11 DIAGNOSIS — F419 Anxiety disorder, unspecified: Secondary | ICD-10-CM

## 2018-01-11 DIAGNOSIS — F3341 Major depressive disorder, recurrent, in partial remission: Principal | ICD-10-CM

## 2018-01-11 MED ORDER — PRISTIQ 100 MG TABLET,EXTENDED RELEASE
ORAL_TABLET | Freq: Every day | ORAL | 0 refills | 0 days | Status: CP
Start: 2018-01-11 — End: 2018-04-24

## 2018-01-11 MED ORDER — LIOTHYRONINE 25 MCG TABLET
ORAL_TABLET | Freq: Every day | ORAL | 1 refills | 0.00000 days | Status: CP
Start: 2018-01-11 — End: 2018-03-16

## 2018-01-11 MED ORDER — ARIPIPRAZOLE 5 MG TABLET
ORAL_TABLET | Freq: Every day | ORAL | 1 refills | 0.00000 days | Status: CP
Start: 2018-01-11 — End: 2018-03-05
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
## 2018-01-11 NOTE — Unmapped (Signed)
Comprehensive Cancer Support Program (CCSP) - Psychiatry Outpatient Clinic   After Visit Summary    It was a pleasure to see you today in the Comprehensive Cancer Support Program. [facility]s Comprehensive Cancer Support program (CCSP) is a multidisciplinary program dedicated to helping patients, caregivers and families with cancer treatment, recovery and survivorship.      Helpful numbers:  During business hours, for clinical concerns or scheduling issues please contact Myrene Galas, the CCSP Program Coordinator, at [phone].     For after hours urgent issues, you may call [phone] or call the I need to talk line at [phone]-TALK ([phone]) anytime 24/7.    CCSP Patient and Family Resource Center: [phone].    CCSP Website:  [email or web address]    For prescription refills, please allow at least 24 hours (during business hours, M-F) for providers to call in refills to your pharmacy. We are generally unable to accommodate same-day requests for refills.     If you are taking any controlled substances (such as anxiety or sleep medications), you must use them as the directions say to use them. We generally do not provide early refills over the phone without clear reason, and it would be inappropriate to obtain the medications from other doctors. We routinely use the [place] controlled substance database to monitor prescription drug use.

## 2018-01-21 NOTE — Unmapped (Deleted)
Followup Visit Note    Patient Name: Diane Gonzalez  Patient Age: 42 y.o.  Encounter Date: 01/24/2018    Referring Physician:   Towanda Malkin, MD  [address] [place]  [address] [id number]  [place], [place] [address]    Assessment/Plan:    Reason for Visit  Breast Cancer    Cancer Staging  Breast cancer, left breast (CMS-HCC)  Staging form: Breast, AJCC 7th Edition  - Clinical: ER 98%, PR 92%, HER2 0, not amplified; Gr 2 (Nott 6); BRCA 1/2 w.t. - Unsigned  - Pathologic: Stage IIB (T2, N68mi, cM0) - Signed by Diane Cage, DO on 01/28/2014        1.  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        -- 07/14/17: PET CT: progression in T1/T5;          -- 08/02/17: Ibrance 125 mg daily     2.  Status post hysterectomy, ovaries/ tubes intact       3.  History of viral encephalitis with coma, with mild residual neurologic symptoms     4.  Lymphedema     5.  Syncope, probable orthostatic; w/u underway with Diane Gonzalez.        -- MRI of CNS without obvious lesion, final radiology report pending  01/19/16        --  EEG with out evidence of seizures.        -- Cardiology evaluation suggests not cardiac in etiology; referred to neurology        -- OSA 06/15/16; recommended positional therapy     6.  Depression; followed by Diane Gonzalez;  on Pristiq and Zyprexa; off of gabapentin     7.  Headaches; resolved off of Effexor     8.  H/O meningitis, with brain biopsy       9.  Osteopenia  DEXA   01/19/16     10. [employer]      11. OSA, w/ RLS; recommended positional sleeping, or CPAP    12. S/p Appendectomy, 05/16/17 [facility])    13. Lumbosacral mediated pain, lumbar DDD, possible sciatica          -- 07/17/17: Caudal epidural steroid injection with good pain relief.           Plan:   -- Patient is tolerating Ibrance at 125 mg daily (with fulvestrant, Xgeva and Lupron), with  only mild increased fatigue.  -  --PET/CT today report suggest progression of bony lesions, but oddly without CT correlate, and without FDG avidity visible on the images available for review in PACS.  In addition, site that was previously strongly positive, is no longer positive and an area with biopsy suggesting a non-pathologic fracture (and seventh rib) is described as being FDG avid.  These issues were discussed.  ALP is pending.  Assuming this is not risen significantly, our plan will be to continue on current treatment regimen and repeat a bone scan in 1 month to further document disease extent.  If there is progression, would consider change in hormonal therapy, and possibly switching from palbociclib to Afinitor.  In addition, the patient will call if her bone pain worsens.      I have reviewed the laboratory, pathology, and radiology reports in detail and discussed findings with the patient.  The patient had a restaging MRI of the abdomen today. Preliminarily, no new lesions noted in the liver.    Interval History:  The patient returns for follow up. She is fatigued, and has had a mild increase in poorly localized back pain.  She is taken some tramadol in the past week.  She denies fevers or shaking chills.She denies nausea, rash, fevers, chills, other malaise, headaches, visual changes, seizures, dyspnea, cough, anorexia, bowel or bladder dysfunction, swollen glands and new pain.  She has no upper back or chest wall pain.           Breast cancer, left breast (CMS-HCC)    11/22/2013 Initial Diagnosis     Breast cancer, left breast; 4.8 cm left breast mass; suspicious nodes.         11/25/2013 Biopsy     IDC, Gr 2; ER +, PR +, HER2 0/not amplified         12/11/2013 -  Cancer Staged     BRCA 1/2 without mutation         12/31/2013 Surgery     left simple mastectomy and SLND: 4.5 cm primary; DCIS 1 of 5 positive node with 1.3 mm deposit.         01/10/2014 Surgery     ALND: 0 of 8 nodes imvolved.         02/13/2014 - 04/17/2014 Chemotherapy     Taxotere/Cyclophosphamide x 4 cycles         05/07/2014 - 06/27/2014 Radiation     33 fractions.         07/18/2014 - 01/24/2015 Chemotherapy     Tamoxifen. Stopped secondary to side effects.         02/01/2015 -  Chemotherapy     Leuprolide and letrozole         03/02/2015 -  Chemotherapy     Anastrozole in place of letrozole, secondary to nausea; not tolerated. Prescription for exemestane written, but not filled.         11/09/2015 -  Chemotherapy     Exemestane started by the patient         11/09/2015 -  Cancer Staged     Chest CT:  Irregular, mottled, and mildly expansile appearance of left seventh posterior rib may represent osseous metastasis. No lytic or blastic lesions are noted in thoracic spine. Mild degenerative changes of thoracic spine.          11/24/2015 -  Cancer Staged     PET/CT: Nodule in the left liver lobe, corresponds to ring-enhancing lesion present on 11/09/15 chest  CT; left seventh rib equivocal for metastatic disease versus fracture. No other significant areas of uptake.         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 -- Two tiny T2 hyperintense lesions in the segment VII and segment VIA ;probable cysts.         02/05/2016 Surgery     Invasive adenocarcinoma, consistent with a metastasis from a breast (ductal) primary (2.5 cm) - Parenchymal resection margin is widely free (2.5 cm to carcinoma) ?????? - Estrogen receptor: Positive (95%, 3+; PR 3+; HER2 0         03/28/2016 -  Chemotherapy     Fulvestrant 500 mg         07/06/2016 -  Cancer Staged     CT [id number] sclerotic lesion concerning for metastases in a patient with history of breast cancer. Correlation with nuclear medicine bone scintigraphy is recommended.  -Nodular enhancement in the midline incision as well as in the underlying mesentery is indeterminant and may represent postsurgical change, less likely metastatic disease. Attention on follow-up.         07/15/2016 -  Cancer Staged     MRI L-spine without contrast: Mild degenerative changes in lumbar spine without significant spinal canal or neural foraminal narrowing. No evidence of malignancy.         07/19/2016 -  Cancer Staged     NM Bone Scan:  Persistent uptake in the left posterior seventh rib, likely healing fracture.  - Sclerotic sacral lesion without increased radiotracer uptake.         01/26/2017 -  Cancer Staged     PET-CT:  - Increased uptake at the hepatic resection site, most likely postsurgical change. Recommend clinical correlation. A PET/CT in 3-6 months may be helpful to more fully characterize  - Probable healing fracture in the left seventh rib and treated metastasis in the sacrum. Recommend attention on future scans.          07/06/2017 -  Cancer Staged       MRI L-Spine: Sclerotic lesion at the anterior aspect of S2 with thin rim of peripheral enhancement, suggestive of sclerotic metastasis, unchanged in size from prior study. Additional ill-defined focus of enhancement in the right sacrum adjacent to the S2 neural foramen may also represent a small site of metastasis.  - L5-S1 central disc protrusion with mild central canal narrowing and neural foraminal narrowing.  - Findings suggestive of mild osteitis pubis.  - Left gluteus medius minimus tendinosis/partial tear. Mild right gluteus minimus tendinosis.         07/14/2017 -  Cancer Staged       PET-CT:    Overall progression of disease with new avid FDG uptake in the T1 vertebral body and T5 right pedicle concerning for metastatic disease. Along with interval increase in the FDG uptake in the more focal hepatic lesion when compared to prior imaging.    - Stable lesions in the left seventh rib and sacrum.                 07/27/2017 -  Chemotherapy  Palbociclib  125 mg for 21 of 28 days.         11/16/2017 -  Cancer Staged       PET/CT:  Overall progression of disease with new osseous metastases in the T8, T11 vertebral body and the right sacrum along with progression of previously identified osseous metastases, including T7.  Note that these areas of uptake are not seen in PACS (visible only on radiology department software), did not have CT scan correlate.    - Interval resolution of the previously noted hepatic lesion adjacent the suture line however there is a new ill-defined hypermetabolic hepatic focus without evidence CT correlation. Recommend attention on follow-up imaging.            The following portions of the patient's history were reviewed and updated as appropriate: current medications and problem list.    Review of Systems   All other systems reviewed and are negative.      Vital signs for this encounter:  BSA: There is no height or weight on file to calculate BSA.  There were no vitals taken for this visit.     Repeat BP with the manual cuff:  142/98    Physical Exam  Constitutional: She is oriented to person, place, and time. She appears well-developed and well-nourished. No distress.   HENT:   Head: Normocephalic.   Mouth/Throat: Oropharynx is clear and moist. Scant clear drainage in the pharynx  Eyes: EOM are normal. Pupils are equal, round, and reactive to light.   Neck: No JVD present. No thyromegaly present.   Cardiovascular: Normal rate and normal heart sounds. ??  Pulmonary/Chest: Effort normal and breath sounds normal.   Breast: 10/19/17:??Left chest wall is without nodules or tenderness. The right breast is without suspicious nodules or discharge.   Lymph: No adenopathy in the neck, supraclavicular, axillary or inguinal regions.??  Abdominal: Soft. She exhibits no distension and no mass. There is no tenderness.   Musculoskeletal: She exhibits no edema. No tenderness to percussion over spine, chest wall,  pelvis or hips. No tenderness to palpation over the sacrum, or SI joint regions bilaterally.??   Neurological: She is alert and oriented to person, place, and time. No cranial nerve deficit. She exhibits normal muscle tone. Coordination normal.   Skin: Mild candidal skin infection in the groin.  Healing follicular abscess on the right breast  Psychiatric: She has a normal mood and affect. Her behavior is normal.         Karnofsky/Lansky Performance Status  80, Normal activity with effort; some signs or symptoms of disease (ECOG equivalent 1)     Results:    WBC   Date Value Ref Range Status   11/16/2017 3.2 (L) 4.5 - 11.0 10*9/L Final   10/24/2014 5.8 4.5 - 11.0 10*9/L Final     HGB   Date Value Ref Range Status   11/16/2017 12.5 (L) 13.5 - 16.0 g/dL Final   16/09/9603 54.0 12.0 - 16.0 g/dL Final     HCT   Date Value Ref Range Status   11/16/2017 37.2 36.0 - 46.0 % Final   10/24/2014 37.7 36.0 - 46.0 % Final     Platelet   Date Value Ref Range Status   11/16/2017 160 150 - 440 10*9/L Final   10/24/2014 199 150 - 440 10*9/L Final     LDH   Date Value Ref Range Status   04/23/2015 328 (L) 338 - 610 U/L Final     Creatinine Whole  Blood, POC   Date Value Ref Range Status   07/06/2016 0.4 (L) 0.7 - 1.1 mg/dL Final     Creatinine   Date Value Ref Range Status   12/22/2017 0.76 0.60 - 1.00 mg/dL Final     AST   Date Value Ref Range Status   10/19/2017 24 14 - 38 U/L Final   01/22/2015 29 14 - 38 U/L Final     CEA   Date Value Ref Range Status   12/29/2015 1.5 0.0 - 5.0 ng/mL Final   02/06/2014 <0.5 0.0 - 5.0 ng/mL Final     AFP-Tumor Marker   Date Value Ref Range Status   12/29/2015 2.01 <7.51 ng/mL Final

## 2018-01-26 ENCOUNTER — Encounter: Admit: 2018-01-26 | Discharge: 2018-01-28 | Payer: MEDICARE

## 2018-01-26 DIAGNOSIS — G4733 Obstructive sleep apnea (adult) (pediatric): Principal | ICD-10-CM

## 2018-01-29 NOTE — Unmapped (Signed)
Followup Visit Note    Patient Name: Diane Gonzalez  Patient Age: 42 y.o.  Encounter Date: 01/30/2018    Referring Physician:   Towanda Malkin, MD  [address]  [phone]  [place], [place] [address]    Assessment/Plan:    Reason for Visit  Breast Cancer    Cancer Staging  Breast cancer, left breast (CMS-HCC)  Staging form: Breast, AJCC 7th Edition  - Clinical: ER 98%, PR 92%, HER2 0, not amplified; Gr 2 (Nott 6); BRCA 1/2 w.t. - Unsigned  - Pathologic: Stage IIB (T2, N46mi, cM0) - Signed by Talbert Cage, DO on 01/28/2014        1.  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        -- 07/14/17: PET CT: progression in T1/T5;          -- 08/02/17: Ibrance 125 mg daily     2.  Status post hysterectomy, ovaries/ tubes intact       3.  History of viral encephalitis with coma, with mild residual neurologic symptoms     4.  Lymphedema     5.  Syncope, probable orthostatic; w/u underway with Dr Julio Alm.        -- MRI of CNS without obvious lesion, final radiology report pending  01/19/16        --  EEG with out evidence of seizures.        -- Cardiology evaluation suggests not cardiac in etiology; referred to neurology        -- OSA 06/15/16; recommended positional therapy     6.  Depression; followed by [name];  on Pristiq and Zyprexa; off of gabapentin     7.  Headaches; resolved off of Effexor     8.  H/O meningitis, with brain biopsy       9.  Osteopenia  DEXA   01/19/16     10. Jehov[employer]'s Witness      11. OSA, w/ RLS; recommended positional sleeping, or CPAP      -- 01/27/18: CPAP at 45mmH2) recommended    12. S/p Appendectomy, 05/16/17 [facility])    13. Lumbosacral mediated pain, lumbar DDD, possible sciatica          -- 07/17/17: Caudal epidural steroid injection with good pain relief.           Plan:   -- Patient is tolerating  Ibrance at 125 mg daily (with fulvestrant, Xgeva and Lupron), with only mild increased fatigue.  -  --Ranitidine 300 mg will be added at bedtime to Prilosec the morning to see if this helps with her symptoms of reflux, and nausea.  --We will repeat imaging studies when she returns in 1 month, assuming that the remainder of today's lab studies are acceptable.    Patient instructions:    If the remainder of today's lab studies are acceptable, we will plan to see you back March 26 with follow-up PET/CT.  --Take omeprazole (Prilosec) in the morning, and ranitidine 300 mg at bedtime.  --Do not hesitate to contact me if your symptoms are not well controlled on your current medications, prior to your follow-up visit.        I have reviewed the laboratory, pathology, and radiology reports in detail and discussed findings with the patient.  The patient had a restaging MRI of the abdomen today. Preliminarily, no new lesions noted in the liver.    Interval History:  The patient returns for follow up. She is nauseous in the mornings, and sometimes all day.  She had an illness about 3 weeks ago that involves coughing, nausea and vomiting.  Since then, she has had pain in the left ribs, at the site of the previous rib fracture. The fatigue is about the same.  Her sciatica continues to do well since the epidural injection last year.  Her psychiatrist put her on T3, which is given her some energy, but she was concerned that it might be contributing to her nausea.  She is held the medicine for the last 3 days, the nausea has not improved.  She denies rash, fevers, chills, other malaise, headaches, visual changes, seizures, dyspnea, cough, anorexia, bowel or bladder dysfunction, swollen glands and new pain.  He had a sleep study last week and is awaiting the results; reports suggest that the patient requires CPAP (and gives directions as to millimeters of water, mask type, and the need for humidifier); she has follow-up scheduled with neurology.           Breast cancer, left breast (CMS-HCC)    11/22/2013 Initial Diagnosis     Breast cancer, left breast; 4.8 cm left breast mass; suspicious nodes.         11/25/2013 Biopsy     IDC, Gr 2; ER +, PR +, HER2 0/not amplified         12/11/2013 -  Cancer Staged     BRCA 1/2 without mutation         12/31/2013 Surgery     left simple mastectomy and SLND: 4.5 cm primary; DCIS 1 of 5 positive node with 1.3 mm deposit.         01/10/2014 Surgery     ALND: 0 of 8 nodes imvolved.         02/13/2014 - 04/17/2014 Chemotherapy     Taxotere/Cyclophosphamide x 4 cycles         05/07/2014 - 06/27/2014 Radiation     33 fractions.         07/18/2014 - 01/24/2015 Chemotherapy     Tamoxifen. Stopped secondary to side effects.         02/01/2015 -  Chemotherapy     Leuprolide and letrozole         03/02/2015 -  Chemotherapy     Anastrozole in place of letrozole, secondary to nausea; not tolerated. Prescription for exemestane written, but not filled.  11/09/2015 -  Chemotherapy     Exemestane started by the patient         11/09/2015 -  Cancer Staged     Chest CT:  Irregular, mottled, and mildly expansile appearance of left seventh posterior rib may represent osseous metastasis. No lytic or blastic lesions are noted in thoracic spine. Mild degenerative changes of thoracic spine.          11/24/2015 -  Cancer Staged     PET/CT: Nodule in the left liver lobe, corresponds to ring-enhancing lesion present on 11/09/15 chest CT; left seventh rib equivocal for metastatic disease versus fracture. No other significant areas of uptake.         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 -- Two tiny T2 hyperintense lesions in the segment VII and segment VIA ;probable cysts.         02/05/2016 Surgery     Invasive adenocarcinoma, consistent with a metastasis from a breast (ductal) primary (2.5 cm) - Parenchymal resection margin is widely free (2.5 cm to carcinoma) ?????? - Estrogen receptor: Positive (95%, 3+; PR 3+; HER2 0         03/28/2016 -  Chemotherapy     Fulvestrant 500 mg         07/06/2016 -  Cancer Staged     CT [id number] sclerotic lesion concerning for metastases in a patient with history of breast cancer. Correlation with nuclear medicine bone scintigraphy is recommended.  -Nodular enhancement in the midline incision as well as in the underlying mesentery is indeterminant and may represent postsurgical change, less likely metastatic disease. Attention on follow-up.         07/15/2016 -  Cancer Staged     MRI L-spine without contrast: Mild degenerative changes in lumbar spine without significant spinal canal or neural foraminal narrowing. No evidence of malignancy.         07/19/2016 -  Cancer Staged     NM Bone Scan:  Persistent uptake in the left posterior seventh rib, likely healing fracture.  - Sclerotic sacral lesion without increased radiotracer uptake.         01/26/2017 -  Cancer Staged     PET-CT:  - Increased uptake at the hepatic resection site, most likely postsurgical change. Recommend clinical correlation. A PET/CT in 3-6 months may be helpful to more fully characterize  - Probable healing fracture in the left seventh rib and treated metastasis in the sacrum. Recommend attention on future scans.          07/06/2017 -  Cancer Staged       MRI L-Spine: Sclerotic lesion at the anterior aspect of S2 with thin rim of peripheral enhancement, suggestive of sclerotic metastasis, unchanged in size from prior study. Additional ill-defined focus of enhancement in the right sacrum adjacent to the S2 neural foramen may also represent a small site of metastasis.  - L5-S1 central disc protrusion with mild central canal narrowing and neural foraminal narrowing.  - Findings suggestive of mild osteitis pubis.  - Left gluteus medius minimus tendinosis/partial tear. Mild right gluteus minimus tendinosis.  07/14/2017 -  Cancer Staged       PET-CT:    Overall progression of disease with new avid FDG uptake in the T1 vertebral body and T5 right pedicle concerning for metastatic disease. Along with interval increase in the FDG uptake in the more focal hepatic lesion when compared to prior imaging.    - Stable lesions in the left seventh rib and sacrum.                 07/27/2017 -  Chemotherapy     Palbociclib  125 mg for 21 of 28 days.         11/16/2017 -  Cancer Staged       PET/CT:  Overall progression of disease with new osseous metastases in the T8, T11 vertebral body and the right sacrum along with progression of previously identified osseous metastases, including T7.  Note that these areas of uptake are not seen in PACS (visible only on radiology department software), did not have CT scan correlate.    - Interval resolution of the previously noted hepatic lesion adjacent the suture line however there is a new ill-defined hypermetabolic hepatic focus without evidence CT correlation. Recommend attention on follow-up imaging.         12/18/2017 -  Cancer Staged Bone scan:  Decrease in uptake in the left posterior seventh rib is prior.  -Focal increased uptake at the T10 and L2 vertebral body as above.              The following portions of the patient's history were reviewed and updated as appropriate: current medications and problem list.    Review of Systems   All other systems reviewed and are negative.      Vital signs for this encounter:  BSA: There is no height or weight on file to calculate BSA.  BP 143/73  - Pulse 92  - Temp 36.4 ??C (97.5 ??F) (Oral)  - Resp 20  - SpO2 97%      Repeat BP with the manual cuff:  142/98    Physical Exam  Constitutional: She is oriented to person, place, and time. She appears well-developed and well-nourished. No distress.   HENT:   Head: Normocephalic.   Mouth/Throat: Oropharynx is clear and moist. Scant clear drainage in the pharynx  Eyes: EOM are normal. Pupils are equal, round, and reactive to light.   Neck: No JVD present. No thyromegaly present.   Cardiovascular: Normal rate and normal heart sounds. ??  Pulmonary/Chest: Effort normal and breath sounds normal.   Breast: 01/30/18:??Left chest wall is without nodules or tenderness; there are some areas of scarring that are slightly tender, but reduce with gentle pressure and become less tender.  The most prominent site measures about 0.5-1 cm, and is lateral to the midclavicular line, just above the scar. The right breast is without suspicious nodules or discharge.   Lymph: No adenopathy in the neck, supraclavicular, axillary or inguinal regions.??  Abdominal: Soft. She exhibits no distension and no mass. There is no tenderness.   Musculoskeletal: She exhibits no edema. No tenderness to percussion over spine, chest wall,  pelvis or hips. No tenderness to palpation over the sacrum, or SI joint regions bilaterally.??   Neurological: She is alert and oriented to person, place, and time. No cranial nerve deficit. She exhibits normal muscle tone. Coordination normal.   Skin: Mild candidal skin infection in the groin.  Healing follicular abscess on the right breast  Psychiatric: She has  a normal mood and affect. Her behavior is normal.         Karnofsky/Lansky Performance Status  80, Normal activity with effort; some signs or symptoms of disease (ECOG equivalent 1)     Results:    WBC   Date Value Ref Range Status   01/30/2018 3.3 (L) 4.5 - 11.0 10*9/L Final   10/24/2014 5.8 4.5 - 11.0 10*9/L Final     HGB   Date Value Ref Range Status   01/30/2018 13.1 (L) 13.5 - 16.0 g/dL Final   09/81/1914 78.2 12.0 - 16.0 g/dL Final     HCT   Date Value Ref Range Status   01/30/2018 40.5 36.0 - 46.0 % Final   10/24/2014 37.7 36.0 - 46.0 % Final     Platelet   Date Value Ref Range Status   01/30/2018 184 150 - 440 10*9/L Final   10/24/2014 199 150 - 440 10*9/L Final     LDH   Date Value Ref Range Status   04/23/2015 328 (L) 338 - 610 U/L Final     Creatinine Whole Blood, POC   Date Value Ref Range Status   07/06/2016 0.4 (L) 0.7 - 1.1 mg/dL Final     Creatinine   Date Value Ref Range Status   12/22/2017 0.76 0.60 - 1.00 mg/dL Final     AST   Date Value Ref Range Status   10/19/2017 24 14 - 38 U/L Final   01/22/2015 29 14 - 38 U/L Final     CEA   Date Value Ref Range Status   12/29/2015 1.5 0.0 - 5.0 ng/mL Final   02/06/2014 <0.5 0.0 - 5.0 ng/mL Final     AFP-Tumor Marker   Date Value Ref Range Status   12/29/2015 2.01 <7.51 ng/mL Final

## 2018-01-30 ENCOUNTER — Encounter: Payer: Self-pay | Admitting: Family Medicine

## 2018-01-30 ENCOUNTER — Encounter
Admit: 2018-01-30 | Discharge: 2018-01-30 | Payer: MEDICARE | Attending: Hematology & Oncology | Primary: Hematology & Oncology

## 2018-01-30 ENCOUNTER — Encounter: Admit: 2018-01-30 | Discharge: 2018-01-30 | Payer: MEDICARE

## 2018-01-30 DIAGNOSIS — G4733 Obstructive sleep apnea (adult) (pediatric): Secondary | ICD-10-CM | POA: Insufficient documentation

## 2018-01-30 DIAGNOSIS — C50912 Malignant neoplasm of unspecified site of left female breast: Secondary | ICD-10-CM

## 2018-01-30 DIAGNOSIS — I890 Lymphedema, not elsewhere classified: Secondary | ICD-10-CM

## 2018-01-30 DIAGNOSIS — C50919 Malignant neoplasm of unspecified site of unspecified female breast: Secondary | ICD-10-CM

## 2018-01-30 DIAGNOSIS — K219 Gastro-esophageal reflux disease without esophagitis: Secondary | ICD-10-CM

## 2018-01-30 DIAGNOSIS — C50012 Malignant neoplasm of nipple and areola, left female breast: Secondary | ICD-10-CM

## 2018-01-30 DIAGNOSIS — Z170 Estrogen receptor positive status [ER+]: Secondary | ICD-10-CM

## 2018-01-30 DIAGNOSIS — R110 Nausea: Secondary | ICD-10-CM

## 2018-01-30 DIAGNOSIS — C50512 Malignant neoplasm of lower-outer quadrant of left female breast: Secondary | ICD-10-CM

## 2018-01-30 DIAGNOSIS — G893 Neoplasm related pain (acute) (chronic): Secondary | ICD-10-CM

## 2018-01-30 LAB — CBC W/ AUTO DIFF
BASOPHILS ABSOLUTE COUNT: 0 10*9/L (ref 0.0–0.1)
BASOPHILS RELATIVE PERCENT: 1.1 %
EOSINOPHILS ABSOLUTE COUNT: 0.1 10*9/L (ref 0.0–0.4)
EOSINOPHILS RELATIVE PERCENT: 2.7 %
HEMATOCRIT: 40.5 % (ref 36.0–46.0)
LARGE UNSTAINED CELLS: 1 % (ref 0–4)
LYMPHOCYTES RELATIVE PERCENT: 28.5 %
MEAN CORPUSCULAR HEMOGLOBIN CONC: 32.4 g/dL (ref 31.0–37.0)
MEAN CORPUSCULAR HEMOGLOBIN: 32.1 pg (ref 26.0–34.0)
MEAN PLATELET VOLUME: 7.9 fL (ref 7.0–10.0)
MONOCYTES ABSOLUTE COUNT: 0.1 10*9/L — ABNORMAL LOW (ref 0.2–0.8)
MONOCYTES RELATIVE PERCENT: 2.5 %
NEUTROPHILS ABSOLUTE COUNT: 2.1 10*9/L (ref 2.0–7.5)
NEUTROPHILS RELATIVE PERCENT: 63.9 %
PLATELET COUNT: 184 10*9/L (ref 150–440)
RED BLOOD CELL COUNT: 4.09 10*12/L (ref 4.00–5.20)
RED CELL DISTRIBUTION WIDTH: 15 % (ref 12.0–15.0)

## 2018-01-30 LAB — CREATININE
CREATININE: 0.72 mg/dL (ref 0.60–1.00)
Creatinine:MCnc:Pt:Ser/Plas:Qn:: 0.72
EGFR MDRD AF AMER: >=60 mL/min/{1.73_m2} (ref >=60)
EGFR MDRD NON AF AMER: >=60 mL/min/{1.73_m2} (ref >=60)

## 2018-01-30 LAB — HEPATIC FUNCTION PANEL
ALBUMIN: 3.5 g/dL (ref 3.5–5.0)
AST (SGOT): 116 U/L — ABNORMAL HIGH (ref 14–38)
BILIRUBIN TOTAL: 0.4 mg/dL (ref 0.0–1.2)
PROTEIN TOTAL: 6.1 g/dL — ABNORMAL LOW (ref 6.5–8.3)

## 2018-01-30 LAB — COMPREHENSIVE METABOLIC PANEL
ALBUMIN: 3.6 g/dL (ref 3.5–5.0)
ALKALINE PHOSPHATASE: 162 U/L — ABNORMAL HIGH (ref 38–126)
ALT (SGPT): 143 U/L — ABNORMAL HIGH (ref 15–48)
ANION GAP: 7 mmol/L — ABNORMAL LOW (ref 9–15)
AST (SGOT): 119 U/L — ABNORMAL HIGH (ref 14–38)
BILIRUBIN TOTAL: 0.4 mg/dL (ref 0.0–1.2)
BLOOD UREA NITROGEN: 10 mg/dL (ref 7–21)
BUN / CREAT RATIO: 14
CALCIUM: 9 mg/dL (ref 8.5–10.2)
CHLORIDE: 107 mmol/L (ref 98–107)
CO2: 25 mmol/L (ref 22.0–30.0)
CREATININE: 0.72 mg/dL (ref 0.60–1.00)
EGFR MDRD AF AMER: >=60 mL/min/{1.73_m2} (ref >=60)
EGFR MDRD NON AF AMER: >=60 mL/min/{1.73_m2} (ref >=60)
GLUCOSE RANDOM: 153 mg/dL (ref 65–179)
POTASSIUM: 3.7 mmol/L (ref 3.5–5.0)
PROTEIN TOTAL: 6.2 g/dL — ABNORMAL LOW (ref 6.5–8.3)
SODIUM: 139 mmol/L (ref 135–145)

## 2018-01-30 LAB — LIPID PANEL
CHOLESTEROL: 200 mg/dL — ABNORMAL HIGH (ref 100–199)
HDL CHOLESTEROL: 40 mg/dL (ref 40–59)
LDL CHOLESTEROL CALCULATED: 133 mg/dL — ABNORMAL HIGH (ref 60–99)
NON-HDL CHOLESTEROL: 160 mg/dL
TRIGLYCERIDES: 133 mg/dL (ref 1–149)
VLDL CHOLESTEROL CAL: 26.6 mg/dL (ref 9–37)

## 2018-01-30 LAB — PHOSPHORUS
Phosphate:MCnc:Pt:Ser/Plas:Qn:: 3
Phosphate:MCnc:Pt:Ser/Plas:Qn:: 3

## 2018-01-30 LAB — BILIRUBIN TOTAL: Bilirubin:MCnc:Pt:Ser/Plas:Qn:: 0.4

## 2018-01-30 LAB — ALBUMIN
ALBUMIN: 3.6 g/dL (ref 3.5–5.0)
Albumin:MCnc:Pt:Ser/Plas:Qn:: 3.6
Albumin:MCnc:Pt:Ser/Plas:Qn:: 3.6

## 2018-01-30 LAB — TRIGLYCERIDES: Triglyceride:MCnc:Pt:Ser/Plas:Qn:: 133

## 2018-01-30 LAB — BASOPHILS RELATIVE PERCENT: Lab: 1.1

## 2018-01-30 LAB — EGFR MDRD NON AF AMER
Glomerular filtration rate/1.73 sq M.predicted.non black:ArVRat:Pt:Ser/Plas/Bld:Qn:Creatinine-based formula (MDRD): >=60
Glomerular filtration rate/1.73 sq M.predicted.non black:ArVRat:Pt:Ser/Plas/Bld:Qn:Creatinine-based formula (MDRD): >=60

## 2018-01-30 LAB — THYROID STIMULATING HORMONE: Thyrotropin:ACnc:Pt:Ser/Plas:Qn:: 1.658

## 2018-01-30 LAB — CALCIUM
Calcium:MCnc:Pt:Ser/Plas:Qn:: 9
Calcium:MCnc:Pt:Ser/Plas:Qn:: 9

## 2018-01-30 LAB — FREE T4: Thyroxine.free:MCnc:Pt:Ser/Plas:Qn:: 0.8

## 2018-01-30 LAB — HEMOGLOBIN A1C: Hemoglobin A1c/Hemoglobin.total:MFr:Pt:Bld:Qn:: 5.4

## 2018-01-30 LAB — T3 FREE: Triiodothyronine.free:MCnc:Pt:Ser/Plas:Qn:: 2.84

## 2018-01-30 LAB — TSH: THYROID STIMULATING HORMONE: 1.658 u[IU]/mL (ref 0.600–3.300)

## 2018-01-30 MED ORDER — OMEPRAZOLE 20 MG CAPSULE,DELAYED RELEASE
ORAL_CAPSULE | Freq: Every day | ORAL | 3 refills | 0.00000 days | Status: CP
Start: 2018-01-30 — End: 2018-03-22

## 2018-01-30 MED ORDER — RANITIDINE 300 MG TABLET
ORAL_TABLET | Freq: Every evening | ORAL | 3 refills | 0.00000 days | Status: CP
Start: 2018-01-30 — End: 2018-04-24

## 2018-01-30 MED ORDER — ONDANSETRON HCL 4 MG TABLET
ORAL_TABLET | Freq: Three times a day (TID) | ORAL | 0 refills | 0 days | Status: CP | PRN
Start: 2018-01-30 — End: 2018-03-16

## 2018-01-30 MED ORDER — TRAMADOL 50 MG TABLET
ORAL_TABLET | 0 refills | 0 days | Status: CP
Start: 2018-01-30 — End: 2018-04-24

## 2018-01-30 NOTE — Unmapped (Signed)
Addended by: Durene Cal on: 01/30/2018 03:43 PM     Modules accepted: Orders

## 2018-01-30 NOTE — Unmapped (Addendum)
--  If the remainder of today's lab studies are acceptable, we will plan to see you back March 26 with follow-up PET/CT.  --Take omeprazole (Prilosec) in the morning, and ranitidine 300 mg at bedtime.  --Do not hesitate to contact me if your symptoms are not well controlled on your current medications, prior to your follow-up visit.

## 2018-01-30 NOTE — Unmapped (Signed)
VS taken during clinic visit.    Labs completed via [place].     Pt received Faslodex, Lupron and Xgeva injection. Faslodex given in right gluteal. Lupron given in left gluteal. Xgeva given in right upper arm - pt tolerated well.    AVS received at scheduling.    Pt stable and ambulated independently from clinic.

## 2018-01-30 NOTE — Unmapped (Signed)
Pt's port accessed - flushed, bld return noted, labs drawn and heplocked. Pt tolerated well.

## 2018-01-31 NOTE — Unmapped (Signed)
Addended by: Richardo Hanks on: 01/30/2018 06:00 PM     Modules accepted: Orders

## 2018-02-01 ENCOUNTER — Encounter: Admit: 2018-02-01 | Discharge: 2018-02-02 | Payer: MEDICARE | Attending: Family | Primary: Family

## 2018-02-01 DIAGNOSIS — G4733 Obstructive sleep apnea (adult) (pediatric): Principal | ICD-10-CM

## 2018-02-01 DIAGNOSIS — F419 Anxiety disorder, unspecified: Secondary | ICD-10-CM

## 2018-02-01 DIAGNOSIS — F329 Major depressive disorder, single episode, unspecified: Secondary | ICD-10-CM

## 2018-02-01 NOTE — Unmapped (Signed)
Reason for Visit: Follow-up CPAP therapy    History of Present llness: Patient is a 42 year old female with history of breast and cervical cancer, depression, anxiety and sleep apnea.  Patient was originally seen in the sleep clinic in July 2016 with concern of tiredness and exhaustion.  Patient also had several symptoms of sleep apnea.  Patient had a sleep study done August 06, 2015 which showed mild sleep apnea with an overall AHI of 5.3, supine AHI of 10.7 and lateral positional AHI of 2.5.  Patient tried conservative measures for treatment of her sleep apnea but her fatigue and apnea symptoms continued.  She underwent a CPAP titration on January 27, 2018 and she is here today to go over the results.    Medical history:  Past Medical History:   Diagnosis Date   ??? Breast cancer (CMS-HCC)    ??? Cervical cancer (CMS-HCC) 2010 or 2011    Precancerous cervical and endometrial changes.   ??? Depression    ??? Depressive disorder    ??? 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  ARIPiprazole (ABILIFY) 5 MG tablet, Take 1 tablet (5 mg total) by mouth daily., Disp: 30 tablet, Rfl: 1  ???  calcium-magnesium-zinc Tab, Take 2 tablets by mouth daily. , Disp: , Rfl:   ???  cyanocobalamin (VITAMIN B-12) 1000 MCG tablet, Take 1,000 mcg by mouth daily. , Disp: , Rfl:   ???  denosumab (XGEVA) 120 mg/1.7 mL (70 mg/mL) Soln, Inject under the skin., Disp: , Rfl:   ???  docusate sodium (COLACE) 100 MG capsule, Take 100 mg by mouth Three (3) times a day as needed for constipation., Disp: , Rfl:   ???  fulvestrant (FASLODEX) 250 mg/5 mL Syrg, Inject 250 mg into the muscle., Disp: , Rfl:   ???  IBRANCE 125 mg capsule, , Disp: , Rfl:   ???  ketoconazole (NIZORAL) 2 % cream, Apply 1 application topically daily., Disp: 30 g, Rfl: 1  ???  leuprolide (LUPRON) 11.25 mg injection, Inject 11.25 mg into the muscle Every three (3) months., Disp: , Rfl:   ???  liothyronine (CYTOMEL) 25 MCG tablet, Take 1 tablet (25 mcg total) by mouth daily., Disp: 30 tablet, Rfl: 1  ???  MEDICAL SUPPLY ITEM, Compression sleeve with glove, Disp: 1 each, Rfl: 0  ???  melatonin 3 mg Tab, Take 1 tablet (3 mg total) by mouth nightly., Disp: 30 tablet, Rfl: 0  ???  omeprazole (PRILOSEC) 20 MG capsule, TAKE 1 CAPSULE (20 MG TOTAL) BY MOUTH DAILY., Disp: 30 capsule, Rfl: 3  ???  omeprazole (PRILOSEC) 20 MG capsule, Take 1 capsule (20 mg total) by mouth daily., Disp: 30 capsule, Rfl: 3  ???  ondansetron (ZOFRAN) 4 MG tablet, Take 1 tablet (4 mg total) by mouth every eight (8) hours as needed., Disp: 30 tablet, Rfl: 0  ???  PRISTIQ 100 mg 24 hr tablet, Take 1 tablet (100 mg total) by mouth daily. Take 1 Tablet (100 MG Total) by Mouth Daily, Disp: 90 tablet, Rfl: 0  ???  ranitidine (ZANTAC) 300 MG tablet, Take 1 tablet (300 mg total) by mouth nightly., Disp: 30 tablet, Rfl: 3  ???  senna (SENOKOT) 8.6 mg tablet, Take 1 tablet by mouth daily., Disp: , Rfl:   ???  traMADol (ULTRAM) 50 mg tablet, 1 or 2 tablets every 4 to 6 hours as needed  for pain, Disp: 60 tablet, Rfl: 0  No current facility-administered medications for this visit.     Allergies:  Allergies   Allergen Reactions   ??? Adhesive Hives     Surgical glue   ??? Gabapentin Other (See Comments)     weakness in legs, extremely tired, when coming off med., became irritable and very itchy   ??? Bupropion Hcl      lightheadness   ??? Phenergan [Promethazine] Other (See Comments)     Tremors   ??? Cephalexin Itching   ??? Effexor [Venlafaxine] Other (See Comments)     Patient states she is only able to take Desvenlafaxine-Effexor doesn't work.   ??? Latex, Natural Rubber Rash   ??? Penicillins Rash   ??? Percocet [Oxycodone-Acetaminophen] Rash and Other (See Comments)     Dizzy;mentally not right; did not ease pain       Family history:  Family History   Problem Relation Age of Onset   ??? Diabetes Mother    ??? Cancer Father 47        basal cell cancer   ??? Depression Father    ??? Diabetes Brother    ??? Autism Brother    ??? Stroke Maternal Grandmother 34   ??? Thyroid disease Maternal Grandmother    ??? Cancer Maternal Grandfather 44        lung cancer; smoker   ??? Cancer Paternal Grandmother         skin cancer   ??? Mental retardation Brother    ??? Cleft palate Brother    ??? Thyroid disease Maternal Aunt        Tobacco use:  reports that she quit smoking about 8 years ago. She has a 1.75 pack-year smoking history. She has never used smokeless tobacco.   Alcohol use:  reports that she does not drink alcohol.   Drug use:  reports that she does not use drugs.     Review of Systems: Balance of 10 systems is negative, except for reported in history of present illness.    Physical exam: No acute distress, BP 142/89 (BP Site: L Arm, BP Position: Sitting, BP Cuff Size: Large)  - Pulse 83  - Ht 157.5 cm (5' 2)  - Wt 100.7 kg (221 lb 14.4 oz)  - BMI 40.59 kg/m?? , Eyes are clear, noninjected, Nose without discharge,  Neck: Supple,  Heart regular rate regular rhythm, Abdomen obese, Lower extremities without edema.    Neuro: Patient is alert, oriented ??3, speech and memory normal, sensation intact to light touch, no tremors, facial symmetry normal, gait non-ataxic and normal stride.    Labs/Data Reviewed: Previous clinic notes reviewed, Nolan sleep study reviewed from August 06, 2015 and January 27, 2018    Assessment/Impression: Patient is a 42 year old female with history of anxiety, depression, breast and cervical cancer and known sleep apnea.  CPAP titration recommendation is to initiate CPAP therapy at a pressure rate of 9 cm of water.    Plan:    1.  Anxiety and depression.  Continue medications and follow-up with [facility] psychiatry.    2.  Sleep apnea.  Reviewed sleep studies in detail with patient.  Reviewed signs and symptoms of sleep apnea with patient and risk factors for untreated sleep apnea and the benefit of treatment options such as CPAP therapy.  Patient agreed to initiate a trial of CPAP therapy.  CPAP prescription will be sent to [facility] homecare specialist for CPAP set up.  Instructed patient to use  CPAP every night for a minimum of 4 hours for maximum benefit.  She will return for her CPAP compliance visit.    25 minutes or greater face to face time was spent with patient of which greater than half of the time was spent with counseling, education and treatments options that reviewed the diagnosis, assessment and treatment options for sleep apnea    *Patient note was created using dragon dictation. Any errors in syntax or proof reading may not have been identified and edited on initial review prior to signing this note.

## 2018-02-01 NOTE — Unmapped (Addendum)
Learning About CPAP for Sleep Apnea  What is CPAP?    CPAP is a small machine that you use at home every night while you sleep. It increases air pressure in your throat to keep your airway open. When you have sleep apnea, this can help you sleep better so you feel much better. CPAP stands for continuous positive airway pressure.  The CPAP machine will have one of the following:  ?? A mask that covers your nose and mouth  ?? Prongs that fit into your nose  ?? A mask that covers your nose only, the most common type. This type is called NCPAP. The N stands for nasal.  Why is it done?  CPAP is usually the best treatment for obstructive sleep apnea. It is the first treatment choice and the most widely used. Your doctor may suggest CPAP if you have:  ?? Moderate to severe sleep apnea.  ?? Sleep apnea and coronary artery disease (CAD).  ?? Sleep apnea and heart failure.  How does it help?  ?? CPAP can help you have more normal sleep, so you feel less sleepy and more alert during the daytime.  ?? CPAP may help keep heart failure or other heart problems from getting worse.  ?? CPAP may help lower your blood pressure.  ?? If you use CPAP, your bed partner may also sleep better because you are not snoring or restless.  What are the side effects?  Some people who use CPAP have:  ?? A dry or stuffy nose and a sore throat.  ?? Irritated skin on the face.  ?? Sore eyes.  ?? Bloating.  If you have any of these problems, work with your doctor to fix them. Here are some things you can try:  ?? Be sure the mask or nasal prongs fit well.  ?? See if your doctor can adjust the pressure of your CPAP.  ?? If your nose is dry, try a humidifier.  ?? If your nose is runny or stuffy, try decongestant medicine or a steroid nasal spray. Be safe with medicines. Read and follow all instructions on the label. Do not use the medicine longer than the label says.  If these things do not help, you might try a different type of machine. Some machines have air pressure that adjusts on its own. Others have air pressures that are different when you breathe in than when you breathe out. This may reduce discomfort caused by too much pressure in your nose.  Where can you learn more?  Go to [facility] at [email or web address]  Select Preferences in the upper right hand corner, then select Health Library under Resources. Enter [phone] in the search box to learn more about Learning About CPAP for Sleep Apnea.  Current as of: August 09, 2017  Content Version: 11.9  ?? 2006-2018 [employer]. Care instructions adapted under license by [facility]. If you have questions about a medical condition or this instruction, always ask your healthcare professional. [employer] disclaims any warranty or liability for your use of this information.

## 2018-02-02 NOTE — Unmapped (Signed)
[facility] Refill Coordination Note  Specialty Medication(s): [facility] 125  Additional Medications shipped: N/A    Diane Gonzalez, DOB: 01-16-1976  Phone: [phone] (home) [phone] (work), Alternate phone contact: N/A  Phone or address changes today?: No  All above HIPAA information was verified with patient.  Shipping Address: [address]  [place] [address]   Insurance changes? No    Completed refill call assessment today to schedule patient's medication shipment from the [facility] [phone]).      Confirmed the medication and dosage are correct and have not changed: Yes, regimen is correct and unchanged.    Confirmed patient started or stopped the following medications in the past month:  No, there are no changes reported at this time.    Are you tolerating your medication?:  Diane Gonzalez reports tolerating the medication.    ADHERENCE    Patient has 4 tablets left at this time. Sending out next batch on Tuesday for patient.     Did you miss any doses in the past 4 weeks? No missed doses reported.    FINANCIAL/SHIPPING    Delivery Scheduled: Yes, Expected medication delivery date: 02/06/18     The patient will receive an FSI print out for each medication shipped and additional FDA Medication Guides as required.  Patient education from [place] or Robet Leu may also be included in the shipment    Diane Gonzalez did not have any additional questions at this time.    Delivery address validated in FSI scheduling system: Yes, address listed in FSI is correct.    We will follow up with patient monthly for standard refill processing and delivery.      Thank you,  Renette Butters   [facility] Specialty Technician

## 2018-02-05 MED FILL — IBRANCE/125MG/CAPS: IBRANCE/125MG/CAPS | 28 days supply | Qty: 21 | Fill #7

## 2018-02-15 ENCOUNTER — Ambulatory Visit (INDEPENDENT_AMBULATORY_CARE_PROVIDER_SITE_OTHER): Payer: Medicare Other | Admitting: Family Medicine

## 2018-02-15 ENCOUNTER — Encounter: Payer: Self-pay | Admitting: Family Medicine

## 2018-02-15 VITALS — BP 118/82 | HR 95 | Temp 98.7°F | Resp 18

## 2018-02-15 DIAGNOSIS — R1084 Generalized abdominal pain: Secondary | ICD-10-CM

## 2018-02-15 LAB — POCT URINALYSIS DIPSTICK
GLUCOSE UA: NEGATIVE
Ketones, UA: NEGATIVE
Leukocytes, UA: NEGATIVE
Nitrite, UA: NEGATIVE
PH UA: 6.5 (ref 5.0–8.0)
Protein, UA: NEGATIVE
RBC UA: NEGATIVE
Spec Grav, UA: 1.01 (ref 1.010–1.025)
UROBILINOGEN UA: 0.2 U/dL

## 2018-02-15 NOTE — Progress Notes (Signed)
Patient: Kimberly Key Female    DOB: 09/25/1976   42 y.o.   MRN: [id number] Visit Date: 02/15/2018  Today's Provider: Lelon Huh, MD   Chief Complaint  Patient presents with  . Nausea   Subjective:    HPI Nausea and Vomiting: Patient comes in today complaining of intermittent nausea and vomiting for the past month. Patient reports that noting aggravates symptoms. She has also had generalized abdominal pain. She denies any diarrhea or constipation, fever, dysuria or changes in bowel habits.  Sometimes has vomiting in morning with undigested food.   Has been on prevacid for years and oncologist added Zofran and Zantac recently which hasn't helped pains at all and makes her constipated. States pain is exacerbated by eating, but not any particular foods. Labs at [facility] in January showed mildly elevated alk phosphatase,, but otherwise normal.     Allergies  Allergen Reactions  . Bupropion Hcl     lightheadness  . Cephalexin Itching  . Latex Rash  . Oxycodone-Acetaminophen Anxiety and Rash    Altered Mental Status  . Penicillins Rash  . Promethazine Rash    Tremors  . Venlafaxine Rash and Other (See Comments)    Patient states she is only able to take Desvenlafaxine-Effexor doesn't work. Patient states she is only able to take Desvenlafaxine     Current Outpatient Medications:  .  ARIPiprazole (ABILIFY) 5 MG tablet, Take 5 mg by mouth daily., Disp: , Rfl: 1 .  chlorhexidine (PERIDEX) 0.12 % solution, 15 mLs by Mouth Rinse route 2 (two) times daily., Disp: , Rfl: 2 .  Denosumab (XGEVA [place]), Inject 1 Dose into the skin. Every 45 days, Disp: , Rfl:  .  DENTA 5000 PLUS 1.1 % CREA dental cream, Place 1 application onto teeth as needed., Disp: , Rfl: 3 .  desvenlafaxine (PRISTIQ) 100 MG 24 hr tablet, TAKE 1 TABLET (100 MG TOTAL) BY MOUTH DAILY., Disp: , Rfl: 5 .  fulvestrant (FASLODEX) 250 MG/5ML injection, Inject 250 mg into the muscle every 30 (thirty) days. One injection  each buttock over 1-2 minutes. Warm prior to use., Disp: , Rfl:  .  IBRANCE 125 MG capsule, Take 1 capsule by mouth daily. For 21 days, Disp: , Rfl:  .  Leuprolide Acetate (LUPRON DEPOT, 34-MONTH, IM), Inject 1 Dose into the muscle every 30 (thirty) days. , Disp: , Rfl:  .  liothyronine (CYTOMEL) 25 MCG tablet, Take 25 mcg by mouth daily., Disp: , Rfl: 1 .  omeprazole (PRILOSEC) 20 MG capsule, Take 20 mg by mouth., Disp: , Rfl:  .  ondansetron (ZOFRAN) 4 MG tablet, Take 4 mg by mouth., Disp: , Rfl:  .  ranitidine (ZANTAC) 300 MG tablet, Take 1 tablet by mouth daily., Disp: , Rfl: 3 .  traMADol (ULTRAM) 50 MG tablet, 1 or 2 tablets every 4 to 6 hours as needed for pain, Disp: , Rfl:  .  albuterol (PROVENTIL HFA;VENTOLIN HFA) 108 (90 Base) MCG/ACT inhaler, Inhale 2 puffs into the lungs every 6 (six) hours as needed for wheezing or shortness of breath. (Patient not taking: Reported on 02/15/2018), Disp: 1 Inhaler, Rfl: 2 .  Calcium-Magnesium-Zinc 333-133-5 MG TABS, Take by mouth., Disp: , Rfl:  .  doxycycline (VIBRA-TABS) 100 MG tablet, Take 1 tablet (100 mg total) by mouth 2 (two) times daily. (Patient not taking: Reported on 02/15/2018), Disp: 20 tablet, Rfl: 0 .  gabapentin (NEURONTIN) 100 MG capsule, Start 1 tab PO qhs, increase by  1 tab as needed up to 3 tabs PO qhs, Disp: , Rfl:   Review of Systems  Constitutional: Negative for appetite change, chills, fatigue and fever.  Respiratory: Negative for chest tightness and shortness of breath.   Cardiovascular: Negative for chest pain and palpitations.  Gastrointestinal: Positive for abdominal distention, abdominal pain, nausea and vomiting. Negative for blood in stool, constipation and diarrhea.  Neurological: Negative for dizziness and weakness.    Social History   Tobacco Use  . Smoking status: Former Smoker    Packs/day: 0.75    Years: 5.00    Pack years: 3.75    Types: Cigarettes    Last attempt to quit: 06/04/2013    Years since quitting:  4.7  . Smokeless tobacco: Never Used  Substance Use Topics  . Alcohol use: No    Alcohol/week: 0.0 oz   Objective:   BP 118/82 (BP Location: Right Arm, Patient Position: Sitting, Cuff Size: Large)   Pulse 95   Temp 98.7 F (37.1 C) (Oral)   Resp 18   SpO2 95% Comment: room air There were no vitals filed for this visit.   Physical Exam  General Appearance:    Alert, cooperative, no distress  Eyes:    PERRL, conjunctiva/corneas clear, EOM's intact       Lungs:     Clear to auscultation bilaterally, respirations unlabored  Heart:    Regular rate and rhythm  Abdomen:   soft or round. No CVA tenderness. Minimal epigastric tenderness. Hypoactive bowel sounds.    Results for orders placed or performed in visit on 02/15/18  POCT Urinalysis Dipstick  Result Value Ref Range   Color, UA amber    Clarity, UA clear    Glucose, UA negative    Bilirubin, UA small    Ketones, UA negative    Spec Grav, UA 1.010 1.010 - 1.025   Blood, UA negative    pH, UA 6.5 5.0 - 8.0   Protein, UA negative    Urobilinogen, UA 0.2 0.2 or 1.0 E.U./dL   Nitrite, UA negative    Leukocytes, UA Negative Negative   Appearance     Odor          Assessment & Plan:     1. Generalized abdominal pain  - POCT Urinalysis Dipstick - H. pylori breath test - US Abdomen Complete; Future      Lelon Huh, MD  [facility]

## 2018-02-17 LAB — H. PYLORI BREATH TEST: H pylori Breath Test: NEGATIVE

## 2018-02-20 ENCOUNTER — Emergency Department: Payer: Medicare Other

## 2018-02-20 ENCOUNTER — Ambulatory Visit
Admission: RE | Admit: 2018-02-20 | Discharge: 2018-02-20 | Disposition: A | Discharge status: Home / Self Care | Payer: Medicare Other | Source: Ambulatory Visit | Attending: Family Medicine | Admitting: Family Medicine

## 2018-02-20 ENCOUNTER — Encounter: Payer: Self-pay | Admitting: Emergency Medicine

## 2018-02-20 ENCOUNTER — Other Ambulatory Visit: Payer: Self-pay

## 2018-02-20 ENCOUNTER — Inpatient Hospital Stay
Admission: EM | Admit: 2018-02-20 | Discharge: 2018-02-22 | DRG: 437 | Disposition: A | Discharge status: Home / Self Care | Payer: Medicare Other | Attending: Internal Medicine | Admitting: Internal Medicine

## 2018-02-20 DIAGNOSIS — C787 Secondary malignant neoplasm of liver and intrahepatic bile duct: Secondary | ICD-10-CM

## 2018-02-20 DIAGNOSIS — Z888 Allergy status to other drugs, medicaments and biological substances status: Secondary | ICD-10-CM | POA: Diagnosis not present

## 2018-02-20 DIAGNOSIS — J209 Acute bronchitis, unspecified: Secondary | ICD-10-CM | POA: Diagnosis present

## 2018-02-20 DIAGNOSIS — Z170 Estrogen receptor positive status [ER+]: Secondary | ICD-10-CM

## 2018-02-20 DIAGNOSIS — Z91040 Latex allergy status: Secondary | ICD-10-CM | POA: Diagnosis not present

## 2018-02-20 DIAGNOSIS — R109 Unspecified abdominal pain: Secondary | ICD-10-CM | POA: Diagnosis present

## 2018-02-20 DIAGNOSIS — G4733 Obstructive sleep apnea (adult) (pediatric): Secondary | ICD-10-CM | POA: Diagnosis present

## 2018-02-20 DIAGNOSIS — Z87891 Personal history of nicotine dependence: Secondary | ICD-10-CM | POA: Diagnosis not present

## 2018-02-20 DIAGNOSIS — Z90710 Acquired absence of both cervix and uterus: Secondary | ICD-10-CM

## 2018-02-20 DIAGNOSIS — Z885 Allergy status to narcotic agent status: Secondary | ICD-10-CM | POA: Diagnosis not present

## 2018-02-20 DIAGNOSIS — Z880 Allergy status to penicillin: Secondary | ICD-10-CM

## 2018-02-20 DIAGNOSIS — Z9011 Acquired absence of right breast and nipple: Secondary | ICD-10-CM

## 2018-02-20 DIAGNOSIS — C50912 Malignant neoplasm of unspecified site of left female breast: Secondary | ICD-10-CM | POA: Diagnosis present

## 2018-02-20 DIAGNOSIS — R739 Hyperglycemia, unspecified: Secondary | ICD-10-CM | POA: Diagnosis present

## 2018-02-20 DIAGNOSIS — Z79899 Other long term (current) drug therapy: Secondary | ICD-10-CM

## 2018-02-20 DIAGNOSIS — R112 Nausea with vomiting, unspecified: Secondary | ICD-10-CM | POA: Diagnosis present

## 2018-02-20 DIAGNOSIS — Z79818 Long term (current) use of other agents affecting estrogen receptors and estrogen levels: Secondary | ICD-10-CM | POA: Diagnosis not present

## 2018-02-20 DIAGNOSIS — R1084 Generalized abdominal pain: Secondary | ICD-10-CM

## 2018-02-20 DIAGNOSIS — C779 Secondary and unspecified malignant neoplasm of lymph node, unspecified: Secondary | ICD-10-CM | POA: Diagnosis not present

## 2018-02-20 DIAGNOSIS — R1011 Right upper quadrant pain: Secondary | ICD-10-CM

## 2018-02-20 DIAGNOSIS — R5383 Other fatigue: Secondary | ICD-10-CM | POA: Diagnosis not present

## 2018-02-20 DIAGNOSIS — F329 Major depressive disorder, single episode, unspecified: Secondary | ICD-10-CM | POA: Diagnosis present

## 2018-02-20 DIAGNOSIS — R748 Abnormal levels of other serum enzymes: Secondary | ICD-10-CM

## 2018-02-20 DIAGNOSIS — R05 Cough: Secondary | ICD-10-CM | POA: Diagnosis present

## 2018-02-20 DIAGNOSIS — C50919 Malignant neoplasm of unspecified site of unspecified female breast: Secondary | ICD-10-CM | POA: Diagnosis not present

## 2018-02-20 DIAGNOSIS — R079 Chest pain, unspecified: Secondary | ICD-10-CM | POA: Diagnosis present

## 2018-02-20 DIAGNOSIS — Z9221 Personal history of antineoplastic chemotherapy: Secondary | ICD-10-CM | POA: Diagnosis not present

## 2018-02-20 DIAGNOSIS — Z923 Personal history of irradiation: Secondary | ICD-10-CM | POA: Diagnosis not present

## 2018-02-20 LAB — URINALYSIS, COMPLETE (UACMP) WITH MICROSCOPIC
Bilirubin Urine: NEGATIVE
Glucose, UA: NEGATIVE mg/dL
Hgb urine dipstick: NEGATIVE
Ketones, ur: NEGATIVE mg/dL
LEUKOCYTES UA: NEGATIVE
Nitrite: NEGATIVE
PROTEIN: 30 mg/dL — AB
SPECIFIC GRAVITY, URINE: 1.02 (ref 1.005–1.030)
pH: 6 (ref 5.0–8.0)

## 2018-02-20 LAB — COMPREHENSIVE METABOLIC PANEL
ALT: 86 U/L — ABNORMAL HIGH (ref 14–54)
AST: 167 U/L — ABNORMAL HIGH (ref 15–41)
Albumin: 2.8 g/dL — ABNORMAL LOW (ref 3.5–5.0)
Alkaline Phosphatase: 246 U/L — ABNORMAL HIGH (ref 38–126)
Anion gap: 9 (ref 5–15)
BUN: 9 mg/dL (ref 6–20)
CHLORIDE: 105 mmol/L (ref 101–111)
CO2: 20 mmol/L — AB (ref 22–32)
Calcium: 8.3 mg/dL — ABNORMAL LOW (ref 8.9–10.3)
Creatinine, Ser: 0.66 mg/dL (ref 0.44–1.00)
GFR calc Af Amer: >60 mL/min (ref >60)
Glucose, Bld: 196 mg/dL — ABNORMAL HIGH (ref 65–99)
POTASSIUM: 3.5 mmol/L (ref 3.5–5.1)
SODIUM: 134 mmol/L — AB (ref 135–145)
Total Bilirubin: 2 mg/dL — ABNORMAL HIGH (ref 0.3–1.2)
Total Protein: 6.4 g/dL — ABNORMAL LOW (ref 6.5–8.1)

## 2018-02-20 LAB — CBC
HEMATOCRIT: 43.3 % (ref 35.0–47.0)
HEMOGLOBIN: 14.5 g/dL (ref 12.0–16.0)
MCH: 32.5 pg (ref 26.0–34.0)
MCHC: 33.5 g/dL (ref 32.0–36.0)
MCV: 97.1 fL (ref 80.0–100.0)
Platelets: 99 10*3/uL — ABNORMAL LOW (ref 150–440)
RBC: 4.46 MIL/uL (ref 3.80–5.20)
RDW: 15.7 % — ABNORMAL HIGH (ref 11.5–14.5)
WBC: 2.6 10*3/uL — AB (ref 3.6–11.0)

## 2018-02-20 LAB — GLUCOSE, CAPILLARY: Glucose-Capillary: 152 mg/dL — ABNORMAL HIGH (ref 65–99)

## 2018-02-20 LAB — LIPASE, BLOOD: LIPASE: 38 U/L (ref 11–51)

## 2018-02-20 MED ORDER — ONDANSETRON HCL 4 MG PO TABS: 4.0000 mg | ORAL_TABLET | Freq: Four times a day (QID) | ORAL | Status: DC | PRN

## 2018-02-20 MED ORDER — TRAMADOL HCL 50 MG PO TABS: 100.0000 mg | ORAL_TABLET | Freq: Four times a day (QID) | ORAL | Status: DC | PRN

## 2018-02-20 MED ORDER — LIOTHYRONINE SODIUM 25 MCG PO TABS
25.0000 ug | ORAL_TABLET | Freq: Every day | ORAL | Status: DC
  Administered 2018-02-21: 25 ug via ORAL
  Filled 2018-02-20 (×4): qty 1

## 2018-02-20 MED ORDER — MORPHINE SULFATE (PF) 4 MG/ML IV SOLN
4.0000 mg | INTRAVENOUS | Status: DC | PRN
  Administered 2018-02-20 – 2018-02-21 (×4): 4 mg via INTRAVENOUS
  Filled 2018-02-20 (×5): qty 1

## 2018-02-20 MED ORDER — ALBUTEROL SULFATE (2.5 MG/3ML) 0.083% IN NEBU: 3.0000 mL | INHALATION_SOLUTION | Freq: Four times a day (QID) | RESPIRATORY_TRACT | Status: DC | PRN

## 2018-02-20 MED ORDER — ENOXAPARIN SODIUM 40 MG/0.4ML ~~LOC~~ SOLN: 40.0000 mg | SUBCUTANEOUS | Status: DC

## 2018-02-20 MED ORDER — PANTOPRAZOLE SODIUM 40 MG PO TBEC
40.0000 mg | DELAYED_RELEASE_TABLET | Freq: Every day | ORAL | Status: DC
  Administered 2018-02-21 (×2): 40 mg via ORAL
  Filled 2018-02-20 (×2): qty 1

## 2018-02-20 MED ORDER — PALBOCICLIB 125 MG PO CAPS
125.0000 mg | ORAL_CAPSULE | Freq: Every day | ORAL | Status: DC
  Administered 2018-02-21: 18:00:00 125 mg via ORAL
  Filled 2018-02-20: qty 1

## 2018-02-20 MED ORDER — ONDANSETRON HCL 4 MG/2ML IJ SOLN
4.0000 mg | Freq: Four times a day (QID) | INTRAMUSCULAR | Status: DC | PRN
  Administered 2018-02-21 – 2018-02-22 (×2): 4 mg via INTRAVENOUS
  Filled 2018-02-20 (×4): qty 2

## 2018-02-20 MED ORDER — MORPHINE SULFATE (PF) 4 MG/ML IV SOLN
4.0000 mg | Freq: Once | INTRAVENOUS | Status: AC
  Administered 2018-02-20: 4 mg via INTRAVENOUS
  Filled 2018-02-20: qty 1

## 2018-02-20 MED ORDER — ARIPIPRAZOLE 5 MG PO TABS
5.0000 mg | ORAL_TABLET | Freq: Every day | ORAL | Status: DC
  Administered 2018-02-21: 22:00:00 5 mg via ORAL
  Filled 2018-02-20 (×4): qty 1

## 2018-02-20 MED ORDER — SODIUM CHLORIDE 0.9 % IV BOLUS (SEPSIS)
1000.0000 mL | Freq: Once | INTRAVENOUS | Status: AC
  Administered 2018-02-20: 1000 mL via INTRAVENOUS

## 2018-02-20 MED ORDER — SODIUM CHLORIDE 0.9 % IV SOLN: 250.0000 mL | INTRAVENOUS | Status: DC | PRN

## 2018-02-20 MED ORDER — ENOXAPARIN SODIUM 40 MG/0.4ML ~~LOC~~ SOLN
40.0000 mg | SUBCUTANEOUS | Status: DC
  Administered 2018-02-20 – 2018-02-21 (×2): 40 mg via SUBCUTANEOUS
  Filled 2018-02-20 (×2): qty 0.4

## 2018-02-20 MED ORDER — INSULIN ASPART 100 UNIT/ML ~~LOC~~ SOLN: 0.0000 [IU] | Freq: Three times a day (TID) | SUBCUTANEOUS | Status: DC

## 2018-02-20 MED ORDER — SODIUM CHLORIDE 0.9 % IV SOLN
INTRAVENOUS | Status: DC
  Administered 2018-02-20 – 2018-02-21 (×2): via INTRAVENOUS

## 2018-02-20 MED ORDER — FAMOTIDINE 20 MG PO TABS
20.0000 mg | ORAL_TABLET | Freq: Two times a day (BID) | ORAL | Status: DC
  Administered 2018-02-20 – 2018-02-22 (×4): 20 mg via ORAL
  Filled 2018-02-20 (×4): qty 1

## 2018-02-20 MED ORDER — ONDANSETRON HCL 4 MG/2ML IJ SOLN
4.0000 mg | Freq: Four times a day (QID) | INTRAMUSCULAR | Status: DC | PRN
  Administered 2018-02-20 – 2018-02-21 (×2): 4 mg via INTRAVENOUS

## 2018-02-20 MED ORDER — ACETAMINOPHEN 650 MG RE SUPP: 650.0000 mg | Freq: Four times a day (QID) | RECTAL | Status: DC | PRN

## 2018-02-20 MED ORDER — SODIUM CHLORIDE 0.9% FLUSH: 3.0000 mL | INTRAVENOUS | Status: DC | PRN

## 2018-02-20 MED ORDER — PROCHLORPERAZINE EDISYLATE 5 MG/ML IJ SOLN
10.0000 mg | Freq: Once | INTRAMUSCULAR | Status: AC
  Administered 2018-02-20: 10 mg via INTRAVENOUS
  Filled 2018-02-20: qty 2

## 2018-02-20 MED ORDER — ACETAMINOPHEN 325 MG PO TABS
650.0000 mg | ORAL_TABLET | Freq: Four times a day (QID) | ORAL | Status: DC | PRN
  Administered 2018-02-22 (×2): 650 mg via ORAL
  Filled 2018-02-20 (×3): qty 2

## 2018-02-20 MED ORDER — SODIUM CHLORIDE 0.9% FLUSH
3.0000 mL | Freq: Two times a day (BID) | INTRAVENOUS | Status: DC
  Administered 2018-02-22: 03:00:00 3 mL via INTRAVENOUS

## 2018-02-20 MED ORDER — DESVENLAFAXINE SUCCINATE ER 100 MG PO TB24
100.0000 mg | ORAL_TABLET | Freq: Every day | ORAL | Status: DC
  Administered 2018-02-21 – 2018-02-22 (×2): 100 mg via ORAL
  Filled 2018-02-20 (×2): qty 1

## 2018-02-20 MED ORDER — IOPAMIDOL (ISOVUE-300) INJECTION 61%
100.0000 mL | Freq: Once | INTRAVENOUS | Status: AC | PRN
  Administered 2018-02-20: 100 mL via INTRAVENOUS

## 2018-02-20 MED ORDER — OXYCODONE-ACETAMINOPHEN 5-325 MG PO TABS: 1.0000 | ORAL_TABLET | Freq: Once | ORAL | Status: DC

## 2018-02-20 NOTE — ED Triage Notes (Signed)
First nurse.  Right upper quad pain for couple months with nausea.  Had ultrasound done this am.  Says she is having increased pain

## 2018-02-20 NOTE — ED Provider Notes (Addendum)
[facility] Emergency Department Provider Note ____________________________________________   First MD Initiated Contact with Patient 02/20/18 1543     (approximate)  I have reviewed the triage vital signs and the nursing notes.   HISTORY  Chief Complaint Emesis and Abdominal Pain    HPI Kimberly Key is a 42 y.o. female with past medical history as noted below including history of breast cancer who presents with right upper quadrant abdominal and right flank pain for the last several weeks, with episodes of vomiting, usually occurring in the morning, for approximately the last 4 weeks.  Patient states she been vomiting about once per day.  She states that her bowel movements have been normal, and denies urinary symptoms.  She also reports a cough over the last several weeks.  The patient states that she had an ultrasound performed this morning, and when she called her doctor to follow-up and let him know that her symptoms are worsening she was told to come the emergency department.   Past Medical History:  Diagnosis Date  . Acute appendicitis with localized peritonitis   . 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 Alteration consciousness 06/14/2016  . Appendicitis 05/16/2017  . Bilateral polycystic ovarian syndrome 05/04/2016  . Bradycardia 05/04/2016  . Breast CA ([place]) 12/11/2013  . Breast lump 11/25/2013  . Cancer [facility])    Breast Cancer left with lumpectomy  . Chronic headache   . Depression   . History of abnormal cervical Pap smear    09/24/2010- abnormal, HPV Positive, ASCUS  . History of methicillin resistant Staphylococcus aureus infection 05/04/2016  . Impaired glucose tolerance   . Low back pain 05/13/2016  . 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 Mild dysplasia of cervix   . Nausea 05/13/2016  . Other acute  appendicitis   . Panic attack as reaction to stress 05/04/2016  . Pre-syncope 05/13/2016  . Recurrent major depressive disorder, in partial remission ([place]) 05/07/2015  . Steatohepatitis   . Tinea versicolor 05/04/2016  . Urinary tract infection 08/14/2015    Patient Active Problem List   Diagnosis Date Noted  . Obstructive sleep apnea 01/30/2018  . Appendicitis 05/16/2017  . Acute appendicitis with localized peritonitis   . Other acute appendicitis   . Alteration consciousness 06/14/2016  . Nausea 05/13/2016  . Low back pain 05/13/2016  . Pre-syncope 05/13/2016  . 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 Bradycardia 05/04/2016  . Chronic headache 05/04/2016  . History of methicillin resistant Staphylococcus aureus infection 05/04/2016  . Impaired glucose tolerance 05/04/2016  . Tinea versicolor 05/04/2016  . Panic attack as reaction to stress 05/04/2016  . Bilateral polycystic ovarian syndrome 05/04/2016  . Steatohepatitis 05/04/2016  . Mild cervical dysplasia 05/04/2016  . 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 Recurrent major depressive disorder, in partial remission ([place]) 05/07/2015  . Breast CA ([place]) 12/11/2013  . Breast lump 11/25/2013    Past Surgical History:  Procedure Laterality Date  . 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 LAPAROSCOPIC APPENDECTOMY N/A 05/16/2017   Procedure: APPENDECTOMY LAPAROSCOPIC;  Surgeon: Jules Husbands, MD;  Location: [facility] ORS;  Service: General;  Laterality: N/A;  . MASTECTOMY Right 12/31/2013   [facility], Simple mastectomy with sentinal node biopsy    Prior to Admission medications  Medication Sig Start Date End Date Taking? Authorizing Provider  albuterol (PROVENTIL HFA;VENTOLIN HFA) 108 (90 Base) MCG/ACT inhaler Inhale 2 puffs into the lungs every 6 (six) hours as needed for wheezing or shortness of breath. Patient not taking: Reported on  02/15/2018 06/05/17   Carmon Ginsberg, PA  ARIPiprazole (ABILIFY) 5 MG tablet Take 5 mg by mouth daily. 02/07/18   [provider]  chlorhexidine (PERIDEX) 0.12 % solution 15 mLs by Mouth Rinse route 2 (two) times daily. 04/19/17   [provider]  Denosumab (XGEVA ) Inject 1 Dose into the skin. Every 45 days    [provider]  DENTA 5000 PLUS 1.1 % CREA dental cream Place 1 application onto teeth as needed. 04/19/17   [provider]  desvenlafaxine (PRISTIQ) 100 MG 24 hr tablet TAKE 1 TABLET (100 MG TOTAL) BY MOUTH DAILY. 04/09/17   [provider]  fulvestrant (FASLODEX) 250 MG/5ML injection Inject 250 mg into the muscle every 30 (thirty) days. One injection each buttock over 1-2 minutes. Warm prior to use.    [provider]  IBRANCE 125 MG capsule Take 1 capsule by mouth daily. For 21 days 02/05/18   [provider]  Leuprolide Acetate (LUPRON DEPOT, 28-MONTH, IM) Inject 1 Dose into the muscle every 30 (thirty) days.     [provider]  liothyronine (CYTOMEL) 25 MCG tablet Take 25 mcg by mouth daily. 01/31/18   [provider]  omeprazole (PRILOSEC) 20 MG capsule Take 20 mg by mouth. 03/01/17 03/01/18  [provider]  ondansetron (ZOFRAN) 4 MG tablet Take 4 mg by mouth. 05/13/16   [provider]  ranitidine (ZANTAC) 300 MG tablet Take 1 tablet by mouth daily. 01/30/18   [provider]  traMADol (ULTRAM) 50 MG tablet 1 or 2 tablets every 4 to 6 hours as needed for pain 12/26/16   [provider]    Allergies Bupropion hcl; Cephalexin; Latex; Oxycodone-acetaminophen; Penicillins; Promethazine; and Venlafaxine  Family History  Problem Relation Age of Onset  . Diabetes Mother   . Gallbladder disease Mother   . Coronary artery disease Other   . Depression Other   . Cancer Other   . Seizures Other   . Anxiety disorder Father   . Gallbladder disease Sister   . Diabetes Paternal Uncle          type 2    Social History Social History   Tobacco Use  . Smoking status: Former Smoker    Packs/day: 0.75    Years: 5.00    Pack years: 3.75    Types: Cigarettes    Last attempt to quit: 06/04/2013    Years since quitting: 4.7  . Smokeless tobacco: Never Used  Substance Use Topics  . Alcohol use: No    Alcohol/week: 0.0 oz  . Drug use: No    Review of Systems  Constitutional: No fever. Eyes: No jaundice. ENT: No sore throat. Cardiovascular: Denies chest pain. Respiratory: Positive for cough. Gastrointestinal: Positive for vomiting.  Genitourinary: Negative for dysuria.  Musculoskeletal: Negative for back pain. Skin: Negative for rash. Neurological: Negative for headache.   ____________________________________________   PHYSICAL EXAM:  VITAL SIGNS: ED Triage Vitals  Enc Vitals Group     BP 02/20/18 1048 122/69     Pulse Rate 02/20/18 1048 (!) 113     Resp 02/20/18 1048 16     Temp 02/20/18 1048 99.1 F (37.3 C)     Temp src --  SpO2 02/20/18 1048 96 %     Weight 02/20/18 1047 220 lb (99.8 kg)     Height 02/20/18 1047 5\' 3"  (1.6 m)     Head Circumference --      Peak Flow --      Pain Score 02/20/18 1046 7     Pain Loc --      Pain Edu? --      Excl. in [place]? --     Constitutional: Alert and oriented. Well appearing and in no acute distress. Eyes: Conjunctivae are normal.  No scleral icterus.  Head: Atraumatic. Nose: No congestion/rhinnorhea. Mouth/Throat: Mucous membranes are moist.   Neck: Normal range of motion.  Cardiovascular: Normal rate, regular rhythm. Grossly normal heart sounds.  Good peripheral circulation. Respiratory: Normal respiratory effort.  No retractions. Lungs CTAB. Gastrointestinal: Soft with mild right upper quadrant tenderness. No distention.  Genitourinary: No flank tenderness. Musculoskeletal:  Extremities warm and well perfused.  Neurologic:  Normal speech and language. No gross focal neurologic deficits are  appreciated.  Skin:  Skin is warm and dry. No rash noted. Psychiatric: Mood and affect are normal. Speech and behavior are normal.  ____________________________________________   LABS (all labs ordered are listed, but only abnormal results are displayed)  Labs Reviewed  COMPREHENSIVE METABOLIC PANEL - Abnormal; Notable for the following components:      Result Value   Sodium 134 (*)    CO2 20 (*)    Glucose, Bld 196 (*)    Calcium 8.3 (*)    Total Protein 6.4 (*)    Albumin 2.8 (*)    AST 167 (*)    ALT 86 (*)    Alkaline Phosphatase 246 (*)    Total Bilirubin 2.0 (*)    All other components within normal limits  CBC - Abnormal; Notable for the following components:   WBC 2.6 (*)    RDW 15.7 (*)    Platelets 99 (*)    All other components within normal limits  URINALYSIS, COMPLETE (UACMP) WITH MICROSCOPIC - Abnormal; Notable for the following components:   Color, Urine AMBER (*)    APPearance HAZY (*)    Protein, ur 30 (*)    Bacteria, UA RARE (*)    Squamous Epithelial / LPF 0-5 (*)    All other components within normal limits  LIPASE, BLOOD   ____________________________________________  EKG  ED ECG REPORT I, Arta Silence, the attending physician, personally viewed and interpreted this ECG.  Date: 02/20/2018 EKG Time: 1837 Rate: 97 Rhythm: normal sinus rhythm QRS Axis: normal Intervals: Short PR, borderline prolonged QTc ST/T Wave abnormalities: Abnormal R wave progression Narrative Interpretation: no evidence of acute ischemia  ____________________________________________  RADIOLOGY  [place] RUQ (performed prior to ED visit today): Abnormal appearance of liver with numerous rounded masses.   CT abd: multiple  ____________________________________________   PROCEDURES  Procedure(s) performed: No  Procedures  Critical Care performed: No ____________________________________________   INITIAL IMPRESSION / ASSESSMENT AND PLAN / ED  COURSE  Pertinent labs & imaging results that were available during my care of the patient were reviewed by me and considered in my medical decision making (see chart for details).  42 year old female with past medical history as noted above including breast cancer (and apparent removal of liver mass) presents with several weeks of right upper quadrant pain as well as intermittent vomiting especially in the morning.  She also reports some cough recently.  Past medical records reviewed in Epic; patient had ultrasound performed this  morning which reveals numerous masses in the liver concerning for metastatic disease.  On exam, vitals are normal except for borderline tachycardia, and the patient is otherwise relatively well-appearing with mild right upper quadrant tenderness.  Differential includes recurrent metastatic disease, pancreatitis, other hepatobiliary cause, or unrelated GI cause such as colitis or diverticulitis.  Will obtain CT abdomen, labs, and reassess.    ----------------------------------------- 6:19 PM on 02/20/2018 -----------------------------------------  CT confirms multiple liver masses consistent with metastases, which would explain patient's symptoms as well as the elevated LFTs.  Chest x-ray shows no significant acute findings.  I discussed the plan with the patient.  She had previously followed with oncology at [facility].  She feels that due to her worsening pain as well as her recurrent vomiting and generalized weakness, she does not feel comfortable going home at this time.  I feel that it is reasonable to admit the patient for symptom control, and possible initiation of further cancer workup.  I signed the patient out to the hospitalist Dr. Posey Pronto.  ____________________________________________   FINAL CLINICAL IMPRESSION(S) / ED DIAGNOSES  Final diagnoses:  Liver metastases ([place])  Right upper quadrant abdominal pain  Elevated liver enzymes      NEW  MEDICATIONS STARTED DURING THIS VISIT:  New Prescriptions   No medications on file     Note:  This document was prepared using Dragon voice recognition software and may include unintentional dictation errors.        Arta Silence, MD 02/20/18 1840

## 2018-02-20 NOTE — ED Triage Notes (Signed)
Pt to ED via POV c/o abdominal pain x 2 weeks and vomiting x 4 weeks. Pt states that in the last 3 days her symptoms have gotten worse. Pt states that she is unable to get comfortable. Pt denies diarrhea or fevers. Pt in NAD at this time.

## 2018-02-20 NOTE — H&P (Signed)
[facility] NAME: Kimberly Key    MR#:  [id number]  DATE OF BIRTH:  [birth year]  DATE OF ADMISSION:  02/20/2018  PRIMARY CARE PHYSICIAN: Birdie Sons, MD   REQUESTING/REFERRING PHYSICIAN:   CHIEF COMPLAINT:   Chief Complaint  Patient presents with  . Emesis  . Abdominal Pain    HISTORY OF PRESENT ILLNESS: Kimberly Key  is a 42 y.o. female with a known history of breast cancer who is followed at [facility] presenting to the emergency room with complaint of right upper quadrant abdominal pain as well as emesis.  Patient states that the symptoms have been going on for the past 4 weeks.  She was seen by her primary care provider and had ultrasound of the liver which showed metastatic liver disease.  Patient does have a history of having apparently a lesion in the liver which apparently was resected.  And had a CT scan last year which showed no evidence of metastatic liver disease.  Today she had a CT scan which showed metastases in the liver.  Due to persistent abdominal pain were asked to admit the patient.  PAST MEDICAL HISTORY:   Past Medical History:  Diagnosis Date  . Acute appendicitis with localized peritonitis   . Adenocarcinoma of left breast ([place]) 05/04/2016   Kristalyn K Gallagher [facility], Simple Mastectomy with sentinal node biopsy   . Alteration consciousness 06/14/2016  . Appendicitis 05/16/2017  . Bilateral polycystic ovarian syndrome 05/04/2016  . Bradycardia 05/04/2016  . Breast CA ([place]) 12/11/2013  . Breast lump 11/25/2013  . Cancer [facility])    Breast Cancer left with lumpectomy  . Chronic headache   . Depression   . History of abnormal cervical Pap smear    09/24/2010- abnormal, HPV Positive, ASCUS  . History of methicillin resistant Staphylococcus aureus infection 05/04/2016  . Impaired glucose tolerance   . Low back pain 05/13/2016  . Major depressive disorder, recurrent severe without psychotic features ([place]) 05/07/2015  . Mild  dysplasia of cervix   . Nausea 05/13/2016  . Other acute appendicitis   . Panic attack as reaction to stress 05/04/2016  . Pre-syncope 05/13/2016  . Recurrent major depressive disorder, in partial remission ([place]) 05/07/2015  . Steatohepatitis   . Tinea versicolor 05/04/2016  . Urinary tract infection 08/14/2015    PAST SURGICAL HISTORY:  Past Surgical History:  Procedure Laterality Date  . 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 LAPAROSCOPIC APPENDECTOMY N/A 05/16/2017   Procedure: APPENDECTOMY LAPAROSCOPIC;  Surgeon: Jules Husbands, MD;  Location: [facility] ORS;  Service: General;  Laterality: N/A;  . MASTECTOMY Right 12/31/2013   [facility], Simple mastectomy with sentinal node biopsy    SOCIAL HISTORY:  Social History   Tobacco Use  . Smoking status: Former Smoker    Packs/day: 0.75    Years: 5.00    Pack years: 3.75    Types: Cigarettes    Last attempt to quit: 06/04/2013    Years since quitting: 4.7  . Smokeless tobacco: Never Used  Substance Use Topics  . Alcohol use: No    Alcohol/week: 0.0 oz    FAMILY HISTORY:  Family History  Problem Relation Age of Onset  . Diabetes Mother   . Gallbladder disease Mother   . Coronary artery disease Other   . Depression Other   . Cancer Other   . Seizures Other   . Anxiety  disorder Father   . Gallbladder disease Sister   . Diabetes Paternal Uncle        type 2    DRUG ALLERGIES:  Allergies  Allergen Reactions  . Bupropion Hcl     lightheadness  . Cephalexin Itching  . Latex Rash  . Oxycodone-Acetaminophen Anxiety and Rash    Altered Mental Status  . Penicillins Rash  . Promethazine Rash    Tremors  . Venlafaxine Rash and Other (See Comments)    Patient states she is only able to take Desvenlafaxine-Effexor doesn't work. Patient states she is only able to take Desvenlafaxine    REVIEW OF SYSTEMS:   CONSTITUTIONAL: No fever, fatigue or weakness.  EYES: No blurred or double vision.   EARS, NOSE, AND THROAT: No tinnitus or ear pain.  RESPIRATORY: No cough, shortness of breath, wheezing or hemoptysis.  CARDIOVASCULAR: No chest pain, orthopnea, edema.  GASTROINTESTINAL: Positive nausea, vomiting, diarrhea or positive abdominal pain.  GENITOURINARY: No dysuria, hematuria.  ENDOCRINE: No polyuria, nocturia,  HEMATOLOGY: No anemia, easy bruising or bleeding SKIN: No rash or lesion. MUSCULOSKELETAL: No joint pain or arthritis.   NEUROLOGIC: No tingling, numbness, weakness.  PSYCHIATRY: No anxiety or depression.   MEDICATIONS AT HOME:  Prior to Admission medications   Medication Sig Start Date End Date Taking? Authorizing Provider  albuterol (PROVENTIL HFA;VENTOLIN HFA) 108 (90 Base) MCG/ACT inhaler Inhale 2 puffs into the lungs every 6 (six) hours as needed for wheezing or shortness of breath. Patient not taking: Reported on 02/15/2018 06/05/17   Carmon Ginsberg, PA  ARIPiprazole (ABILIFY) 5 MG tablet Take 5 mg by mouth daily. 02/07/18   [provider]  chlorhexidine (PERIDEX) 0.12 % solution 15 mLs by Mouth Rinse route 2 (two) times daily. 04/19/17   [provider]  Denosumab (XGEVA [place]) Inject 1 Dose into the skin. Every 45 days    [provider]  DENTA 5000 PLUS 1.1 % CREA dental cream Place 1 application onto teeth as needed. 04/19/17   [provider]  desvenlafaxine (PRISTIQ) 100 MG 24 hr tablet TAKE 1 TABLET (100 MG TOTAL) BY MOUTH DAILY. 04/09/17   [provider]  fulvestrant (FASLODEX) 250 MG/5ML injection Inject 250 mg into the muscle every 30 (thirty) days. One injection each buttock over 1-2 minutes. Warm prior to use.    [provider]  IBRANCE 125 MG capsule Take 1 capsule by mouth daily. For 21 days 02/05/18   [provider]  Leuprolide Acetate (LUPRON DEPOT, 67-MONTH, IM) Inject 1 Dose into the muscle every 30 (thirty) days.     [provider]  liothyronine (CYTOMEL) 25 MCG tablet Take 25 mcg by  mouth daily. 01/31/18   [provider]  omeprazole (PRILOSEC) 20 MG capsule Take 20 mg by mouth. 03/01/17 03/01/18  [provider]  ondansetron (ZOFRAN) 4 MG tablet Take 4 mg by mouth. 05/13/16   [provider]  ranitidine (ZANTAC) 300 MG tablet Take 1 tablet by mouth daily. 01/30/18   [provider]  traMADol (ULTRAM) 50 MG tablet 1 or 2 tablets every 4 to 6 hours as needed for pain 12/26/16   [provider]      PHYSICAL EXAMINATION:   VITAL SIGNS: Blood pressure (!) 127/91, pulse (!) 110, temperature 99.1 F (37.3 C), resp. rate 17, height 5\' 3"  (1.6 m), weight 220 lb (99.8 kg), SpO2 95 %.  GENERAL:  42 y.o.-year-old patient lying in the bed with no acute distress.  EYES: Pupils  equal, round, reactive to light and accommodation. No scleral icterus. Extraocular muscles intact.  HEENT: Head atraumatic, normocephalic. Oropharynx and nasopharynx clear.  NECK:  Supple, no jugular venous distention. No thyroid enlargement, no tenderness.  LUNGS: Normal breath sounds bilaterally, no wheezing, rales,rhonchi or crepitation. No use of accessory muscles of respiration.  CARDIOVASCULAR: S1, S2 normal. No murmurs, rubs, or gallops.  ABDOMEN: Soft, right upper quadrant tenderness nondistended. Bowel sounds present. No organomegaly or mass.  EXTREMITIES: No pedal edema, cyanosis, or clubbing.  NEUROLOGIC: Cranial nerves II through XII are intact. Muscle strength 5/5 in all extremities. Sensation intact. Gait not checked.  PSYCHIATRIC: The patient is alert and oriented x 3.  SKIN: No obvious rash, lesion, or ulcer.   LABORATORY PANEL:   CBC Recent Labs  Lab 02/20/18 1053  WBC 2.6*  HGB 14.5  HCT 43.3  PLT 99*  MCV 97.1  MCH 32.5  MCHC 33.5  RDW 15.7*   ------------------------------------------------------------------------------------------------------------------  Chemistries  Recent Labs  Lab 02/20/18 1053  NA 134*  K 3.5  CL 105  CO2  20*  GLUCOSE 196*  BUN 9  CREATININE 0.66  CALCIUM 8.3*  AST 167*  ALT 86*  ALKPHOS 246*  BILITOT 2.0*   ------------------------------------------------------------------------------------------------------------------ estimated creatinine clearance is 102.2 mL/min (by C-G formula based on SCr of 0.66 mg/dL). ------------------------------------------------------------------------------------------------------------------ No results for input(s): TSH, T4TOTAL, T3FREE, THYROIDAB in the last 72 hours.  Invalid input(s): FREET3   Coagulation profile No results for input(s): INR, PROTIME in the last 168 hours. ------------------------------------------------------------------------------------------------------------------- No results for input(s): DDIMER in the last 72 hours. -------------------------------------------------------------------------------------------------------------------  Cardiac Enzymes No results for input(s): CKMB, TROPONINI, MYOGLOBIN in the last 168 hours.  Invalid input(s): CK ------------------------------------------------------------------------------------------------------------------ Invalid input(s): POCBNP  ---------------------------------------------------------------------------------------------------------------  Urinalysis    Component Value Date/Time   COLORURINE AMBER (A) 02/20/2018 1053   APPEARANCEUR HAZY (A) 02/20/2018 1053   APPEARANCEUR Clear 12/24/2013 1631   LABSPEC 1.020 02/20/2018 1053   LABSPEC 1.009 12/24/2013 1631   PHURINE 6.0 02/20/2018 1053   GLUCOSEU NEGATIVE 02/20/2018 1053   GLUCOSEU Negative 12/24/2013 1631   HGBUR NEGATIVE 02/20/2018 1053   BILIRUBINUR NEGATIVE 02/20/2018 1053   BILIRUBINUR small 02/15/2018 1056   BILIRUBINUR Negative 12/24/2013 1631   KETONESUR NEGATIVE 02/20/2018 1053   PROTEINUR 30 (A) 02/20/2018 1053   UROBILINOGEN 0.2 02/15/2018 1056   UROBILINOGEN 1.0 02/06/2009 2201   NITRITE  NEGATIVE 02/20/2018 1053   LEUKOCYTESUR NEGATIVE 02/20/2018 1053   LEUKOCYTESUR Negative 12/24/2013 1631     RADIOLOGY: Dg Chest 2 View  Result Date: 02/20/2018 CLINICAL DATA:  Pain and vomiting EXAM: CHEST - 2 VIEW COMPARISON:  December 04, 2012 FINDINGS: Port-A-Cath tip is in the superior vena cava. No pneumothorax. There is no edema or consolidation. The heart size and pulmonary vascularity are normal. No adenopathy. There are surgical clips in the left axillary region with evidence of previous mastectomy on the left. IMPRESSION: No edema or consolidation. No adenopathy. Status post left mastectomy. Port-A-Cath as described without pneumothorax. Electronically Signed   By: Lowella Grip III M.D.   On: 02/20/2018 16:18   US Abdomen Complete  Result Date: 02/20/2018 CLINICAL DATA:  Two weeks of abdominal pain. History of hepatic malignancy and partial hepatectomy. History of breast malignancy. EXAM: ABDOMEN ULTRASOUND COMPLETE COMPARISON:  Abdominal ultrasound of November 05, 2008 and abdominal and pelvic CT scan of May 16, 2017 FINDINGS: Gallbladder: No gallstones or wall thickening visualized. No sonographic Murphy sign noted by sonographer. Common bile duct: Diameter: 5 mm Liver:  The hepatic echotexture is markedly abnormal with numerous rounded masses of varying sizes noted diffusely. The largest mass measures 2.1 cm and is located in the right lobe. There is no intrahepatic ductal dilation. Portal vein is patent on color Doppler imaging with normal direction of blood flow towards the liver. IVC: No abnormality visualized. Pancreas: Bowel gas limits visualization of the pancreas. Spleen: Size and appearance within normal limits. Right Kidney: Length: 11.3 cm. Echogenicity within normal limits. No mass or hydronephrosis visualized. Left Kidney: Length: 11.5 cm. Echogenicity within normal limits. No mass or hydronephrosis visualized. Abdominal aorta: No aneurysm visualized. Other findings: There  is no ascites. IMPRESSION: Markedly abnormal appearance of the liver worrisome for widespread metastatic disease. No ascites or splenomegaly. Hepatic protocol MRI is recommended. No acute abnormality observed elsewhere within the abdomen. Electronically Signed   By: David  Martinique M.D.   On: 02/20/2018 08:36   Ct Abdomen Pelvis W Contrast  Result Date: 02/20/2018 CLINICAL DATA:  Pt to ED via POV c/o right upper abdominal pain x 2 weeks and vomiting x 4 weeks. Pt states that in the last 3 days her symptoms have gotten worse. Pt states that she is unable to get comfortable. Pt denies diarrhea or feversPt has hx of breast ca. US done earlier today stated worrisome for metastatic disease EXAM: CT ABDOMEN AND PELVIS WITH CONTRAST TECHNIQUE: Multidetector CT imaging of the abdomen and pelvis was performed using the standard protocol following bolus administration of intravenous contrast. CONTRAST:  166mL ISOVUE-300 IOPAMIDOL (ISOVUE-300) INJECTION 61% COMPARISON:  Current abdomen ultrasound.  Prior CT, 05/16/2017. FINDINGS: Lower chest: No lung base nodules.  No acute findings. Hepatobiliary: There are numerous hypoattenuating nodules throughout the liver. Largest defined lesions is in the remaining left lobe measuring 19 mm in greatest dimension. Patient has had a resection of a portion of the lateral segment of the left lobe, stable from the prior exam. The liver lesions are all new. Right liver lobe measures 23 cm from superior inferior, previously 22 cm. Gallbladder is unremarkable.  No bile duct dilation. Pancreas: Unremarkable. No pancreatic ductal dilatation or surrounding inflammatory changes. Spleen: Normal in size without focal abnormality. Adrenals/Urinary Tract: Adrenal glands are unremarkable. Kidneys are normal, without renal calculi, focal lesion, or hydronephrosis. Bladder is unremarkable. Stomach/Bowel: Stomach, small bowel and colon are unremarkable. Appendix has been removed. Vascular/Lymphatic: No  pathologically enlarged lymph nodes. Mild aortic atherosclerosis. No aneurysm. Reproductive: Status post hysterectomy. No adnexal masses. Other: No abdominal wall hernia or abnormality. No abdominopelvic ascites. Musculoskeletal: There is an area of sclerosis a mild cortical expansion of the posterolateral left eighth rib, which may reflect an old healed fracture. Could reflect sclerotic metastatic disease. It was not included on the field of view from the prior CT. There is an area of focal sclerosis in the sacrum which is stable consistent with a bone island. No other bone lesions. No acute fracture or acute finding. IMPRESSION: 1. Numerous hypoattenuating liver masses at are new since the prior CT, consistent with widespread liver metastatic disease. 2. Area of sclerosis and cortical thickening of the posterolateral left eighth rib that is incompletely imaged. This could reflect skeletal metastatic disease or be due to an old fracture. It was not covered on the field of view from the prior CT. Area of focal sclerosis in the sacrum is stable from the prior CT most likely a benign bone island. 3. No other evidence of metastatic disease in the abdomen or pelvis. 4. No acute findings in the abdomen  or pelvis. Electronically Signed   By: Lajean Manes M.D.   On: 02/20/2018 17:21    EKG: Orders placed or performed during the hospital encounter of 02/20/18  . ED EKG  . ED EKG    IMPRESSION AND PLAN: Patient is a 42 year old white female with metastatic breast cancer with abdominal pain nausea vomiting  1.  Abdominal pain nausea vomiting to due to new metastatic cancer in the liver Will provide her with pain control and supportive care She also ask oncology to see however her care is provided at [facility] and she should continue to be followed there.  2.  Hyperglycemia check a hemoglobin A1c  3.  Depression continue her home regimen  4.  Miscellaneous Lovenox for DVT prophylaxis   All the records are  reviewed and case discussed with ED provider. Management plans discussed with the patient, family and they are in agreement.  CODE STATUS: Code Status History    Date Active Date Inactive Code Status Order ID Comments User Context   05/16/2017 21:08 05/17/2017 15:49 Full Code [id number]  Jules Husbands, MD Inpatient       TOTAL TIME TAKING CARE OF THIS PATIENT: 30minutes.    Dustin Flock M.D on 02/20/2018 at 6:23 PM  Between 7am to 6pm - Pager - [phone]  After 6pm go to [email or web address] - password [email or web address]  [facility] Office  [phone]  CC: Primary care physician; Birdie Sons, MD

## 2018-02-20 NOTE — Unmapped (Signed)
[facility] Refill Coordination Note  Specialty Medication(s): [facility] 125MG   Diane Gonzalez, DOB: 12-25-1975  Phone: [phone] (home) [phone] (work), Alternate phone contact: N/A  Phone or address changes today?: No  All above HIPAA information was verified with patient.  Shipping Address: [name]   Insurance changes? No    Completed refill call assessment today to schedule patient's medication shipment from the [facility] [phone]).      Confirmed the medication and dosage are correct and have not changed: Yes, regimen is correct and unchanged.    Confirmed patient started or stopped the following medications in the past month:  No, there are no changes reported at this time.    Are you tolerating your medication?:  Diane Gonzalez reports tolerating the medication.    ADHERENCE    (Below is required for Medicare Part B or Transplant patients only - per drug):     Did you miss any doses in the past 4 weeks? No missed doses reported.    FINANCIAL/SHIPPING    Delivery Scheduled: Yes, Expected medication delivery date: 040219     The patient will receive an [facility] print out for each medication shipped and additional FDA Medication Guides as required.  Patient education from [place] or Robet Leu may also be included in the shipment    Diane Gonzalez did not have any additional questions at this time.    Delivery address validated in [facility]SI scheduling system: Yes, address listed in FSI is correct.    We will follow up with patient monthly for standard refill processing and delivery.      Thank you,  Antonietta Barcelona   [facility] Specialty Technician

## 2018-02-21 DIAGNOSIS — Z9221 Personal history of antineoplastic chemotherapy: Secondary | ICD-10-CM

## 2018-02-21 DIAGNOSIS — C779 Secondary and unspecified malignant neoplasm of lymph node, unspecified: Secondary | ICD-10-CM

## 2018-02-21 DIAGNOSIS — R112 Nausea with vomiting, unspecified: Secondary | ICD-10-CM

## 2018-02-21 DIAGNOSIS — R5383 Other fatigue: Secondary | ICD-10-CM

## 2018-02-21 DIAGNOSIS — Z79818 Long term (current) use of other agents affecting estrogen receptors and estrogen levels: Secondary | ICD-10-CM

## 2018-02-21 DIAGNOSIS — Z803 Family history of malignant neoplasm of breast: Secondary | ICD-10-CM

## 2018-02-21 DIAGNOSIS — R109 Unspecified abdominal pain: Secondary | ICD-10-CM

## 2018-02-21 DIAGNOSIS — Z79899 Other long term (current) drug therapy: Secondary | ICD-10-CM

## 2018-02-21 DIAGNOSIS — Z90710 Acquired absence of both cervix and uterus: Secondary | ICD-10-CM

## 2018-02-21 DIAGNOSIS — Z923 Personal history of irradiation: Secondary | ICD-10-CM

## 2018-02-21 DIAGNOSIS — C787 Secondary malignant neoplasm of liver and intrahepatic bile duct: Principal | ICD-10-CM

## 2018-02-21 DIAGNOSIS — Z170 Estrogen receptor positive status [ER+]: Secondary | ICD-10-CM

## 2018-02-21 DIAGNOSIS — C50919 Malignant neoplasm of unspecified site of unspecified female breast: Secondary | ICD-10-CM

## 2018-02-21 DIAGNOSIS — Z87891 Personal history of nicotine dependence: Secondary | ICD-10-CM

## 2018-02-21 LAB — HEMOGLOBIN A1C
Hgb A1c MFr Bld: 5.6 % (ref 4.8–5.6)
MEAN PLASMA GLUCOSE: 114.02 mg/dL

## 2018-02-21 LAB — CBC
HCT: 36.8 % (ref 35.0–47.0)
Hemoglobin: 12.3 g/dL (ref 12.0–16.0)
MCH: 32.5 pg (ref 26.0–34.0)
MCHC: 33.4 g/dL (ref 32.0–36.0)
MCV: 97.4 fL (ref 80.0–100.0)
PLATELETS: 83 10*3/uL — AB (ref 150–440)
RBC: 3.78 MIL/uL — ABNORMAL LOW (ref 3.80–5.20)
RDW: 15.9 % — AB (ref 11.5–14.5)
WBC: 2.3 10*3/uL — ABNORMAL LOW (ref 3.6–11.0)

## 2018-02-21 LAB — BASIC METABOLIC PANEL
Anion gap: 7 (ref 5–15)
BUN: 10 mg/dL (ref 6–20)
CALCIUM: 7.3 mg/dL — AB (ref 8.9–10.3)
CO2: 23 mmol/L (ref 22–32)
CREATININE: 0.75 mg/dL (ref 0.44–1.00)
Chloride: 107 mmol/L (ref 101–111)
GFR calc Af Amer: >60 mL/min (ref >60)
Glucose, Bld: 101 mg/dL — ABNORMAL HIGH (ref 65–99)
Potassium: 3.9 mmol/L (ref 3.5–5.1)
Sodium: 137 mmol/L (ref 135–145)

## 2018-02-21 LAB — GLUCOSE, CAPILLARY
Glucose-Capillary: 91 mg/dL (ref 65–99)
Glucose-Capillary: 89 mg/dL (ref 65–99)

## 2018-02-21 MED ORDER — MORPHINE SULFATE ER 15 MG PO TBCR
15.0000 mg | EXTENDED_RELEASE_TABLET | Freq: Two times a day (BID) | ORAL | Status: DC
  Administered 2018-02-21 – 2018-02-22 (×2): 15 mg via ORAL
  Filled 2018-02-21 (×3): qty 1

## 2018-02-21 MED ORDER — DOCUSATE SODIUM 100 MG PO CAPS
100.0000 mg | ORAL_CAPSULE | Freq: Two times a day (BID) | ORAL | Status: DC
  Administered 2018-02-21: 100 mg via ORAL
  Filled 2018-02-21 (×2): qty 1

## 2018-02-21 MED ORDER — LEVOFLOXACIN 500 MG PO TABS
500.0000 mg | ORAL_TABLET | Freq: Every day | ORAL | Status: DC
  Administered 2018-02-21: 14:00:00 500 mg via ORAL
  Filled 2018-02-21: qty 1

## 2018-02-21 MED ORDER — SODIUM CHLORIDE 0.9% FLUSH
10.0000 mL | INTRAVENOUS | Status: DC | PRN
  Administered 2018-02-22: 08:00:00 10 mL
  Filled 2018-02-21: qty 40

## 2018-02-21 MED ORDER — GUAIFENESIN-DM 100-10 MG/5ML PO SYRP
5.0000 mL | ORAL_SOLUTION | ORAL | Status: DC | PRN
  Administered 2018-02-21 (×3): 5 mL via ORAL
  Filled 2018-02-21 (×4): qty 5

## 2018-02-21 MED ORDER — SENNOSIDES-DOCUSATE SODIUM 8.6-50 MG PO TABS
1.0000 | ORAL_TABLET | Freq: Two times a day (BID) | ORAL | Status: DC
  Administered 2018-02-21 – 2018-02-22 (×3): 1 via ORAL
  Filled 2018-02-21 (×3): qty 1

## 2018-02-21 NOTE — Plan of Care (Signed)
  Progressing Education: Knowledge of General Education information will improve 02/21/2018 0555 - Progressing by Denice Bors, RN Pain Managment: General experience of comfort will improve 02/21/2018 0555 - Progressing by Denice Bors, RN Safety: Ability to remain free from injury will improve 02/21/2018 0555 - Progressing by Denice Bors, RN

## 2018-02-21 NOTE — Consult Note (Signed)
[facility]  Date of admission:  02/20/2018  Inpatient day:  02/21/2018  Consulting physician:  Dr. Dustin Flock   Reason for Consultation:  New mets to liver.  Chief Complaint: Kimberly Key is a 42 y.o. female with metastatic breast cancer who was admitted through the emergency room with abdominal pain and new metastatic disease to the liver.  HPI:  The patient was diagnosed with breast cancer in 2015.  She underwent mastectomy and axillary lymph node dissection 12/31/2013.  Pathology revealed a 4.5 cm grade II invasive ductal carcinoma with 1 of 5 lymph nodes positive (1.3 mm focus).  Underwent a lymph node dissection on 01/10/2014 with 0 of 8 lymph nodes were positive.  Tumor was ER positive, PR positive and HER-2/neu negative. Pathologic stage was [id number] (stage IIB).  She is BRCA1/2 negative.  She received adjuvant Taxotere and Cytoxan x 4 cycles (02/13/2014 - 04/17/2014) followed by adjuvant radiation completing on 06/27/2014.  She then began hormonal therapy.  She had poor tolerance of tamoxifen, Femara, and Arimidex from 07/2014 - 11/2015.  She began Lupron in 01/2015 and Aromasin in 11/2015.   Chest CT on 11/09/2015 revealed possible bony metastasis.  PET scan was equivocal on 11/24/2015, possibly due to a healing fracture.  PET scan on 11/24/2015 showed liver metastasis.  Initial biopsy on 12/03/2015 was consistent with macrovesicular steatosis.  Liver resection on 02/05/2016 confirmed adenocarcinoma consistent with a breast primary.  Tumor was again ER positive, PR positive, and HER-2/neu negative in a background mixed with microvesicular and macrovesicular steatosis.  She began Faslodex and Lupron on 03/14/2016.  PET scan on 01/26/2017 showed mild uptake the hepatic resection site, and felt most likely postsurgical.  Subsequently, she had follow-up PET scans 3 months with the last PET scan in 11/2017.  By report, PET scan had a possible mixed response with paln  for repeat imaging in 02/2018.  She states that she has received Faslodex, Lupron and Xgeva monthly.  Kimberly Homewas added approximately 6 months ago.  She describes last imaging revealing metastatic disease to her spine and sacrum.  She denies any known recent liver lesions until this admission.  She describes a 4-week history of right upper quadrant abdominal pain unrelieved by antacids.  She has also had some nausea and vomiting.  [facility]    Abdominal and pelvic CT scan on 02/20/2018 revealed numerous hypoattenuating liver masses condsistent with widespread liver metastatic disease.  There was an area of sclerosis and cortical thickening of the posterior left eighth rib.  There was a focal area of sclerosis in the sacrum stable from prior CT.  She was admitted for pain control.  She has received IV morphine as needed and is converting to MS Contin.  Current dose of MS Contin is 15 mg every 12 hours.  Pain is well controlled.   Past Medical History:  Diagnosis Date  . Acute appendicitis with localized peritonitis   . Adenocarcinoma of left breast ([facility] 05/04/2016   Kimberly Key [facility], Simple Mastectomy with sentinal node biopsy   . Alteration consciousness 06/14/2016  . Appendicitis 05/16/2017  . Bilateral polycystic ovarian syndrome 05/04/2016  . Bradycardia 05/04/2016  . Breast CA ([facility] 12/11[place]  . Breast lump 11/25/2013  . Cancer ([facility]    Breast Cancer left with lumpectomy  . Chronic headache   . Depression   . History of abnormal cervical Pap smear    09/24/2010- abnormal, HPV Positive, ASCUS  .  History of methicillin resistant Staphylococcus aureus infection 05/04/2016  . Impaired glucose tolerance   . Low back pain 05/13/2016  . Major depressive disorder, recurrent severe without psychotic features ([place]) 05/07/2015  . Mild dysplasia of cervix   . Nausea 05/13/2016  . Other acute appendicitis   . Panic attack as reaction to stress 05/04/2016  .  Pre-syncope 05/13/2016  . Recurrent major depressive disorder, in partial remission ([place]) 05/07/2015  . Steatohepatitis   . Tinea versicolor 05/04/2016  . Urinary tract infection 08/14/2015    Past Surgical History:  Procedure Laterality Date  . 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 LAPAROSCOPIC APPENDECTOMY N/A 05/16/2017   Procedure: APPENDECTOMY LAPAROSCOPIC;  Surgeon: Kimberly Husbands, MD;  Location: [facility] ORS;  Service: General;  Laterality: N/A;  . MASTECTOMY Right 12/31/2013   Kimberly Key, Simple mastectomy with sentinal node biopsy    Family History  Problem Relation Age of Onset  . Diabetes Mother   . Gallbladder disease Mother   . Coronary artery disease Other   . Depression Other   . Cancer Other   . Seizures Other   . Anxiety disorder Father   . Gallbladder disease Sister   . Diabetes Paternal Uncle        type 2   Her maternal aunt had breast cancer at age 37 and another maternal aunt had "female cancer" at age 69.   Social History:  reports that she quit smoking about 4 years ago. Her smoking use included cigarettes. She has a 3.75 pack-year smoking history. she has never used smokeless tobacco. She reports that she does not drink alcohol or use drugs.  She lives in [place] and has been disabled since her diagnosis of cancer.  She has an 41 year old son disabled and 81 year old daughter.  She is a Restaurant manager, fast food.  She is accompanied by her mother today.  Allergies:  Allergies  Allergen Reactions  . Bupropion Hcl     lightheadness  . Cephalexin Itching  . Latex Rash  . Oxycodone-Acetaminophen Anxiety and Rash    Altered Mental Status  . Penicillins Rash  . Promethazine Rash    Tremors  . Venlafaxine Rash and Other (See Comments)    Patient states she is only able to take Desvenlafaxine-Effexor doesn't work. Patient states she is only able to take Desvenlafaxine    Medications Prior to Admission  Medication Sig Dispense  Refill  . ARIPiprazole (ABILIFY) 5 MG tablet Take 5 mg by mouth daily.  1  . Denosumab (XGEVA [place]) Inject 1 Dose into the skin. Every 45 days    . DENTA 5000 PLUS 1.1 % CREA dental cream Place 1 application onto teeth as needed.  3  . desvenlafaxine (PRISTIQ) 100 MG 24 hr tablet TAKE 1 TABLET (100 MG TOTAL) BY MOUTH DAILY.  5  . fulvestrant (FASLODEX) 250 MG/5ML injection Inject 250 mg into the muscle every 30 (thirty) days. One injection each buttock over 1-2 minutes. Warm prior to use.    Leslee Key 125 MG capsule Take 1 capsule by mouth daily. For 21 days    . Leuprolide Acetate (LUPRON DEPOT, 51-MONTH, IM) Inject 1 Dose into the muscle every 30 (thirty) days.     Marland Kitchen liothyronine (CYTOMEL) 25 MCG tablet Take 25 mcg by mouth daily.  1  . omeprazole (PRILOSEC) 20 MG capsule Take 20 mg by mouth daily.     . ondansetron (ZOFRAN) 4 MG tablet Take  4 mg by mouth every 8 (eight) hours as needed.     . ranitidine (ZANTAC) 300 MG tablet Take 1 tablet by mouth at bedtime.   3  . traMADol (ULTRAM) 50 MG tablet 1 or 2 tablets every 4 to 6 hours as needed for pain    . albuterol (PROVENTIL HFA;VENTOLIN HFA) 108 (90 Base) MCG/ACT inhaler Inhale 2 puffs into the lungs every 6 (six) hours as needed for wheezing or shortness of breath. (Patient not taking: Reported on 02/15/2018) 1 Inhaler 2    Review of Systems: GENERAL:  Fatigue.  No fevers or sweats.  No weight loss. PERFORMANCE STATUS (ECOG):  2 HEENT:  No visual changes, runny nose, sore throat, mouth sores or tenderness. Lungs: No shortness of breath.  Cough.  No hemoptysis. Cardiac:  No chest pain, palpitations, orthopnea, or PND. GI:  RUQ abdominal pain, improved.  Nausea and vomiting x 4 weeks.  No diarrhea, constipation, melena or hematochezia. GU:  No urgency, frequency, dysuria, or hematuria. Musculoskeletal:  No back pain.  No joint pain.  No muscle tenderness. Extremities:  No pain or swelling. Skin:  No rashes or skin changes. Neuro:  No  headache, numbness or weakness, balance or coordination issues. Endocrine:  No diabetes, thyroid issues, hot flashes or night sweats. Psych:  No mood changes, depression or anxiety. Pain:  RUQ pain. Review of systems:  All other systems reviewed and found to be negative.  Physical Exam:  Blood pressure 116/60, pulse 88, temperature 98.2 F (36.8 C), temperature source Oral, resp. rate 18, height 5' 3"  (1.6 m), weight 219 lb 3.2 oz (99.4 kg), SpO2 96 %.  GENERAL:  Well developed, well nourished,heacyset woman lying comfortably on the medical unit in no acute distress. MENTAL STATUS:  Alert and oriented to person, place and time. HEAD:  Long brown hair.  Normocephalic, atraumatic, face symmetric, no Cushingoid features. EYES:  Glasses.  Blue eyes.  Pupils equal round and reactive to light and accomodation.  No conjunctivitis or scleral icterus. ENT:  Oropharynx clear without lesion.  Tongue normal. Mucous membranes moist.  RESPIRATORY:  Clear to auscultation without rales, wheezes or rhonchi. CARDIOVASCULAR:  Regular rate and rhythm without murmur, rub or gallop. ABDOMEN:  Fully round.  Soft, slightly tender RUQ without guarding or rebound tenderness.  Active bowel sounds and no appreciable hepatosplenomegaly.  No masses.  No shifting dullness or fluid wave. SKIN:  No rashes, ulcers or lesions. EXTREMITIES: No edema, no skin discoloration or tenderness.  No palpable cords. LYMPH NODES: No palpable cervical, supraclavicular, axillary or inguinal adenopathy  NEUROLOGICAL: Unremarkable. PSYCH:  Appropriate.   Results for orders placed or performed during the hospital encounter of 02/20/18 (from the past 48 hour(s))  Lipase, blood     Status: None   Collection Time: 02/20/18 10:53 AM  Result Value Ref Range   Lipase 38 11 - 51 U/L    Comment: Performed at [facility], [address] [place], [place] [address]  Comprehensive metabolic panel     Status: Abnormal   Collection Time:  02/20/18 10:53 AM  Result Value Ref Range   Sodium 134 (L) 135 - 145 mmol/L   Potassium 3.5 3.5 - 5.1 mmol/L   Chloride 105 101 - 111 mmol/L   CO2 20 (L) 22 - 32 mmol/L   Glucose, Bld 196 (H) 65 - 99 mg/dL   BUN 9 6 - 20 mg/dL   Creatinine, Ser 0.66 0.44 - 1.00 mg/dL   Calcium 8.3 (L)  8.9 - 10.3 mg/dL   Total Protein 6.4 (L) 6.5 - 8.1 g/dL   Albumin 2.8 (L) 3.5 - 5.0 g/dL   AST 167 (H) 15 - 41 U/L   ALT 86 (H) 14 - 54 U/L   Alkaline Phosphatase 246 (H) 38 - 126 U/L   Total Bilirubin 2.0 (H) 0.3 - 1.2 mg/dL   GFR calc non Af Amer >60 >60 mL/min   GFR calc Af Amer >60 >60 mL/min    Comment: (NOTE) The eGFR has been calculated using the CKD EPI equation. This calculation has not been validated in all clinical situations. eGFR's persistently <60 mL/min signify possible Chronic Kidney Disease.    Anion gap 9 5 - 15    Comment: Performed at [facility], [address] [place], [place] [address]  CBC     Status: Abnormal   Collection Time: 02/20/18 10:53 AM  Result Value Ref Range   WBC 2.6 (L) 3.6 - 11.0 K/uL   RBC 4.46 3.80 - 5.20 MIL/uL   Hemoglobin 14.5 12.0 - 16.0 g/dL   HCT 43.3 35.0 - 47.0 %   MCV 97.1 80.0 - 100.0 fL   MCH 32.5 26.0 - 34.0 pg   MCHC 33.5 32.0 - 36.0 g/dL   RDW 15.7 (H) 11.5 - 14.5 %   Platelets 99 (L) 150 - 440 K/uL    Comment: Performed at [facility], [address] [place], [place] [address]  Urinalysis, Complete w Microscopic     Status: Abnormal   Collection Time: 02/20/18 10:53 AM  Result Value Ref Range   Color, Urine AMBER (A) YELLOW    Comment: BIOCHEMICALS MAY BE AFFECTED BY COLOR   APPearance HAZY (A) CLEAR   Specific Gravity, Urine 1.020 1.005 - 1.030   pH 6.0 5.0 - 8.0   Glucose, UA NEGATIVE NEGATIVE mg/dL   Hgb urine dipstick NEGATIVE NEGATIVE   Bilirubin Urine NEGATIVE NEGATIVE   Ketones, ur NEGATIVE NEGATIVE mg/dL   Protein, ur 30 (A) NEGATIVE mg/dL   Nitrite NEGATIVE NEGATIVE   Leukocytes, UA NEGATIVE  NEGATIVE   RBC / HPF 0-5 0 - 5 RBC/hpf   WBC, UA 0-5 0 - 5 WBC/hpf   Bacteria, UA RARE (A) NONE SEEN   Squamous Epithelial / LPF 0-5 (A) NONE SEEN   Mucus PRESENT    Hyaline Casts, UA PRESENT     Comment: Performed at [facility], [address] [place], [place] [address]  Glucose, capillary     Status: Abnormal   Collection Time: 02/20/18  9:17 PM  Result Value Ref Range   Glucose-Capillary 152 (H) 65 - 99 mg/dL  CBC     Status: Abnormal   Collection Time: 02/21/18  3:31 AM  Result Value Ref Range   WBC 2.3 (L) 3.6 - 11.0 K/uL   RBC 3.78 (L) 3.80 - 5.20 MIL/uL   Hemoglobin 12.3 12.0 - 16.0 g/dL   HCT 36.8 35.0 - 47.0 %   MCV 97.4 80.0 - 100.0 fL   MCH 32.5 26.0 - 34.0 pg   MCHC 33.4 32.0 - 36.0 g/dL   RDW 15.9 (H) 11.5 - 14.5 %   Platelets 83 (L) 150 - 440 K/uL    Comment: Performed at [facility], [address] [place], [place] [address]  Basic metabolic panel     Status: Abnormal   Collection Time: 02/21/18  3:31 AM  Result Value Ref Range   Sodium 137 135 - 145 mmol/L  Potassium 3.9 3.5 - 5.1 mmol/L   Chloride 107 101 - 111 mmol/L   CO2 23 22 - 32 mmol/L   Glucose, Bld 101 (H) 65 - 99 mg/dL   BUN 10 6 - 20 mg/dL   Creatinine, Ser 0.75 0.44 - 1.00 mg/dL   Calcium 7.3 (L) 8.9 - 10.3 mg/dL   GFR calc non Af Amer >60 >60 mL/min   GFR calc Af Amer >60 >60 mL/min    Comment: (NOTE) The eGFR has been calculated using the CKD EPI equation. This calculation has not been validated in all clinical situations. eGFR's persistently <60 mL/min signify possible Chronic Kidney Disease.    Anion gap 7 5 - 15    Comment: Performed at [facility], [address] [place], [place] [address]  Glucose, capillary     Status: None   Collection Time: 02/21/18  7:37 AM  Result Value Ref Range   Glucose-Capillary 91 65 - 99 mg/dL  Glucose, capillary     Status: None   Collection Time: 02/21/18 11:53 AM  Result Value Ref Range   Glucose-Capillary 89 65  - 99 mg/dL   Dg Chest 2 View  Result Date: 02/20/2018 CLINICAL DATA:  Pain and vomiting EXAM: CHEST - 2 VIEW COMPARISON:  December 04, 2012 FINDINGS: Port-A-Cath tip is in the superior vena cava. No pneumothorax. There is no edema or consolidation. The heart size and pulmonary vascularity are normal. No adenopathy. There are surgical clips in the left axillary region with evidence of previous mastectomy on the left. IMPRESSION: No edema or consolidation. No adenopathy. Status post left mastectomy. Port-A-Cath as described without pneumothorax. Electronically Signed   By: Lowella Grip III M.D.   On: 02/20/2018 16:18   US Abdomen Complete  Result Date: 02/20/2018 CLINICAL DATA:  Two weeks of abdominal pain. History of hepatic malignancy and partial hepatectomy. History of breast malignancy. EXAM: ABDOMEN ULTRASOUND COMPLETE COMPARISON:  Abdominal ultrasound of November 05, 2008 and abdominal and pelvic CT scan of May 16, 2017 FINDINGS: Gallbladder: No gallstones or wall thickening visualized. No sonographic Murphy sign noted by sonographer. Common bile duct: Diameter: 5 mm Liver: The hepatic echotexture is markedly abnormal with numerous rounded masses of varying sizes noted diffusely. The largest mass measures 2.1 cm and is located in the right lobe. There is no intrahepatic ductal dilation. Portal vein is patent on color Doppler imaging with normal direction of blood flow towards the liver. IVC: No abnormality visualized. Pancreas: Bowel gas limits visualization of the pancreas. Spleen: Size and appearance within normal limits. Right Kidney: Length: 11.3 cm. Echogenicity within normal limits. No mass or hydronephrosis visualized. Left Kidney: Length: 11.5 cm. Echogenicity within normal limits. No mass or hydronephrosis visualized. Abdominal aorta: No aneurysm visualized. Other findings: There is no ascites. IMPRESSION: Markedly abnormal appearance of the liver worrisome for widespread metastatic  disease. No ascites or splenomegaly. Hepatic protocol MRI is recommended. No acute abnormality observed elsewhere within the abdomen. Electronically Signed   By: David  Martinique M.D.   On: 02/20/2018 08:36   Ct Abdomen Pelvis W Contrast  Result Date: 02/20/2018 CLINICAL DATA:  Pt to ED via POV c/o right upper abdominal pain x 2 weeks and vomiting x 4 weeks. Pt states that in the last 3 days her symptoms have gotten worse. Pt states that she is unable to get comfortable. Pt denies diarrhea or feversPt has hx of breast ca. US done earlier today stated worrisome for metastatic disease EXAM: CT ABDOMEN AND PELVIS  WITH CONTRAST TECHNIQUE: Multidetector CT imaging of the abdomen and pelvis was performed using the standard protocol following bolus administration of intravenous contrast. CONTRAST:  188m ISOVUE-300 IOPAMIDOL (ISOVUE-300) INJECTION 61% COMPARISON:  Current abdomen ultrasound.  Prior CT, 05/16/2017. FINDINGS: Lower chest: No lung base nodules.  No acute findings. Hepatobiliary: There are numerous hypoattenuating nodules throughout the liver. Largest defined lesions is in the remaining left lobe measuring 19 mm in greatest dimension. Patient has had a resection of a portion of the lateral segment of the left lobe, stable from the prior exam. The liver lesions are all new. Right liver lobe measures 23 cm from superior inferior, previously 22 cm. Gallbladder is unremarkable.  No bile duct dilation. Pancreas: Unremarkable. No pancreatic ductal dilatation or surrounding inflammatory changes. Spleen: Normal in size without focal abnormality. Adrenals/Urinary Tract: Adrenal glands are unremarkable. Kidneys are normal, without renal calculi, focal lesion, or hydronephrosis. Bladder is unremarkable. Stomach/Bowel: Stomach, small bowel and colon are unremarkable. Appendix has been removed. Vascular/Lymphatic: No pathologically enlarged lymph nodes. Mild aortic atherosclerosis. No aneurysm. Reproductive: Status post  hysterectomy. No adnexal masses. Other: No abdominal wall hernia or abnormality. No abdominopelvic ascites. Musculoskeletal: There is an area of sclerosis a mild cortical expansion of the posterolateral left eighth rib, which may reflect an old healed fracture. Could reflect sclerotic metastatic disease. It was not included on the field of view from the prior CT. There is an area of focal sclerosis in the sacrum which is stable consistent with a bone island. No other bone lesions. No acute fracture or acute finding. IMPRESSION: 1. Numerous hypoattenuating liver masses at are new since the prior CT, consistent with widespread liver metastatic disease. 2. Area of sclerosis and cortical thickening of the posterolateral left eighth rib that is incompletely imaged. This could reflect skeletal metastatic disease or be due to an old fracture. It was not covered on the field of view from the prior CT. Area of focal sclerosis in the sacrum is stable from the prior CT most likely a benign bone island. 3. No other evidence of metastatic disease in the abdomen or pelvis. 4. No acute findings in the abdomen or pelvis. Electronically Signed   By: DLajean ManesM.D.   On: 02/20/2018 17:21    Assessment:  The patient is a 42y.o. woman with metastatic breast cancer on monthly Faslodex, Lupron ans Xgeva.  She receives Ibrance 3 weeks on/1 week off.  She presented with a 4 week history of RUQ abdominal pain, nausea and vomiting.  Abdominal and pelvic CT on 02/20/2018 revealed numerous hypoattenuating liver masses condsistent with widespread liver metastatic disease.  She is a JRestaurant manager, fast food  Symptomatically, her pain is controlled with MS Contin and prn morphine.  Plan:   1.  Oncology:  [facility] with known metastatic breast cancer [facility]  Last scan apparently revealed a questionable mixed response.  We discussed likely discontinuation of her hormonal therapy and initiation of chemotherapy.   We discussed numerous single agent options including Xeloda (oral) and Halaven (IV).  Side effects were reviewed.  Check CA27.29 and CA15-3.  [facility]  I spoke with him today.  2.  Pain and Toxicology:  Pain controlled on MS Contin.  She is requiring some morphine IV prn.  3.  Disposition:  Anticipate discharge to the outpatient department tomorrow.  Thank you for allowing me to participate in SJALYSSA FLEISHER's care.  I will follow her  closely with you while hospitalized.  After discharge, I will coordinate care with Dr. Lonia Chimera in the outpatient department.   Lequita Asal, MD  02/21/2018, 5:30 PM

## 2018-02-21 NOTE — Progress Notes (Signed)
[facility] [place] NAME: Kimberly Key    MR#:  [id number]  DATE OF BIRTH:  Jun 13, 1976  SUBJECTIVE:  CHIEF COMPLAINT:   Chief Complaint  Patient presents with  . Emesis  . Abdominal Pain    came with abd pain, have new mets in liver.   Still requires IV morphin.   Able to tolerate diet.  REVIEW OF SYSTEMS:  CONSTITUTIONAL: No fever, fatigue or weakness.  EYES: No blurred or double vision.  EARS, NOSE, AND THROAT: No tinnitus or ear pain.  RESPIRATORY: No cough, shortness of breath, wheezing or hemoptysis.  CARDIOVASCULAR: No chest pain, orthopnea, edema.  GASTROINTESTINAL: No nausea, vomiting, diarrhea , have abdominal pain.  GENITOURINARY: No dysuria, hematuria.  ENDOCRINE: No polyuria, nocturia,  HEMATOLOGY: No anemia, easy bruising or bleeding SKIN: No rash or lesion. MUSCULOSKELETAL: No joint pain or arthritis.   NEUROLOGIC: No tingling, numbness, weakness.  PSYCHIATRY: No anxiety or depression.   ROS  DRUG ALLERGIES:   Allergies  Allergen Reactions  . Bupropion Hcl     lightheadness  . Cephalexin Itching  . Latex Rash  . Oxycodone-Acetaminophen Anxiety and Rash    Altered Mental Status  . Penicillins Rash  . Promethazine Rash    Tremors  . Venlafaxine Rash and Other (See Comments)    Patient states she is only able to take Desvenlafaxine-Effexor doesn't work. Patient states she is only able to take Desvenlafaxine    VITALS:  Blood pressure 116/60, pulse 88, temperature 98.2 F (36.8 C), temperature source Oral, resp. rate 18, height 5\' 3"  (1.6 m), weight 99.4 kg (219 lb 3.2 oz), SpO2 96 %.  PHYSICAL EXAMINATION:  GENERAL:  42 y.o.-year-old patient lying in the bed with no acute distress.  EYES: Pupils equal, round, reactive to light and accommodation. No scleral icterus. Extraocular muscles intact.  HEENT: Head atraumatic, normocephalic. Oropharynx and nasopharynx clear.  NECK:  Supple, no jugular venous  distention. No thyroid enlargement, no tenderness.  LUNGS: Normal breath sounds bilaterally, no wheezing, rales,rhonchi or crepitation. No use of accessory muscles of respiration.  CARDIOVASCULAR: S1, S2 normal. No murmurs, rubs, or gallops.  ABDOMEN: Soft, tender, nondistended. Bowel sounds present. No organomegaly or mass.  EXTREMITIES: No pedal edema, cyanosis, or clubbing.  NEUROLOGIC: Cranial nerves II through XII are intact. Muscle strength 5/5 in all extremities. Sensation intact. Gait not checked.  PSYCHIATRIC: The patient is alert and oriented x 3.  SKIN: No obvious rash, lesion, or ulcer.   Physical Exam LABORATORY PANEL:   CBC Recent Labs  Lab 02/21/18 0331  WBC 2.3*  HGB 12.3  HCT 36.8  PLT 83*   ------------------------------------------------------------------------------------------------------------------  Chemistries  Recent Labs  Lab 02/20/18 1053 02/21/18 0331  NA 134* 137  K 3.5 3.9  CL 105 107  CO2 20* 23  GLUCOSE 196* 101*  BUN 9 10  CREATININE 0.66 0.75  CALCIUM 8.3* 7.3*  AST 167*  --   ALT 86*  --   ALKPHOS 246*  --   BILITOT 2.0*  --    ------------------------------------------------------------------------------------------------------------------  Cardiac Enzymes No results for input(s): TROPONINI in the last 168 hours. ------------------------------------------------------------------------------------------------------------------  RADIOLOGY:  Dg Chest 2 View  Result Date: 02/20/2018 CLINICAL DATA:  Pain and vomiting EXAM: CHEST - 2 VIEW COMPARISON:  December 04, 2012 FINDINGS: Port-A-Cath tip is in the superior vena cava. No pneumothorax. There is no edema or consolidation. The heart size and pulmonary vascularity are normal. No adenopathy. There are surgical  clips in the left axillary region with evidence of previous mastectomy on the left. IMPRESSION: No edema or consolidation. No adenopathy. Status post left mastectomy. Port-A-Cath  as described without pneumothorax. Electronically Signed   By: Lowella Grip III M.D.   On: 02/20/2018 16:18   US Abdomen Complete  Result Date: 02/20/2018 CLINICAL DATA:  Two weeks of abdominal pain. History of hepatic malignancy and partial hepatectomy. History of breast malignancy. EXAM: ABDOMEN ULTRASOUND COMPLETE COMPARISON:  Abdominal ultrasound of November 05, 2008 and abdominal and pelvic CT scan of May 16, 2017 FINDINGS: Gallbladder: No gallstones or wall thickening visualized. No sonographic Murphy sign noted by sonographer. Common bile duct: Diameter: 5 mm Liver: The hepatic echotexture is markedly abnormal with numerous rounded masses of varying sizes noted diffusely. The largest mass measures 2.1 cm and is located in the right lobe. There is no intrahepatic ductal dilation. Portal vein is patent on color Doppler imaging with normal direction of blood flow towards the liver. IVC: No abnormality visualized. Pancreas: Bowel gas limits visualization of the pancreas. Spleen: Size and appearance within normal limits. Right Kidney: Length: 11.3 cm. Echogenicity within normal limits. No mass or hydronephrosis visualized. Left Kidney: Length: 11.5 cm. Echogenicity within normal limits. No mass or hydronephrosis visualized. Abdominal aorta: No aneurysm visualized. Other findings: There is no ascites. IMPRESSION: Markedly abnormal appearance of the liver worrisome for widespread metastatic disease. No ascites or splenomegaly. Hepatic protocol MRI is recommended. No acute abnormality observed elsewhere within the abdomen. Electronically Signed   By: David  Martinique M.D.   On: 02/20/2018 08:36   Ct Abdomen Pelvis W Contrast  Result Date: 02/20/2018 CLINICAL DATA:  Pt to ED via POV c/o right upper abdominal pain x 2 weeks and vomiting x 4 weeks. Pt states that in the last 3 days her symptoms have gotten worse. Pt states that she is unable to get comfortable. Pt denies diarrhea or feversPt has hx of breast  ca. US done earlier today stated worrisome for metastatic disease EXAM: CT ABDOMEN AND PELVIS WITH CONTRAST TECHNIQUE: Multidetector CT imaging of the abdomen and pelvis was performed using the standard protocol following bolus administration of intravenous contrast. CONTRAST:  171mL ISOVUE-300 IOPAMIDOL (ISOVUE-300) INJECTION 61% COMPARISON:  Current abdomen ultrasound.  Prior CT, 05/16/2017. FINDINGS: Lower chest: No lung base nodules.  No acute findings. Hepatobiliary: There are numerous hypoattenuating nodules throughout the liver. Largest defined lesions is in the remaining left lobe measuring 19 mm in greatest dimension. Patient has had a resection of a portion of the lateral segment of the left lobe, stable from the prior exam. The liver lesions are all new. Right liver lobe measures 23 cm from superior inferior, previously 22 cm. Gallbladder is unremarkable.  No bile duct dilation. Pancreas: Unremarkable. No pancreatic ductal dilatation or surrounding inflammatory changes. Spleen: Normal in size without focal abnormality. Adrenals/Urinary Tract: Adrenal glands are unremarkable. Kidneys are normal, without renal calculi, focal lesion, or hydronephrosis. Bladder is unremarkable. Stomach/Bowel: Stomach, small bowel and colon are unremarkable. Appendix has been removed. Vascular/Lymphatic: No pathologically enlarged lymph nodes. Mild aortic atherosclerosis. No aneurysm. Reproductive: Status post hysterectomy. No adnexal masses. Other: No abdominal wall hernia or abnormality. No abdominopelvic ascites. Musculoskeletal: There is an area of sclerosis a mild cortical expansion of the posterolateral left eighth rib, which may reflect an old healed fracture. Could reflect sclerotic metastatic disease. It was not included on the field of view from the prior CT. There is an area of focal sclerosis in the sacrum which  is stable consistent with a bone island. No other bone lesions. No acute fracture or acute finding.  IMPRESSION: 1. Numerous hypoattenuating liver masses at are new since the prior CT, consistent with widespread liver metastatic disease. 2. Area of sclerosis and cortical thickening of the posterolateral left eighth rib that is incompletely imaged. This could reflect skeletal metastatic disease or be due to an old fracture. It was not covered on the field of view from the prior CT. Area of focal sclerosis in the sacrum is stable from the prior CT most likely a benign bone island. 3. No other evidence of metastatic disease in the abdomen or pelvis. 4. No acute findings in the abdomen or pelvis. Electronically Signed   By: Lajean Manes M.D.   On: 02/20/2018 17:21    ASSESSMENT AND PLAN:   Active Problems:   Chest pain   Abdominal pain  Patient is a 42 year old white female with metastatic breast cancer with abdominal pain nausea vomiting  1.  Abdominal pain nausea vomiting to due to new metastatic cancer in the liver provide her with pain control and supportive care Added oral MS contin.  Oncology to see her.  2.  Hyperglycemia check a hemoglobin A1c  3.  Depression continue her home regimen  4.  Miscellaneous Lovenox for DVT prophylaxis  5. Cough- for last 2-3 weeks, acute bronchitis   Levaquin oral for 3 days.    All the records are reviewed and case discussed with Care Management/Social Workerr. Management plans discussed with the patient, family and they are in agreement.  CODE STATUS: Full.  TOTAL TIME TAKING CARE OF THIS PATIENT: 35 minutes.     POSSIBLE D/C IN 1-2 DAYS, DEPENDING ON CLINICAL CONDITION.   Vaughan Basta M.D on 02/21/2018   Between 7am to 6pm - Pager - [phone]  After 6pm go to [email or web address] - password [email or web address] [facility]  Office  [phone]  CC: Primary care physician; Birdie Sons, MD  Note: This dictation was prepared with Dragon dictation along with smaller phrase technology. Any transcriptional errors  that result from this process are unintentional.

## 2018-02-21 NOTE — Progress Notes (Signed)
PHARMACIST PAIN CONSULT   Kimberly Key is an 42 y.o. female who presented to [facility] on 02/20/2018 with a chief complaint of emesis and abdominal pain for the past 4 weeks. She presents with a known history of breast cancer and is followed at [facility].  She was recently seen by her PCP who noted the presence of metastatic liver disease on her ultrasound.   Past Medical History:  Diagnosis Date  . Acute appendicitis with localized peritonitis   . Adenocarcinoma of left breast ([place]) 05/04/2016   [facility], Simple Mastectomy with sentinal node biopsy   . Alteration consciousness 06/14/2016  . Appendicitis 05/16/2017  . Bilateral polycystic ovarian syndrome 05/04/2016  . Bradycardia 05/04/2016  . Breast CA ([place]) 12/11/2013  . Breast lump 11/25/2013  . Cancer [facility])    Breast Cancer left with lumpectomy  . Chronic headache   . Depression   . History of abnormal cervical Pap smear    09/24/2010- abnormal, HPV Positive, ASCUS  . History of methicillin resistant Staphylococcus aureus infection 05/04/2016  . Impaired glucose tolerance   . Low back pain 05/13/2016  . Major depressive disorder, recurrent severe without psychotic features ([place]) 05/07/2015  . Mild dysplasia of cervix   . Nausea 05/13/2016  . Other acute appendicitis   . Panic attack as reaction to stress 05/04/2016  . Pre-syncope 05/13/2016  . Recurrent major depressive disorder, in partial remission ([place]) 05/07/2015  . Steatohepatitis   . Tinea versicolor 05/04/2016  . Urinary tract infection 08/14/2015    Cancer Diagnosis: metastatic breast cancer - liver mets  Opioid Naive: Yes - patient has an outpatient prescription for tramadol but she was unable to take it due to constipation.  Home Pain Medications: tramadol 1-2 tabs Q4-6H PRN  Home Morphine Equivalents/24 hours: Zero. The patient has a prescription at home for tramadol but states that she has not been using it due to constipation.   Baseline Pain Score at Home: 9 for the  past 4 weeks. It was 0 prior to 4 weeks ago.   Assessment:   [name] is a 42 yo female admitted with severe emesis and abdominal pain for 4 weeks. She states that she had an outpatient prescription for tramadol, but that she had not been using it due to constipation. Upon admittance, patient's pain scores were 7. She has been treated with IV morphine 4mg  PRN which she has needed around the clock to achieve a pain score of 0-3. Patient's outpatient pain regimen will need to be adjusted.   Date Pain Score 3/19 7 3/20 3  LBM: 3/19  Pain Goal in Hospital: 0  Plan:   Scheduled: Morphine SR 15mg  BID Mild: APAP 650 mg Q6H PRN PO or Rectal Moderate: Tramadol 100 mg Q6H PRN Severe: Morphine 4 mg IV Q4H PRN  Constipation: Docusate/senna 1 tab BID    After discussion with MD, will add scheduled morphine SR 15 mg BID. Will also change scheduled docusate to scheduled  docusate/senna daily per patient request. When patient is discharged will need an immediate release morphine for breakthrough pain.   Pharmacist will continue to monitor patient's pain scores and bowel movements and adjust therapies as needed.   Lendon Ka, PharmD Pharmacy Resident 02/21/2018 1:32 PM

## 2018-02-22 ENCOUNTER — Encounter: Admit: 2018-02-22 | Discharge: 2018-02-23 | Payer: MEDICARE

## 2018-02-22 LAB — GLUCOSE, CAPILLARY
GLUCOSE-CAPILLARY: 114 mg/dL — AB (ref 65–99)
GLUCOSE-CAPILLARY: 113 mg/dL — AB (ref 65–99)

## 2018-02-22 MED ORDER — POLYETHYLENE GLYCOL 3350 17 G PO PACK
17.0000 g | PACK | Freq: Every day | ORAL | Status: DC
  Administered 2018-02-22: 12:00:00 17 g via ORAL
  Filled 2018-02-22: qty 1

## 2018-02-22 MED ORDER — POLYETHYLENE GLYCOL 3350 17 G PO PACK: 17.0000 g | PACK | Freq: Every day | ORAL | 0 refills | Status: AC

## 2018-02-22 MED ORDER — LEVOFLOXACIN 500 MG PO TABS: 500.0000 mg | ORAL_TABLET | Freq: Every day | ORAL | 0 refills | Status: AC

## 2018-02-22 MED ORDER — GUAIFENESIN-DM 100-10 MG/5ML PO SYRP: 5.0000 mL | ORAL_SOLUTION | ORAL | 0 refills | Status: AC | PRN

## 2018-02-22 MED ORDER — SENNOSIDES-DOCUSATE SODIUM 8.6-50 MG PO TABS: 1.0000 | ORAL_TABLET | Freq: Two times a day (BID) | ORAL | 0 refills | Status: AC

## 2018-02-22 MED ORDER — HEPARIN SOD (PORK) LOCK FLUSH 100 UNIT/ML IV SOLN
500.0000 [IU] | Freq: Once | INTRAVENOUS | Status: AC
  Administered 2018-02-22: 500 [IU] via INTRAVENOUS
  Filled 2018-02-22: qty 5

## 2018-02-22 MED ORDER — MORPHINE SULFATE ER 15 MG PO TBCR: 15.0000 mg | EXTENDED_RELEASE_TABLET | Freq: Two times a day (BID) | ORAL | 0 refills | Status: AC

## 2018-02-22 NOTE — Care Management Important Message (Signed)
Important Message  Patient Details  Name: Kimberly Key MRN: [id number] Date of Birth: [birth year]   Medicare Important Message Given:  N/A - LOS <3 / Initial given by admissions    Katrina Stack, RN 02/22/2018, 3:30 PM

## 2018-02-22 NOTE — Progress Notes (Signed)
Home medication returned to patient. Madlyn Frankel, RN

## 2018-02-22 NOTE — Discharge Instructions (Signed)
Follow with your cancer center at [facility] as you already have appointment.

## 2018-02-22 NOTE — Progress Notes (Signed)
Received Md order to discharge patient to home reviewed home meds discharge instructions prescriptions and follow up appointments with patient and patient verbalized understanding discharged to home on home o2

## 2018-02-22 NOTE — Unmapped (Signed)
The patient reported to the emergency room in [facility] hospital with increasing abdominal pain.  CT scan shows multiple liver metastasis.  Dr. Merlene Pulling called as she was not able to pull up the patient's records on Care Everywhere in epic.  The patient's case was discussed.  The patient will be discharged from the hospital likely the next day or 2.  She is scheduled for follow-up PET/CT March 26, and we will plan to have that study as well as being certain to have the outside CT scan transferred into PACS.

## 2018-02-23 ENCOUNTER — Telehealth: Payer: Self-pay | Admitting: Urgent Care

## 2018-02-23 LAB — CANCER ANTIGEN 15-3: CA 15-3: 1470 U/mL — ABNORMAL HIGH (ref 0.0–25.0)

## 2018-02-23 LAB — CANCER ANTIGEN 27.29: CA 27.29: 5290.6 U/mL — ABNORMAL HIGH (ref 0.0–38.6)

## 2018-02-23 NOTE — Telephone Encounter (Signed)
Called patient to discuss results. Patient with significant elevations in both her CA27.29 and CA 15-3 tumor markers. Patient encouraged to follow up with Dr. Lonia Chimera at [facility] to continue care and treatment. Patient needs PET scan and chemotherapy treatment. I advised her that we would be happy to coordinate care efforts with Dr. Lonia Chimera, however she needed to call his office for a follow up appointment to discuss care going forward. Patient appreciative of communication from the [facility] team.

## 2018-02-27 ENCOUNTER — Encounter: Admit: 2018-02-27 | Discharge: 2018-02-28 | Payer: MEDICARE

## 2018-02-27 ENCOUNTER — Encounter
Admit: 2018-02-27 | Discharge: 2018-02-28 | Payer: MEDICARE | Attending: Hematology & Oncology | Primary: Hematology & Oncology

## 2018-02-27 ENCOUNTER — Ambulatory Visit: Admit: 2018-02-27 | Discharge: 2018-02-28 | Payer: MEDICARE

## 2018-02-27 DIAGNOSIS — C50512 Malignant neoplasm of lower-outer quadrant of left female breast: Secondary | ICD-10-CM

## 2018-02-27 DIAGNOSIS — Z170 Estrogen receptor positive status [ER+]: Secondary | ICD-10-CM

## 2018-02-27 DIAGNOSIS — C50919 Malignant neoplasm of unspecified site of unspecified female breast: Secondary | ICD-10-CM

## 2018-02-27 DIAGNOSIS — C50912 Malignant neoplasm of unspecified site of left female breast: Principal | ICD-10-CM

## 2018-02-27 DIAGNOSIS — C50012 Malignant neoplasm of nipple and areola, left female breast: Secondary | ICD-10-CM

## 2018-02-27 LAB — COMPREHENSIVE METABOLIC PANEL
ALBUMIN: 2.7 g/dL — ABNORMAL LOW (ref 3.5–5.0)
ALKALINE PHOSPHATASE: 374 U/L — ABNORMAL HIGH (ref 38–126)
ALT (SGPT): 97 U/L — ABNORMAL HIGH (ref 15–48)
ANION GAP: 6 mmol/L — ABNORMAL LOW (ref 9–15)
AST (SGOT): 233 U/L — ABNORMAL HIGH (ref 14–38)
BILIRUBIN TOTAL: 4 mg/dL — ABNORMAL HIGH (ref 0.0–1.2)
BUN / CREAT RATIO: 18
CALCIUM: 8.6 mg/dL (ref 8.5–10.2)
CHLORIDE: 106 mmol/L (ref 98–107)
CO2: 27 mmol/L (ref 22.0–30.0)
CREATININE: 0.65 mg/dL (ref 0.60–1.00)
EGFR MDRD AF AMER: >=60 mL/min/{1.73_m2} (ref >=60)
EGFR MDRD NON AF AMER: >=60 mL/min/{1.73_m2} (ref >=60)
GLUCOSE RANDOM: 177 mg/dL (ref 65–179)
POTASSIUM: 3.9 mmol/L (ref 3.5–5.0)
PROTEIN TOTAL: 5.6 g/dL — ABNORMAL LOW (ref 6.5–8.3)
SODIUM: 139 mmol/L (ref 135–145)

## 2018-02-27 LAB — CBC W/ AUTO DIFF
BASOPHILS ABSOLUTE COUNT: 0 10*9/L (ref 0.0–0.1)
BASOPHILS RELATIVE PERCENT: 0.4 %
EOSINOPHILS ABSOLUTE COUNT: 0 10*9/L (ref 0.0–0.4)
EOSINOPHILS RELATIVE PERCENT: 1.8 %
HEMATOCRIT: 44.4 % (ref 36.0–46.0)
HEMOGLOBIN: 14.1 g/dL (ref 13.5–16.0)
LARGE UNSTAINED CELLS: 1 % (ref 0–4)
LYMPHOCYTES ABSOLUTE COUNT: 0.5 10*9/L — ABNORMAL LOW (ref 1.5–5.0)
MEAN CORPUSCULAR HEMOGLOBIN CONC: 31.8 g/dL (ref 31.0–37.0)
MEAN CORPUSCULAR HEMOGLOBIN: 32.1 pg (ref 26.0–34.0)
MEAN CORPUSCULAR VOLUME: 100.9 fL — ABNORMAL HIGH (ref 80.0–100.0)
MONOCYTES RELATIVE PERCENT: 1.8 %
NEUTROPHILS ABSOLUTE COUNT: 1.3 10*9/L — ABNORMAL LOW (ref 2.0–7.5)
NEUTROPHILS RELATIVE PERCENT: 66.3 %
PLATELET COUNT: 65 10*9/L — ABNORMAL LOW (ref 150–440)
RED BLOOD CELL COUNT: 4.4 10*12/L (ref 4.00–5.20)
RED CELL DISTRIBUTION WIDTH: 17.5 % — ABNORMAL HIGH (ref 12.0–15.0)
WBC ADJUSTED: 1.9 10*9/L — ABNORMAL LOW (ref 4.5–11.0)

## 2018-02-27 LAB — EGFR MDRD AF AMER: Glomerular filtration rate/1.73 sq M.predicted.black:ArVRat:Pt:Ser/Plas/Bld:Qn:Creatinine-based formula (MDRD): >=60

## 2018-02-27 LAB — PHOSPHORUS: Phosphate:MCnc:Pt:Ser/Plas:Qn:: 3.1

## 2018-02-27 LAB — MACROCYTES

## 2018-02-27 LAB — ALBUMIN: Albumin:MCnc:Pt:Ser/Plas:Qn:: 2.7 — ABNORMAL LOW

## 2018-02-27 LAB — CALCIUM: Calcium:MCnc:Pt:Ser/Plas:Qn:: 8.6

## 2018-02-27 LAB — LACTATE DEHYDROGENASE: Lactate dehydrogenase:CCnc:Pt:Ser/Plas:Qn:: 1408 — ABNORMAL HIGH

## 2018-02-27 LAB — EGFR MDRD NON AF AMER: Glomerular filtration rate/1.73 sq M.predicted.non black:ArVRat:Pt:Ser/Plas/Bld:Qn:Creatinine-based formula (MDRD): >=60

## 2018-02-27 MED ORDER — CAPECITABINE 500 MG TABLET
ORAL_TABLET | Freq: Two times a day (BID) | ORAL | 11 refills | 0 days | Status: CP
Start: 2018-02-27 — End: 2018-03-13

## 2018-02-27 MED ORDER — FENTANYL 25 MCG/HR TRANSDERMAL PATCH: 1 | patch | 0 refills | 0 days | Status: AC

## 2018-02-27 MED ORDER — FENTANYL 25 MCG/HR TRANSDERMAL PATCH
MEDICATED_PATCH | TRANSDERMAL | 0 refills | 0 days | Status: CP
Start: 2018-02-27 — End: 2018-03-16

## 2018-02-27 NOTE — Unmapped (Addendum)
--Stop taking Ibrance, and we will hold Faslodex, and Lupron.  We will plan to continue on Xgeva for the time being.  --Start Capecitabine been 500 mg taking 4 tablets twice daily, for 14 days.  Hold the medication if you develop any unacceptable side effects such as mouth sores, diarrhea, skin changes.  To avoid the skin changes, avoid really hot or really cold water, bleach, or strong detergents.  Also, if you can keep your hands well hydrated, with a good skin cream like Udder Cream, you can often avoid the worst of the skin changes.  --You may take tramadol 50 mg, 1 or 2 tablets, every 4-6 hours as needed for breakthrough pain.  --Stop taking omeprazole (Prilosec), at least on the same days that you take capecitabine.  For acid reflux, use Zantac(ranitidine) 300 mg at bedtime, and a liquid antacid as needed.  --Started on fentanyl 25 mcg patch every 3 days, with tramadol for breakthrough pain.  --Take MiraLAX 1 capful every night; if your stools do not become slightly loose, consider taking MiraLAX twice daily.  --Have Senokot on hand, and use 1 or 2 tablets, twice daily as needed to ensure that you have at least one good bowel movement daily.  --Call the clinic at [phone], or [phone], or send a message using the email feature of My [facility] Chart if any new questions or problems arise.  --We will see you back in 1 week, sooner if new problems arise.     capecitabine  Pronunciation:  KAP e SYE ta been  Brand:  Xeloda  What is the most important information I should know about capecitabine?  You should not take capecitabine if you have severe kidney disease or a metabolic disorder called DPD (dihydropyrimidine dehydrogenase) deficiency.  If you take a blood thinner (warfarin, Coumadin, Jantoven), you may need to have more frequent INR or prothrombin time tests. Taking a blood thinner can increase your risk of severe bleeding while you are using capecitabine, and for a short time after you stop taking capecitabine. This risk is higher in adults older than 60.  What is capecitabine?  Capecitabine is a cancer medicine that interferes with the growth of cancer cells and slows their spread in the body.  Capecitabine is used to treat colon cancer, and breast or colorectal cancer that has spread to other parts of the body.  Capecitabine is often used in combination with other cancer medications and/or radiation treatments.  Capecitabine may also be used for purposes not listed in this medication guide.  What should I discuss with my healthcare provider before taking capecitabine?  You should not take this medicine if you are allergic to capecitabine or fluorouracil (Adrucil), or if you have:  ?? severe kidney disease; or  ?? a metabolic disorder called DPD (dihydropyrimidine dehydrogenase) deficiency.  To make sure capecitabine is safe for you, tell your doctor if you have any of these conditions:  ?? kidney disease;  ?? bleeding or blood clotting disorder such as hemophilia;  ?? liver disease;  ?? a history of coronary artery disease; or  ?? if you take a blood thinner (warfarin, Coumadin, Jantoven).  Do not use capecitabine if you are pregnant. It could harm the unborn baby.  Use birth control to prevent pregnancy while you are taking capecitabine, whether you are a man or a woman. Tell your doctor if a pregnancy occurs during treatment.  It is not known whether capecitabine passes into breast milk or if it could harm a  nursing baby. You should not breast-feed while you are taking capecitabine.  How should I take capecitabine?  Capecitabine is usually taken twice per day. Follow the directions on your prescription label. Do not take this medicine in larger or smaller amounts or for longer than recommended.  Capecitabine is given in a 3-week treatment cycle, and you may only need to take the medicine during the first 2 weeks of each cycle. Your doctor will determine how long to treat you with capecitabine.  Capecitabine is only part of a treatment program that may also include other medications taken on different schedules. Follow your doctor's dosing instructions very carefully.  Capecitabine should be taken with food or within 30 minutes after eating a meal.  Take capecitabine with a full glass (8 ounces) of water.  Call your doctor if you are sick with vomiting or diarrhea, if you are unable to eat because of stomach illness, or if you are sweating more than usual. Prolonged illness can lead to dehydration or kidney failure.  You may need frequent medical tests to be sure this medicine is not causing harmful effects. Your cancer treatments may be delayed based on the results of these tests. Capecitabine can have long lasting effects on your body. You may need frequent medical tests for a short time after you stop using this medicine.  You must remain under the care of a doctor while you are taking capecitabine.  Store at room temperature away from moisture and heat. Keep the bottle tightly closed when not in use.  Read all patient information, medication guides, and instruction sheets provided to you. Ask your doctor or pharmacist if you have any questions.  What happens if I miss a dose?  Take the missed dose as soon as you remember. Skip the missed dose if it is almost time for your next scheduled dose. Do not take extra medicine to make up the missed dose.  What happens if I overdose?  Seek emergency medical attention or call the Poison Help line at [phone].  What should I avoid while taking capecitabine?  This medicine can pass into body fluids (urine, feces, vomit). Caregivers should wear rubber gloves while cleaning up a patient's body fluids, handling contaminated trash or laundry or changing diapers. Wash hands before and after removing gloves. Wash soiled clothing and linens separately from other laundry.  What are the possible side effects of capecitabine?  Get emergency medical help if you have signs of an allergic reaction: hives; difficult breathing; swelling of your face, lips, tongue, or throat.  Call your doctor at once if you have:  ?? fever above 100.5 degrees;  ?? nausea, loss of appetite, eating much less than usual, vomiting (more than once in 24 hours);  ?? severe diarrhea (more than 4 times per day, or during the night);  ?? blisters or ulcers in your mouth, red or swollen gums, trouble swallowing;  ?? pain, tenderness, redness, swelling, blistering, or peeling skin on your hands or feet;  ?? dehydration symptoms --feeling very thirsty or hot, being unable to urinate, heavy sweating, or hot and dry skin;  ?? heart problems --chest pain or pressure, uneven heartbeats, shortness of breath (even with mild exertion), swelling or rapid weight gain;  ?? kidney problems --little or no urinating; painful or difficult urination; swelling in your feet or ankles; feeling tired or short of breath;  ?? liver problems --nausea, upper stomach pain, itching, tired feeling, loss of appetite, dark urine, clay-colored stools, jaundice (yellowing of  the skin or eyes);  ?? low blood cell counts --fever or other flu symptoms, cough, skin sores, pale skin, easy bruising, unusual bleeding, feeling light-headed, rapid heart rate; or  ?? severe skin reaction --fever, sore throat, swelling in your face or tongue, burning in your eyes, skin pain, followed by a red or purple skin rash that spreads (especially in the face or upper body) and causes blistering and peeling.  Common side effects may include:  ?? stomach pain or upset, constipation;  ?? tired feeling;  ?? mild skin rash; or  ?? numbness or tingling in your hands or feet.  This is not a complete list of side effects and others may occur. Call your doctor for medical advice about side effects. You may report side effects to FDA at 1-800-FDA-1088.  What other drugs will affect capecitabine?  If you take a blood thinner (warfarin, Coumadin, Jantoven), you may need to have more frequent INR or prothrombin time tests. Taking a blood thinner can increase your risk of severe bleeding while you are using capecitabine, and for a short time after you stop taking capecitabine. This risk is higher in adults older than 60.  Other drugs may interact with capecitabine, including prescription and over-the-counter medicines, vitamins, and herbal products. Tell each of your health care providers about all medicines you use now and any medicine you start or stop using.  Where can I get more information?  Your doctor or pharmacist can provide more information about capecitabine.  Remember, keep this and all other medicines out of the reach of children, never share your medicines with others, and use this medication only for the indication prescribed.  Every effort has been made to ensure that the information provided by [employer]') is accurate, up-to-date, and complete, but no guarantee is made to that effect. Drug information contained herein may be time sensitive. [employer] information has been compiled for use by healthcare practitioners and consumers in the [place] and therefore [employer] does not warrant that uses outside of the [place] are appropriate, unless specifically indicated otherwise. [employer]'s drug information does not endorse drugs, diagnose patients or recommend therapy. [employer]'s drug information is an [employer] corporate to assist licensed healthcare practitioners in caring for their patients and/or to serve consumers viewing this service as a supplement to, and not a substitute for, the expertise, skill, knowledge and judgment of healthcare practitioners. The absence of a warning for a given drug or drug combination in no way should be construed to indicate that the drug or drug combination is safe, effective or appropriate for any given patient. [employer] does not assume any responsibility for any aspect of healthcare administered with the aid of information [employer] provides. The information contained herein is not intended to cover all possible uses, directions, precautions, warnings, drug interactions, allergic reactions, or adverse effects. If you have questions about the drugs you are taking, check with your doctor, nurse or pharmacist.  Copyright [phone] [employer] Version: 7.03. Revision date: 08/26/2014.  Care instructions adapted under license by [facility]. If you have questions about a medical condition or this instruction, always ask your healthcare professional. [employer] disclaims any warranty or liability for your use of this information.

## 2018-02-27 NOTE — Unmapped (Signed)
Followup Visit Note    Patient Name: Diane Gonzalez  Patient Age: 42 y.o.  Encounter Date: 02/27/2018    Referring Physician:   Towanda Malkin, MD  [address]  [id number]  [place], [place] [address]    Assessment/Plan:    Reason for Visit  Breast Cancer    Cancer Staging  Breast cancer, left breast (CMS-HCC)  Staging form: Breast, AJCC 7th Edition  - Clinical: ER 98%, PR 92%, HER2 0, not amplified; Gr 2 (Nott 6); BRCA 1/2 w.t. - Unsigned  - Pathologic: Stage IIB (T2, N35mi, cM0) - Signed by Talbert Cage, DO on 01/28/2014        1.  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        -- 07/14/17: PET CT: progression in T1/T5;          -- 08/02/17: Ibrance 125 mg daily        --02/27/2018: PET/CT: Progression in bone and liver and portal nodes; pancytopenia and elevated bilirubin/LFTs: Ibrance/Faslodex/Lupron stopped; start Xeloda 1000 mg/m?? twice daily for 14 days     2.  Status post hysterectomy, ovaries/ tubes intact       3.  History of viral encephalitis with coma, with mild residual neurologic symptoms     4.  Lymphedema     5. Syncope, probable orthostatic; w/u underway with Dr Julio Alm.        -- MRI of CNS without obvious lesion, final radiology report pending  01/19/16        --  EEG with out evidence of seizures.        -- Cardiology evaluation suggests not cardiac in etiology; referred to neurology        -- OSA 06/15/16; recommended positional therapy     6.  Depression; followed by [name];  on Pristiq and Zyprexa; off of gabapentin     7.  Headaches; resolved off of Effexor     8.  H/O meningitis, with brain biopsy       9.  Osteopenia  DEXA   01/19/16     10. Je[employer] Witness      11. OSA, w/ RLS; recommended positional sleeping, or CPAP      -- 01/27/18: CPAP at 38mmH2) recommended    12. S/p Appendectomy, 05/16/17 [facility])    13. Lumbosacral mediated pain, lumbar DDD, possible sciatica          --  07/17/17: Caudal epidural steroid injection with good pain relief.           Plan:   --The patient has failed Faslodex/Ibrance/Lupron with rapidly progressive disease in the liver, and increasing sites of bony disease.  She does not have neurologic symptoms to suggest CNS metastases.  --Due to the low counts, and elevated bilirubin, alternative therapy may be difficult to deliver.  We have discussed single agent Gemzar, Gemzar with platinum, and capecitabine.  After discussing the risks and benefits of each treatment, the patient is elected to go on capecitabine.  Will be dosed at 1000 mg/m??.  The risks of treatment were reviewed, and patient was advised to avoid agents harsh to the skin, and to have a good skin cream like Udder Cream available to maintain skin hydration.  She will start on this medication when it is available, will have close follow-up of her counts, the assumption being that the progressive thrombocytopenia secondary to [facility], with metabolism altered by liver disease.  Patient will be switched to fentanyl, to decrease constipation.  She has been advised to use MiraLAX and Senokot to maintain function.  She will hold omeprazole while taking capecitabine.      Patient instructions:  --Stop taking Ibrance, and we will hold Faslodex, and Lupron.  We will plan to continue on Xgeva for the time being.  --Start Capecitabine been 500 mg taking 4 tablets twice daily, for 14 days.  Hold the medication if you develop any unacceptable side effects such as mouth sores, diarrhea, skin changes.  To avoid the skin changes, avoid really hot or really cold water, bleach, or strong detergents.  Also, if you can keep your hands well hydrated, with a good skin cream like Udder Cream, you can often avoid the worst of the skin changes.  --You may take tramadol 50 mg, 1 or 2 tablets, every 4-6 hours as needed for breakthrough pain.  --Stop taking omeprazole (Prilosec), at least on the same days that you take capecitabine.  For acid reflux, use Zantac(ranitidine) 300 mg at bedtime, and a liquid antacid as needed.  --Started on fentanyl 25 mcg patch every 3 days, with tramadol for breakthrough pain.  --Take MiraLAX 1 capful every night; if your stools do not become slightly loose, consider taking MiraLAX twice daily.  --Have Senokot on hand, and use 1 or 2 tablets, twice daily as needed to ensure that you have at least one good bowel movement daily.  --Call the clinic at [phone], or [phone], or send a message using the email feature of My [facility] Chart if any new questions or problems arise.  --We will see you back in 1 week, sooner if new problems arise.                 I have reviewed the laboratory, pathology, and radiology reports in detail and discussed findings with the patient.  The patient had a restaging MRI of the abdomen today. Preliminarily, no new lesions noted in the liver.    Interval History:  The patient returns for follow up.  She is feeling poorly, with right upper quadrant pain, and malaise.  She was last seen, she was complaining of pain in the left ribs, at the site of her previous fracture, and of nausea.  We tried adding ranitidine which did not help.  When she developed right-sided pain, was quite severe.  She reported to the emergency room at [facility].  CT scan showed probable multiple liver metastases, although some  areas of change may be due to focal fatty changes.  I spoke with Dr. Merlene Pulling, an oncologist at [facility], regarding the case.  The patient's platelets and white count were low.  She was advised to stop Ibrance, but did not do so.  She is here for scheduled PET/CT, and potentially ongoing treatment.  In addition to the pain in the right upper quadrant, the patient has become severely constipated since being placed on MS Contin 15 mg every 12 hours.  This has given her fairly good pain control, and she has not needed to take tramadol for breakthrough pain.  She denies rash, fevers, chills, other malaise, headaches, visual changes, seizures, dyspnea, cough, anorexia, bowel or bladder dysfunction, swollen glands and other areas of pain.           Breast cancer, left breast (CMS-HCC)    11/22/2013 Initial Diagnosis     Breast cancer, left breast; 4.8 cm left breast mass; suspicious nodes.         11/25/2013 Biopsy     IDC, Gr 2; ER +, PR +, HER2 0/not amplified         12/11/2013 -  Cancer Staged     BRCA 1/2 without mutation         12/31/2013 Surgery     left simple mastectomy and SLND: 4.5 cm primary; DCIS 1 of 5 positive node with 1.3 mm deposit.         01/10/2014 Surgery     ALND: 0 of 8 nodes imvolved.         02/13/2014 - 04/17/2014 Chemotherapy     Taxotere/Cyclophosphamide x 4 cycles         05/07/2014 - 06/27/2014 Radiation     33 fractions.         07/18/2014 - 01/24/2015 Chemotherapy     Tamoxifen. Stopped secondary to side effects.         02/01/2015 -  Chemotherapy     Leuprolide and letrozole         03/02/2015 -  Chemotherapy     Anastrozole in place of letrozole, secondary to nausea; not tolerated. Prescription for exemestane written, but not filled.         11/09/2015 -  Chemotherapy     Exemestane started by the patient         11/09/2015 -  Cancer Staged     Chest CT:  Irregular, mottled, and mildly expansile appearance of left seventh posterior rib may represent osseous metastasis. No lytic or blastic lesions are noted in thoracic spine. Mild degenerative changes of thoracic spine.          11/24/2015 -  Cancer Staged     PET/CT: Nodule in the left liver lobe, corresponds to ring-enhancing lesion present on 11/09/15 chest CT; left seventh rib equivocal for metastatic disease versus fracture. No other significant areas of uptake.         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 -- Two tiny T2 hyperintense lesions in the segment VII and segment VIA ;probable cysts.         02/05/2016 Surgery  Invasive adenocarcinoma, consistent with a metastasis from a breast (ductal) primary (2.5 cm) - Parenchymal resection margin is widely free (2.5 cm to carcinoma) ?????? - Estrogen receptor: Positive (95%, 3+; PR 3+; HER2 0         03/28/2016 -  Chemotherapy     Fulvestrant 500 mg         07/06/2016 -  Cancer Staged     CT [id number]:[id number] sclerotic lesion concerning for metastases in a patient with history of breast cancer. Correlation with nuclear medicine bone scintigraphy is recommended.  -Nodular enhancement in the midline incision as well as in the underlying mesentery is indeterminant and may represent postsurgical change, less likely metastatic disease. Attention on follow-up.         07/15/2016 -  Cancer Staged     MRI L-spine without contrast: Mild degenerative changes in lumbar spine without significant spinal canal or neural foraminal narrowing. No evidence of malignancy.         07/19/2016 -  Cancer Staged     NM Bone Scan:  Persistent uptake in the left posterior seventh rib, likely healing fracture.  - Sclerotic sacral lesion without increased radiotracer uptake.         01/26/2017 -  Cancer Staged     PET-CT:  - Increased uptake at the hepatic resection site, most likely postsurgical change. Recommend clinical correlation. A PET/CT in 3-6 months may be helpful to more fully characterize  - Probable healing fracture in the left seventh rib and treated metastasis in the sacrum. Recommend attention on future scans.          07/06/2017 -  Cancer Staged       MRI L-Spine: Sclerotic lesion at the anterior aspect of S2 with thin rim of peripheral enhancement, suggestive of sclerotic metastasis, unchanged in size from prior study. Additional ill-defined focus of enhancement in the right sacrum adjacent to the S2 neural foramen may also represent a small site of metastasis.  - L5-S1 central disc protrusion with mild central canal narrowing and neural foraminal narrowing.  - Findings suggestive of mild osteitis pubis.  - Left gluteus medius minimus tendinosis/partial tear. Mild right gluteus minimus tendinosis.         07/14/2017 -  Cancer Staged       PET-CT:    Overall progression of disease with new avid FDG uptake in the T1 vertebral body and T5 right pedicle concerning for metastatic disease. Along with interval increase in the FDG uptake in the more focal hepatic lesion when compared to prior imaging.    - Stable lesions in the left seventh rib and sacrum.                 07/27/2017 -  Chemotherapy     Palbociclib  125 mg for 21 of 28 days.         11/16/2017 -  Cancer Staged       PET/CT:  Overall progression of disease with new osseous metastases in the T8, T11 vertebral body and the right sacrum along with progression of previously identified osseous metastases, including T7.  Note that these areas of uptake are not seen in PACS (visible only on radiology department software), did not have CT scan correlate.    - Interval resolution of the previously noted hepatic lesion adjacent the suture line however there is a new ill-defined hypermetabolic hepatic focus without evidence CT correlation. Recommend attention on follow-up imaging.         12/18/2017 -  Cancer Staged       Bone scan:  Decrease in uptake in the left posterior seventh rib is prior.  -Focal increased uptake at the T10 and L2 vertebral body as above.           02/27/2018 -  Cancer Staged       PET CT:   Overall progression of disease with innumerable new hypermetabolic osseous foci throughout the appendicular and axial skeleton as described above. There is also multiple new hypermetabolic foci remainder of the left hepatic lobe and right hepatic lobe without definite CT correlation as described above.    - Hypermetabolic pericaval lymph node in the abdomen is of unknown significance but may represent an additional site of disease.                    The following portions of the patient's history were reviewed and updated as appropriate: current medications and problem list.    Review of Systems   All other systems reviewed and are negative.      Vital signs for this encounter:  BSA: 2.07 meters squared  BP 140/92  - Pulse 110  - Temp 35.8 ??C (96.4 ??F) (Oral)  - Wt 98.1 kg (216 lb 4.8 oz)  - SpO2 94%  - BMI 39.56 kg/m??      Repeat BP with the manual cuff:  142/98    Physical Exam  Constitutional: She is oriented to person, place, and time. She appears well-developed and well-nourished. No distress.   HENT:   Head: Normocephalic.   Mouth/Throat: Oropharynx is clear and moist. Scant clear drainage in the pharynx  Eyes: EOM are normal. Pupils are equal, round, and reactive to light.   Neck: No JVD present. No thyromegaly present.   Cardiovascular: Normal rate and normal heart sounds. ??  Pulmonary/Chest: Effort normal and breath sounds normal.   Breast: 02/26/18:??Left chest wall is without nodules or tenderness; there are some areas of scarring, unchanged from previously. The most prominent site measures about 0.5-1 cm, and is lateral to the midclavicular line, just above the scar. The right breast is without suspicious nodules or discharge.   Lymph: No adenopathy in the neck, supraclavicular, axillary or inguinal regions.??  Abdominal: Soft. She exhibits no distension and no mass. There is no tenderness, although patient indicates that point about 4 cm below the costal margin in the midclavicular line at the site of tenderness when not adequately treated with morphine.  Musculoskeletal: She exhibits no edema. No tenderness to percussion over spine, chest wall,  pelvis or hips. No tenderness to palpation over the sacrum, or SI joint regions bilaterally.??   Neurological: She is alert and oriented to person, place, and time. No cranial nerve deficit. She exhibits normal muscle tone. Coordination normal.   Skin: Mild candidal skin infection in the groin.  Healing follicular abscess on the right breast  Psychiatric: She has a normal mood and affect. Her behavior is normal.         Karnofsky/Lansky Performance Status  80, Normal activity with effort; some signs or symptoms of disease (ECOG equivalent 1)     Results:    WBC   Date Value Ref Range Status   02/27/2018 1.9 (L) 4.5 - 11.0 10*9/L Final   10/24/2014 5.8 4.5 - 11.0 10*9/L Final     HGB   Date Value Ref Range Status   02/27/2018 14.1 13.5 - 16.0 g/dL Final   16/09/9603 54.0 12.0 - 16.0 g/dL Final  HCT   Date Value Ref Range Status   02/27/2018 44.4 36.0 - 46.0 % Final   10/24/2014 37.7 36.0 - 46.0 % Final     Platelet   Date Value Ref Range Status   02/27/2018 65 (L) 150 - 440 10*9/L Final   10/24/2014 199 150 - 440 10*9/L Final     LDH   Date Value Ref Range Status   02/27/2018 1,408 (H) 338 - 610 U/L Final     Creatinine Whole Blood, POC   Date Value Ref Range Status   07/06/2016 0.4 (L) 0.7 - 1.1 mg/dL Final     Creatinine   Date Value Ref Range Status   02/27/2018 0.65 0.60 - 1.00 mg/dL Final   60/45/4098 1.19 0.60 - 1.00 mg/dL Final     AST   Date Value Ref Range Status   02/27/2018 233 (H) 14 - 38 U/L Final   01/22/2015 29 14 - 38 U/L Final     CEA   Date Value Ref Range Status   12/29/2015 1.5 0.0 - 5.0 ng/mL Final   02/06/2014 <0.5 0.0 - 5.0 ng/mL Final     AFP-Tumor Marker   Date Value Ref Range Status   12/29/2015 2.01 <7.51 ng/mL Final

## 2018-02-27 NOTE — Unmapped (Signed)
Labs drawn via [place].    Port flushed and brisk blood return     Pt tolerated well.    Pt with pain 7/10. Took ms contin from home at this time. Comfort measures given.    30 mins after ms contin and rest pain is a little better.

## 2018-02-28 NOTE — Unmapped (Signed)
[facility] Specialty Medication Referral: No PA required    Medication (Brand/Generic): CAPECITABINE    Initial FSI Test Claim completed with resulted information below:  No PA required  Patient ABLE to fill at [facility]  Insurance Company:  ALL  Anticipated Copay: $0    As Co-pay is under $100 defined limit, per policy there will be no further investigation of need for financial assistance at this time unless patient requests. This referral has been communicated to the provider and handed off to the [facility] team for further processing and filling of prescribed medication.   ______________________________________________________________________  Please utilize this referral for viewing purposes as it will serve as the central location for all relevant documentation and updates.

## 2018-02-28 NOTE — Unmapped (Signed)
A formal request has been initiated with the patient's pharmacy benefit plan seeking prior authorization for the use of fentanyl patches.  The original version of the submitted application/documents is archived in the media section of the electronic medical record.    An addendum will be made to this telephone encounter to reflect the outcome of the prior authorization process.    No other actions taken at this time and Dr. Claude Manges is aware.

## 2018-02-28 NOTE — Unmapped (Signed)
Patient called to cancel delivery of Ibrance for 03/06/18.   Her doctor took her off this medication at this time as it is not working for her.  They're going to switch her to something else and she says it will probably come through [place].  I told her we would reach out to her once we got that new prescription sent over and onboard her as we did this one.  Patient expressed understanding.  Sena Clouatre-CPHT  [facility]

## 2018-03-01 NOTE — Unmapped (Signed)
As directed by Dr. Claude Manges, I have contacted the patient and reviewed with her the following:    ?? The patient would like to have her CBC with differential rechecked on Tuesday, March 06, 2018 given that her prior white blood cell and platelet counts were suboptimal from 02/27/2018.    ?? The patient anticipates starting her capecitabine therapy (2000 mg twice daily for 14 days, hold therapy for 7 days and then resume the cycle) on Monday, March 05, 2018 as coordinated by the [employer].    ?? The follow-up clinic visit that is scheduled for March 06, 2018 with Dr. Claude Manges is to be rescheduled to Tuesday, March 13, 2018.    Patient expressed verbal understanding and had no other questions or concerns at this time and is agreeable to our plan.  Patient is to call the clinic back sooner should she have any other issues.    No other actions taken and Dr. Claude Manges is aware.

## 2018-03-01 NOTE — Unmapped (Signed)
[facility]   Patient Onboarding/Medication Counseling    Diane Gonzalez is a 41 y.o. female with metastatic breast cancer who I am counseling today on initiation of therapy.    Medication: capecitabine 500mg     Verified patient's date of birth / HIPAA.      Education Provided: ??    Dose/Administration discussed: Take 4 tablets by mouth 2 times daily for 14 days. This medication should be taken  without regard to food.  Stressed the importance of taking medication as prescribed and to contact provider if that changes at any time.  Discussed missed dose instructions.    Storage requirements: this medicine should be stored at room temperature.     Side effects / precautions discussed: Discussed common side effects, including, but not limited to, change in nails, dizziness, diarrhea, constipation, eye irritation, hair loss, weight loss, etc.. If patient experiences signs/sympoms of an allergic reaction (hives/rash/itching, red/swollen/blistered/peeling skin, wheezing, tightness in chest/throat, difficultly breathing/swallowing/talking, unusual hoarseness, swelling of mouth/face/lips/tongue/throat, etc.), hand/foot syndrome, s/sx of infections, s/sx of bleeding, s/sx of liver dysfunction, sob, chest pain/pressure, mouth irritation/sores, change in eyesight/eye pain, etc., they need to call the doctor.  Patient will receive a drug information handout with shipment.    Handling precautions / disposal reviewed:  Patient was counseled on the hazards surrounding oral chemotherapy and will minimize contact/exposure to the medicine.    Drug Interactions: other medications reviewed and up to date in Epic.  advised her to hold her omeprazole as directed by MD on days she is dosing capecitabine.    Comorbidities/Allergies: reviewed and up to date in Epic.    Verified therapy is appropriate and should continue      Delivery Information    Medication Assistance provided: Prior Authorization    Anticipated copay of $0.00 reviewed with patient. Verified delivery address in FSI and reviewed medication storage requirement.    Scheduled delivery date: 03/05/18    Explained that we ship using UPS or courier and this shipment will not require a signature.      Explained the services we provide at [facility] and that each month we would call to set up refills.  Stressed importance of returning phone calls so that we could ensure they receive their medications in time each month.  Informed patient that we should be setting up refills 7-10 days prior to when they will run out of medication.  Informed patient that welcome packet will be sent.      Patient verbalized understanding of the above information as well as how to contact the pharmacy at [phone] option 4 with any questions/concerns.  The pharmacy is open Monday through Friday 8:30am-4:30pm.  A pharmacist is available 24/7 via pager to answer any clinical questions they may have.        Patient Specific Needs      ? Patient has no physical, cognitive, or cultural barriers.    ? Patient prefers to have medications discussed with  Patient     ? Patient is able to read and understand education materials at a high school level or above.    ? Patient's primary language is  English           Lupita Shutter  [facility] Specialty Pharmacist

## 2018-03-02 ENCOUNTER — Other Ambulatory Visit: Payer: Self-pay

## 2018-03-02 ENCOUNTER — Emergency Department
Admission: EM | Admit: 2018-03-02 | Discharge: 2018-03-03 | Disposition: A | Discharge status: Home / Self Care | Payer: Medicare Other | Attending: Emergency Medicine | Admitting: Emergency Medicine

## 2018-03-02 ENCOUNTER — Encounter: Payer: Self-pay | Admitting: Emergency Medicine

## 2018-03-02 DIAGNOSIS — R109 Unspecified abdominal pain: Secondary | ICD-10-CM | POA: Diagnosis present

## 2018-03-02 DIAGNOSIS — R197 Diarrhea, unspecified: Secondary | ICD-10-CM | POA: Diagnosis not present

## 2018-03-02 DIAGNOSIS — R1030 Lower abdominal pain, unspecified: Secondary | ICD-10-CM | POA: Insufficient documentation

## 2018-03-02 DIAGNOSIS — R112 Nausea with vomiting, unspecified: Secondary | ICD-10-CM | POA: Diagnosis not present

## 2018-03-02 DIAGNOSIS — Z8505 Personal history of malignant neoplasm of liver: Secondary | ICD-10-CM | POA: Diagnosis not present

## 2018-03-02 DIAGNOSIS — R000 Tachycardia, unspecified: Secondary | ICD-10-CM | POA: Insufficient documentation

## 2018-03-02 DIAGNOSIS — Z853 Personal history of malignant neoplasm of breast: Secondary | ICD-10-CM | POA: Diagnosis not present

## 2018-03-02 DIAGNOSIS — K5900 Constipation, unspecified: Secondary | ICD-10-CM | POA: Insufficient documentation

## 2018-03-02 LAB — CBC
HCT: 39.5 % (ref 35.0–47.0)
HEMOGLOBIN: 13.4 g/dL (ref 12.0–16.0)
MCH: 32.8 pg (ref 26.0–34.0)
MCHC: 33.9 g/dL (ref 32.0–36.0)
MCV: 96.8 fL (ref 80.0–100.0)
Platelets: 51 10*3/uL — ABNORMAL LOW (ref 150–440)
RBC: 4.08 MIL/uL (ref 3.80–5.20)
RDW: 16.2 % — AB (ref 11.5–14.5)
WBC: 2.8 10*3/uL — ABNORMAL LOW (ref 3.6–11.0)

## 2018-03-02 LAB — COMPREHENSIVE METABOLIC PANEL
ALBUMIN: 2.3 g/dL — AB (ref 3.5–5.0)
ALK PHOS: 299 U/L — AB (ref 38–126)
ALT: 85 U/L — ABNORMAL HIGH (ref 14–54)
AST: 192 U/L — AB (ref 15–41)
Anion gap: 10 (ref 5–15)
BILIRUBIN TOTAL: 5.8 mg/dL — AB (ref 0.3–1.2)
BUN: 6 mg/dL (ref 6–20)
CALCIUM: 7.8 mg/dL — AB (ref 8.9–10.3)
CO2: 25 mmol/L (ref 22–32)
Chloride: 101 mmol/L (ref 101–111)
Creatinine, Ser: 0.48 mg/dL (ref 0.44–1.00)
GFR calc Af Amer: >60 mL/min (ref >60)
GFR calc non Af Amer: >60 mL/min (ref >60)
GLUCOSE: 125 mg/dL — AB (ref 65–99)
Potassium: 3.5 mmol/L (ref 3.5–5.1)
SODIUM: 136 mmol/L (ref 135–145)
TOTAL PROTEIN: 5.9 g/dL — AB (ref 6.5–8.1)

## 2018-03-02 LAB — URINALYSIS, COMPLETE (UACMP) WITH MICROSCOPIC
Glucose, UA: NEGATIVE mg/dL
HGB URINE DIPSTICK: NEGATIVE
Ketones, ur: NEGATIVE mg/dL
Leukocytes, UA: NEGATIVE
NITRITE: NEGATIVE
PROTEIN: NEGATIVE mg/dL
SPECIFIC GRAVITY, URINE: 1.018 (ref 1.005–1.030)
pH: 5 (ref 5.0–8.0)

## 2018-03-02 LAB — LIPASE, BLOOD: Lipase: 29 U/L (ref 11–51)

## 2018-03-02 MED ORDER — SODIUM CHLORIDE 0.9 % IV BOLUS
1000.0000 mL | Freq: Once | INTRAVENOUS | Status: AC
  Administered 2018-03-02: 1000 mL via INTRAVENOUS

## 2018-03-02 MED ORDER — HYDROMORPHONE HCL 1 MG/ML IJ SOLN
1.0000 mg | Freq: Once | INTRAMUSCULAR | Status: DC
  Filled 2018-03-02: qty 1

## 2018-03-02 MED ORDER — ONDANSETRON HCL 4 MG/2ML IJ SOLN
4.0000 mg | Freq: Once | INTRAMUSCULAR | Status: AC
  Administered 2018-03-02: 4 mg via INTRAVENOUS
  Filled 2018-03-02: qty 2

## 2018-03-02 MED ORDER — KETOROLAC TROMETHAMINE 30 MG/ML IJ SOLN
30.0000 mg | Freq: Once | INTRAMUSCULAR | Status: AC
  Administered 2018-03-02: 30 mg via INTRAVENOUS
  Filled 2018-03-02: qty 1

## 2018-03-02 MED FILL — CAPECITABINE/500MG/TABS: CAPECITABINE/500MG/TABS | 21 days supply | Qty: 112 | Fill #0

## 2018-03-02 NOTE — ED Provider Notes (Signed)
[facility] Emergency Department Provider Note  ____________________________________________  Time seen: Approximately 10:38 PM  I have reviewed the triage vital signs and the nursing notes.   HISTORY  Chief Complaint Abdominal Pain    HPI Kimberly Key is a 42 y.o. 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  The patient reports that she has been using morphine and then was switched to fentanyl for chronic pain associated with her cancer.  For the past 3 weeks, she has not had a bowel movement but has been passing gas.  For the past 2 weeks she has had intermittent nausea and vomiting.  She has a dull suprapubic pain that cannot be reproduced.  She has not had any fevers, chills, cough or cold symptoms.  Is not currently receiving chemotherapy and has her next oncology appointment 03/06/18.  Past Medical History:  Diagnosis Date  . Acute appendicitis with localized peritonitis   . Adenocarcinoma of left breast ([place]) 05/04/2016   Kristalyn K Gallagher [facility], Simple Mastectomy with sentinal node biopsy   . Alteration consciousness 06/14/2016  . Appendicitis 05/16/2017  . Bilateral polycystic ovarian syndrome 05/04/2016  . Bradycardia 05/04/2016  . Breast CA ([place]) 12/11/2013  . Breast lump 11/25/2013  . Cancer [facility])    Breast Cancer left with lumpectomy  . Chronic headache   . Depression   . History of abnormal cervical Pap smear    09/24/2010- abnormal, HPV Positive, ASCUS  . History of methicillin resistant Staphylococcus aureus infection 05/04/2016  . Impaired glucose tolerance   . Low back pain 05/13/2016  . Major depressive disorder, recurrent severe without psychotic features ([place]) 05/07/2015  . Mild dysplasia of cervix   . Nausea 05/13/2016  . Other acute appendicitis   . Panic attack as reaction to stress 05/04/2016  . Pre-syncope  05/13/2016  . Recurrent major depressive disorder, in partial remission ([place]) 05/07/2015  . Steatohepatitis   . Tinea versicolor 05/04/2016  . Urinary tract infection 08/14/2015    Patient Active Problem List   Diagnosis Date Noted  . Chest pain 02/20/2018  . Abdominal pain 02/20/2018  . Obstructive sleep apnea 01/30/2018  . Appendicitis 05/16/2017  . Acute appendicitis with localized peritonitis   . Other acute appendicitis   . Alteration consciousness 06/14/2016  . Nausea 05/13/2016  . Low back pain 05/13/2016  . Pre-syncope 05/13/2016  . Adenocarcinoma of left breast ([place]) 05/04/2016  . Bradycardia 05/04/2016  . Chronic headache 05/04/2016  . History of methicillin resistant Staphylococcus aureus infection 05/04/2016  . Impaired glucose tolerance 05/04/2016  . Tinea versicolor 05/04/2016  . Panic attack as reaction to stress 05/04/2016  . Bilateral polycystic ovarian syndrome 05/04/2016  . Steatohepatitis 05/04/2016  . Mild cervical dysplasia 05/04/2016  . Major depressive disorder, recurrent severe without psychotic features ([place]) 05/07/2015  . Recurrent major depressive disorder, in partial remission ([place]) 05/07/2015  . Breast CA ([place]) 12/11/2013  . Breast lump 11/25/2013    Past Surgical History:  Procedure Laterality Date  . 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 LAPAROSCOPIC APPENDECTOMY N/A 05/16/2017   Procedure: APPENDECTOMY LAPAROSCOPIC;  Surgeon: Jules Husbands, MD;  Location: [facility] ORS;  Service: General;  Laterality: N/A;  . MASTECTOMY Right 12/31/2013   Erling [facility], Simple mastectomy with sentinal node biopsy    Current Outpatient Rx  . Order #:  [id number] Class: Normal  . Order #: [id number] Class: Historical Med  . Order #: [id number] Class: Historical Med  . Order #: [id number] Class: Historical Med  . Order #: [id number] Class: Normal  . Order #: [id number] Class: Historical Med  . Order #: [id number] Class: Historical Med  .  Order #: [id number] Class: Print  . Order #: [id number] Class: Historical Med  . Order #: [id number] Class: Print  . Order #: [id number] Class: Historical Med  . Order #: [id number] Class: Print    Allergies Bupropion hcl; Cephalexin; Latex; Oxycodone-acetaminophen; Penicillins; Promethazine; and Venlafaxine  Family History  Problem Relation Age of Onset  . Diabetes Mother   . Gallbladder disease Mother   . Coronary artery disease Other   . Depression Other   . Cancer Other   . Seizures Other   . Anxiety disorder Father   . Gallbladder disease Sister   . Diabetes Paternal Uncle        type 2    Social History Social History   Tobacco Use  . Smoking status: Former Smoker    Packs/day: 0.75    Years: 5.00    Pack years: 3.75    Types: Cigarettes    Last attempt to quit: 06/04/2013    Years since quitting: 4.7  . Smokeless tobacco: Never Used  Substance Use Topics  . Alcohol use: No    Alcohol/week: 0.0 oz  . Drug use: No    Review of Systems Constitutional: No fever/chills. Eyes: No visual changes. ENT: No sore throat. No congestion or rhinorrhea. Cardiovascular: Denies chest pain. Denies palpitations. Respiratory: Denies shortness of breath.  No cough. Gastrointestinal: Positive lower suprapubic abdominal pain.  +nausea, +vomiting.  No diarrhea.  + constipation. Genitourinary: Negative for dysuria. Musculoskeletal: Negative for back pain. Skin: Negative for rash. Neurological: Negative for headaches. No focal numbness, tingling or weakness.     ____________________________________________   PHYSICAL EXAM:  VITAL SIGNS: ED Triage Vitals  Enc Vitals Group     BP 03/02/18 2102 126/82     Pulse Rate 03/02/18 2102 (!) 116     Resp 03/02/18 2102 16     Temp 03/02/18 2102 98.9 F (37.2 C)     Temp Source 03/02/18 2102 Oral     SpO2 03/02/18 2051 99 %     Weight 03/02/18 2103 219 lb (99.3 kg)     Height 03/02/18 2103 5\' 3"  (1.6 m)     Head Circumference --       Peak Flow --      Pain Score 03/02/18 2103 10     Pain Loc --      Pain Edu? --      Excl. in [place]? --     Constitutional: Alert and oriented with low speech..  Nuclear ill appearing but in no acute distress. Answers questions appropriately. Eyes: Conjunctivae are normal.  EOMI. No scleral icterus. Head: Atraumatic. Nose: No congestion/rhinnorhea. Mouth/Throat: Mucous membranes are moist.  Neck: No stridor.  Supple.  No JVD. Cardiovascular: Normal rate, regular rhythm. No murmurs, rubs or gallops.  Left mastectomy. Respiratory: Normal respiratory effort.  No accessory muscle use or retractions. Lungs CTAB.  No wheezes, rales or ronchi. Gastrointestinal: Morbidly obese.  Soft, and nondistended.  Able to reproduce any tenderness to palpation on my examination.  No guarding or rebound.  No peritoneal signs. Musculoskeletal: No LE edema. Neurologic:  A&Ox3.  Speech is clear.  Face and smile are symmetric.  EOMI.  Moves all extremities well. Skin:  Skin is warm, dry and intact. No rash noted.  Psychiatric: Depressed mood and affect.  ____________________________________________   LABS (all labs ordered are listed, but only abnormal results are displayed)  Labs Reviewed  COMPREHENSIVE METABOLIC PANEL - Abnormal; Notable for the following components:      Result Value   Glucose, Bld 125 (*)    Calcium 7.8 (*)    Total Protein 5.9 (*)    Albumin 2.3 (*)    AST 192 (*)    ALT 85 (*)    Alkaline Phosphatase 299 (*)    Total Bilirubin 5.8 (*)    All other components within normal limits  CBC - Abnormal; Notable for the following components:   WBC 2.8 (*)    RDW 16.2 (*)    Platelets 51 (*)    All other components within normal limits  URINALYSIS, COMPLETE (UACMP) WITH MICROSCOPIC - Abnormal; Notable for the following components:   Color, Urine AMBER (*)    APPearance CLEAR (*)    Bilirubin Urine SMALL (*)    Bacteria, UA RARE (*)    Squamous Epithelial / LPF 0-5 (*)    All other  components within normal limits  LIPASE, BLOOD   ____________________________________________  EKG  ED ECG REPORT I, Eula Listen, the attending physician, personally viewed and interpreted this ECG.   Not indicated  ____________________________________________  RADIOLOGY  No results found.  ____________________________________________   PROCEDURES  Procedure(s) performed: None  Procedures  Critical Care performed: No ____________________________________________   INITIAL IMPRESSION / ASSESSMENT AND PLAN / ED COURSE  Pertinent labs & imaging results that were available during my care of the patient were reviewed by me and considered in my medical decision making (see chart for details).  42 y.o. female with history of breast cancer, now with metastatic disease to the liver and ribs, presenting with abdominal pain, constipation, nausea and vomiting but continues to pass gas.  Overall, the patient is afebrile.  Her abdomen has no focal tenderness to palpation.  She may just have opioid-induced constipation and we will also get a CT scan to evaluate for bowel obstruction or partial small bowel obstruction.  Her pain could also come from worsening of disease.  Plan symptomatic treatment and reevaluation by the oncoming physician for final disposition.  ____________________________________________  FINAL CLINICAL IMPRESSION(S) / ED DIAGNOSES  Final diagnoses:  Lower abdominal pain  Nausea vomiting and diarrhea  Sinus tachycardia    Clinical Course as of Mar 06 2221  Fri Mar 02, 2018  2256 At this time, the patient has started to have some bowel movements without any acute intervention.   [AN]    Clinical Course User Index [AN] Eula Listen, MD      NEW MEDICATIONS STARTED DURING THIS VISIT:  Discharge Medication List as of 03/03/2018  2:42 AM        Eula Listen, MD 03/06/18 2223

## 2018-03-02 NOTE — ED Triage Notes (Signed)
Patient presents to Emergency Department via [facility] from home with complaints of lower abdominal pain and reports of no BM for the last 2 weeks.  Pt reports recent dx of liver cancer (for the second time), pt reports finished breast cancer treatment (chemo, radiation and left side mastectomy.)     Pt reports nausea and dry heaves today, left arm 25 mcg fentanyl patch, last tramadol last night.   EMS gave 4 mg zofran IV

## 2018-03-02 NOTE — Unmapped (Signed)
Our office has received correspondence this morning that the patient is approved for:    Fentanyl 25 mcg/h patches per 72 hours.  The patient qualifies for 10 patches for 30 days.  This authorization is valid from 02/27/2018 with an end date of Until Further Notice.    The original version of the received authorization notice is archived in the media section of electronic medical record.    The patient has been notified of this approval and is to contact the dispensing pharmacy to process the prescription.    No other actions taken and Dr. Claude Manges is aware.

## 2018-03-03 ENCOUNTER — Encounter: Payer: Self-pay | Admitting: Radiology

## 2018-03-03 ENCOUNTER — Emergency Department: Payer: Medicare Other

## 2018-03-03 DIAGNOSIS — R1030 Lower abdominal pain, unspecified: Secondary | ICD-10-CM | POA: Diagnosis not present

## 2018-03-03 MED ORDER — IOPAMIDOL (ISOVUE-300) INJECTION 61%
100.0000 mL | Freq: Once | INTRAVENOUS | Status: AC | PRN
  Administered 2018-03-03: 100 mL via INTRAVENOUS

## 2018-03-03 MED ORDER — SODIUM CHLORIDE 0.9 % IV BOLUS
1000.0000 mL | Freq: Once | INTRAVENOUS | Status: AC
  Administered 2018-03-03: 1000 mL via INTRAVENOUS

## 2018-03-03 NOTE — ED Notes (Signed)
Patient transported to CT 

## 2018-03-03 NOTE — ED Provider Notes (Addendum)
Signout from Dr. Mariea Clonts in this 42 year old female with metastatic breast cancer with multiple liver metastases was presented with abdominal pain and constipation.  Plan is to follow-up with a CT abdomen and pelvis and reassess.  Physical Exam  BP 129/72 (BP Location: Right Arm)   Pulse (!) 105   Temp 98.9 F (37.2 C) (Oral)   Resp 11   Ht 5\' 3"  (1.6 m)   Wt 99.3 kg (219 lb)   SpO2 95%   BMI 38.79 kg/m  ----------------------------------------- 2:37 AM on 03/03/2018 -----------------------------------------   Physical Exam Pulse 104 while I am in the room.  Patient with minimal right upper quadrant tenderness to palpation without rebound or guarding.  Otherwise the abdomen is soft and nontender. ED Course/Procedures   Clinical Course as of Mar 03 236  Fri Mar 02, 2018  2256 At this time, the patient has started to have some bowel movements without any acute intervention.   [AN]    Clinical Course User Index [AN] Eula Listen, MD    Procedures  MDM  Patient with CT of the abdomen showing hepatomegaly with innumerable hepatic metastatic disease.  I discussed the case with the radiologist, Dr. Quintella Reichert who states that the patient is a normal CBD that is not dilated.  I also discussed the case with the gastroneurologist Dr. Vicente Males regarding the patient's increased bilirubin and he says that the patient will likely be able to be safely discharged home for follow-up with her oncologist as long as the CBD appears normal and nonobstructed.  Updated patient and family at the bedside regarding these findings.  Patient says she feels improved and is now had 2 bowel movements.  Will be discharged home to follow-up with her oncologist.  She is understanding of the plan willing to comply.      Orbie Pyo, MD 03/03/18 [phone]  Furthermore, the heart rate has been steadily decreasing with fluids.  Last when I was in the room it was 104 bpm.  ED ECG REPORT I, Doran Stabler, the attending physician, personally viewed and interpreted this ECG.   Date: 03/03/2018  EKG Time: 0046  Rate: 110  Rhythm: sinus tachycardia  Axis: Normal  Intervals:none  ST&T Change: No ST segment elevation or depression.  No abnormal T wave inversion.  No significant change from previous of March 22 including inferior Q waves.    Orbie Pyo, MD 03/03/18 [phone]

## 2018-03-05 MED ORDER — ARIPIPRAZOLE 5 MG TABLET
ORAL_TABLET | Freq: Every day | ORAL | 0 refills | 0.00000 days | Status: CP
Start: 2018-03-05 — End: 2018-03-29

## 2018-03-05 NOTE — Unmapped (Signed)
Fax received from [facility] pharmacy store # [phone] requesting medication refill for Aripiprazole 5 mg tablet with a quantity of 90.

## 2018-03-06 ENCOUNTER — Encounter: Admit: 2018-03-06 | Discharge: 2018-03-06 | Payer: MEDICARE

## 2018-03-06 DIAGNOSIS — C50912 Malignant neoplasm of unspecified site of left female breast: Secondary | ICD-10-CM

## 2018-03-06 DIAGNOSIS — C50919 Malignant neoplasm of unspecified site of unspecified female breast: Principal | ICD-10-CM

## 2018-03-06 LAB — CBC W/ AUTO DIFF
BASOPHILS ABSOLUTE COUNT: 0 10*9/L (ref 0.0–0.1)
BASOPHILS RELATIVE PERCENT: 1.2 %
EOSINOPHILS RELATIVE PERCENT: 1.2 %
HEMATOCRIT: 43 % (ref 36.0–46.0)
HEMOGLOBIN: 13.6 g/dL (ref 13.5–16.0)
LARGE UNSTAINED CELLS: 4 % (ref 0–4)
LYMPHOCYTES ABSOLUTE COUNT: 0.9 10*9/L — ABNORMAL LOW (ref 1.5–5.0)
LYMPHOCYTES RELATIVE PERCENT: 29.2 %
MEAN CORPUSCULAR HEMOGLOBIN CONC: 31.6 g/dL (ref 31.0–37.0)
MEAN CORPUSCULAR HEMOGLOBIN: 32.7 pg (ref 26.0–34.0)
MEAN PLATELET VOLUME: 9.4 fL (ref 7.0–10.0)
MONOCYTES ABSOLUTE COUNT: 0.2 10*9/L (ref 0.2–0.8)
MONOCYTES RELATIVE PERCENT: 7.9 %
NEUTROPHILS ABSOLUTE COUNT: 1.7 10*9/L — ABNORMAL LOW (ref 2.0–7.5)
NEUTROPHILS RELATIVE PERCENT: 56.4 %
PLATELET COUNT: 74 10*9/L — ABNORMAL LOW (ref 150–440)
RED BLOOD CELL COUNT: 4.15 10*12/L (ref 4.00–5.20)
RED CELL DISTRIBUTION WIDTH: 19.4 % — ABNORMAL HIGH (ref 12.0–15.0)
WBC ADJUSTED: 3 10*9/L — ABNORMAL LOW (ref 4.5–11.0)

## 2018-03-06 LAB — MEAN CORPUSCULAR VOLUME: Lab: 103.6 — ABNORMAL HIGH

## 2018-03-06 NOTE — Discharge Summary (Signed)
[facility] NAME: Kimberly Key    MR#:  [id number]  DATE OF BIRTH:  21-Dec-1975  DATE OF ADMISSION:  02/20/2018 ADMITTING PHYSICIAN: Dustin Flock, MD  DATE OF DISCHARGE: 02/22/2018  3:30 PM  PRIMARY CARE PHYSICIAN: Birdie Sons, MD    ADMISSION DIAGNOSIS:  Liver metastases ([place]) [C78.7] Elevated liver enzymes [R74.8] Right upper quadrant abdominal pain [R10.11] Abdominal pain [R10.9]  DISCHARGE DIAGNOSIS:  Active Problems:   Chest pain   Abdominal pain   SECONDARY DIAGNOSIS:   Past Medical History:  Diagnosis Date  . Acute appendicitis with localized peritonitis   . Adenocarcinoma of left breast ([place]) 05/04/2016   [facility], Simple Mastectomy with sentinal node biopsy   . Alteration consciousness 06/14/2016  . Appendicitis 05/16/2017  . Bilateral polycystic ovarian syndrome 05/04/2016  . Bradycardia 05/04/2016  . Breast CA ([place]) 12/11/2013  . Breast lump 11/25/2013  . Cancer [facility])    Breast Cancer left with lumpectomy  . Chronic headache   . Depression   . History of abnormal cervical Pap smear    09/24/2010- abnormal, HPV Positive, ASCUS  . History of methicillin resistant Staphylococcus aureus infection 05/04/2016  . Impaired glucose tolerance   . Low back pain 05/13/2016  . Major depressive disorder, recurrent severe without psychotic features ([place]) 05/07/2015  . Mild dysplasia of cervix   . Nausea 05/13/2016  . Other acute appendicitis   . Panic attack as reaction to stress 05/04/2016  . Pre-syncope 05/13/2016  . Recurrent major depressive disorder, in partial remission ([place]) 05/07/2015  . Steatohepatitis   . Tinea versicolor 05/04/2016  . Urinary tract infection 08/14/2015    HOSPITAL COURSE:   Patient is a 42 year old white female with metastatic breast cancer with abdominal pain nausea vomiting  1.Abdominal pain nausea vomiting to due to new metastatic cancer in the liver provide her  with pain control and supportive care Added oral MS contin.  Oncology had seen her.  Patient with known metastatic breast cancer due for upcoming PET scan at [facility].  Last scan apparently revealed a questionable mixed response.  We discussed likely discontinuation of her hormonal therapy and initiation of chemotherapy.  We discussed numerous single agent options including Xeloda (oral) and Halaven (IV).  Side effects were reviewed.  Check CA27.29 and CA15-3.  We discussed follow-up with Dr. Lonia Chimera at [facility]     2.Hyperglycemia check a hemoglobin A1c  3.Depression continue her home regimen  4.Miscellaneous Lovenox for DVT prophylaxis  5. Cough- for last 2-3 weeks, acute bronchitis   Levaquin oral for 3 days.     DISCHARGE CONDITIONS:   Stable.  CONSULTS OBTAINED:    DRUG ALLERGIES:   Allergies  Allergen Reactions  . Bupropion Hcl     lightheadness  . Cephalexin Itching  . Latex Rash  . Oxycodone-Acetaminophen Anxiety and Rash    Altered Mental Status  . Penicillins Rash  . Promethazine Rash    Tremors  . Venlafaxine Rash and Other (See Comments)    Patient states she is only able to take Desvenlafaxine-Effexor doesn't work. Patient states she is only able to take Desvenlafaxine    DISCHARGE MEDICATIONS:   Allergies as of 02/22/2018      Reactions   Bupropion Hcl    lightheadness   Cephalexin Itching   Latex Rash   Oxycodone-acetaminophen Anxiety, Rash   Altered Mental Status   Penicillins Rash   Promethazine Rash   Tremors  Venlafaxine Rash, Other (See Comments)   Patient states she is only able to take Desvenlafaxine-Effexor doesn't work. Patient states she is only able to take Desvenlafaxine      Medication List    STOP taking these medications   FASLODEX 250 MG/5ML injection Generic drug:  fulvestrant   LUPRON DEPOT (54-MONTH) IM   traMADol 50 MG tablet Commonly known as:  ULTRAM   XGEVA [place]     TAKE these medications    albuterol 108 (90 Base) MCG/ACT inhaler Commonly known as:  PROVENTIL HFA;VENTOLIN HFA Inhale 2 puffs into the lungs every 6 (six) hours as needed for wheezing or shortness of breath.   ARIPiprazole 5 MG tablet Commonly known as:  ABILIFY Take 5 mg by mouth daily.   DENTA 5000 PLUS 1.1 % Crea dental cream Generic drug:  sodium fluoride Place 1 application onto teeth as needed.   desvenlafaxine 100 MG 24 hr tablet Commonly known as:  PRISTIQ TAKE 1 TABLET (100 MG TOTAL) BY MOUTH DAILY.   guaiFENesin-dextromethorphan 100-10 MG/5ML syrup Commonly known as:  ROBITUSSIN DM Take 5 mLs by mouth every 4 (four) hours as needed for cough.   IBRANCE 125 MG capsule Generic drug:  palbociclib Take 1 capsule by mouth daily. For 21 days   liothyronine 25 MCG tablet Commonly known as:  CYTOMEL Take 25 mcg by mouth daily.   morphine 15 MG 12 hr tablet Commonly known as:  MS CONTIN Take 1 tablet (15 mg total) by mouth every 12 (twelve) hours.   ondansetron 4 MG tablet Commonly known as:  ZOFRAN Take 4 mg by mouth every 8 (eight) hours as needed.   polyethylene glycol packet Commonly known as:  MIRALAX / GLYCOLAX Take 17 g by mouth daily.   ranitidine 300 MG tablet Commonly known as:  ZANTAC Take 1 tablet by mouth at bedtime.   senna-docusate 8.6-50 MG tablet Commonly known as:  Senokot-S Take 1 tablet by mouth 2 (two) times daily.     ASK your doctor about these medications   levofloxacin 500 MG tablet Commonly known as:  LEVAQUIN Take 1 tablet (500 mg total) by mouth daily at 6 PM for 2 days. Ask about: Should I take this medication?   omeprazole 20 MG capsule Commonly known as:  PRILOSEC Take 20 mg by mouth daily. Ask about: Should I take this medication?        DISCHARGE INSTRUCTIONS:    Follow with oncology clinic in 1-2 weeks.  If you experience worsening of your admission symptoms, develop shortness of breath, life threatening emergency, suicidal or  homicidal thoughts you must seek medical attention immediately by calling 911 or calling your MD immediately  if symptoms less severe.  You Must read complete instructions/literature along with all the possible adverse reactions/side effects for all the Medicines you take and that have been prescribed to you. Take any new Medicines after you have completely understood and accept all the possible adverse reactions/side effects.   Please note  You were cared for by a hospitalist during your hospital stay. If you have any questions about your discharge medications or the care you received while you were in the hospital after you are discharged, you can call the unit and asked to speak with the hospitalist on call if the hospitalist that took care of you is not available. Once you are discharged, your primary care physician will handle any further medical issues. Please note that NO REFILLS for any discharge medications will be authorized  once you are discharged, as it is imperative that you return to your primary care physician (or establish a relationship with a primary care physician if you do not have one) for your aftercare needs so that they can reassess your need for medications and monitor your lab values.    Today   CHIEF COMPLAINT:   Chief Complaint  Patient presents with  . Emesis  . Abdominal Pain    HISTORY OF PRESENT ILLNESS:  Kimberly Key  is a 42 y.o. female with a known history of breast cancer who is followed at [facility] presenting to the emergency room with complaint of right upper quadrant abdominal pain as well as emesis.  Patient states that the symptoms have been going on for the past 4 weeks.  She was seen by her primary care provider and had ultrasound of the liver which showed metastatic liver disease.  Patient does have a history of having apparently a lesion in the liver which apparently was resected.  And had a CT scan last year which showed no evidence of metastatic liver  disease.  Today she had a CT scan which showed metastases in the liver.  Due to persistent abdominal pain were asked to admit the patient.   VITAL SIGNS:  Blood pressure 129/87, pulse 89, temperature 98.2 F (36.8 C), temperature source Oral, resp. rate 18, height 5\' 3"  (1.6 m), weight 99.4 kg (219 lb 3.2 oz), SpO2 99 %.  I/O:  No intake or output data in the 24 hours ending 03/06/18 2221  PHYSICAL EXAMINATION:  GENERAL:  42 y.o.-year-old patient lying in the bed with no acute distress.  EYES: Pupils equal, round, reactive to light and accommodation. No scleral icterus. Extraocular muscles intact.  HEENT: Head atraumatic, normocephalic. Oropharynx and nasopharynx clear.  NECK:  Supple, no jugular venous distention. No thyroid enlargement, no tenderness.  LUNGS: Normal breath sounds bilaterally, no wheezing, rales,rhonchi or crepitation. No use of accessory muscles of respiration.  CARDIOVASCULAR: S1, S2 normal. No murmurs, rubs, or gallops.  ABDOMEN: Soft, non-tender, non-distended. Bowel sounds present. No organomegaly or mass.  EXTREMITIES: No pedal edema, cyanosis, or clubbing.  NEUROLOGIC: Cranial nerves II through XII are intact. Muscle strength 5/5 in all extremities. Sensation intact. Gait not checked.  PSYCHIATRIC: The patient is alert and oriented x 3.  SKIN: No obvious rash, lesion, or ulcer.   DATA REVIEW:   CBC Recent Labs  Lab 03/02/18 2119  WBC 2.8*  HGB 13.4  HCT 39.5  PLT 51*    Chemistries  Recent Labs  Lab 03/02/18 2119  NA 136  K 3.5  CL 101  CO2 25  GLUCOSE 125*  BUN 6  CREATININE 0.48  CALCIUM 7.8*  AST 192*  ALT 85*  ALKPHOS 299*  BILITOT 5.8*    Cardiac Enzymes No results for input(s): TROPONINI in the last 168 hours.  Microbiology Results  Results for orders placed or performed during the hospital encounter of 05/16/17  MRSA PCR Screening     Status: None   Collection Time: 05/17/17  4:20 AM  Result Value Ref Range Status   MRSA by  PCR NEGATIVE NEGATIVE Final    Comment:        The GeneXpert MRSA Assay (FDA approved for NASAL specimens only), is one component of a comprehensive MRSA colonization surveillance program. It is not intended to diagnose MRSA infection nor to guide or monitor treatment for MRSA infections.     RADIOLOGY:  No results found.  EKG:  Orders placed or performed during the hospital encounter of 02/20/18  . ED EKG  . ED EKG  . EKG 12-Lead  . EKG 12-Lead  . EKG      Management plans discussed with the patient, family and they are in agreement.  CODE STATUS:  Code Status History    Date Active Date Inactive Code Status Order ID Comments User Context   02/20/2018 2002 02/22/2018 1837 Full Code [id number]  Dustin Flock, MD Inpatient   02/20/2018 2002 02/20/2018 2002 Full Code [id number]  Dustin Flock, MD Inpatient   05/16/2017 2108 05/17/2017 1549 Full Code [id number]  Jules Husbands, MD Inpatient      TOTAL TIME TAKING CARE OF THIS PATIENT: 35 minutes.    Vaughan Basta M.D on 03/06/2018 at 10:21 PM  Between 7am to 6pm - Pager - [phone]  After 6pm go to [email or web address] - password [email or web address]  Office  [phone]  CC: Primary care physician; Birdie Sons, MD   Note: This dictation was prepared with Dragon dictation along with smaller phrase technology. Any transcriptional errors that result from this process are unintentional.

## 2018-03-06 NOTE — Unmapped (Signed)
Labs drawn via [place].  Port flushed and brisk blood return noted.  Pt tolerated well.  Interventions - no intervention needed.  Pt stable and left clinic in wheelchair pushed by female family member.  C/o generalized weakness r/t disease process that her Provider is aware of.  Diane Sis.

## 2018-03-08 NOTE — Unmapped (Signed)
The patient calls the clinic today to inquire about the following:    ?? CBC results from 03/06/2018.  I have informed the patient that when compared to the results taken on 02/27/2018, her counts have improved.    ?? The patient reports that she has received her capecitabine dosing today.  The patient is to start capecitabine therapy as directed by Dr. Claude Manges with her initial dose this evening.    ?? A follow-up clinic visit is tentatively scheduled with Dr. Claude Manges for 03/13/2018 and should that visit need to be changed, our office will contact her with that update.    Otherwise, the patient expressed verbal understanding and had no other questions or concerns at this time and is agreeable to our plan.  Patient is to contact the clinic back sooner should she have any other issues.    No other actions taken and Dr. Claude Manges is aware.

## 2018-03-16 ENCOUNTER — Ambulatory Visit
Admit: 2018-03-16 | Discharge: 2018-03-17 | Payer: MEDICARE | Attending: Hematology & Oncology | Primary: Hematology & Oncology

## 2018-03-16 DIAGNOSIS — C50912 Malignant neoplasm of unspecified site of left female breast: Principal | ICD-10-CM

## 2018-03-16 DIAGNOSIS — Z170 Estrogen receptor positive status [ER+]: Secondary | ICD-10-CM

## 2018-03-16 DIAGNOSIS — G893 Neoplasm related pain (acute) (chronic): Secondary | ICD-10-CM

## 2018-03-16 LAB — COMPREHENSIVE METABOLIC PANEL
ALBUMIN: 2.6 g/dL — ABNORMAL LOW (ref 3.5–5.0)
ALKALINE PHOSPHATASE: 496 U/L — ABNORMAL HIGH (ref 38–126)
ALT (SGPT): 71 U/L — ABNORMAL HIGH (ref 15–48)
ANION GAP: 3 mmol/L — ABNORMAL LOW (ref 9–15)
AST (SGOT): 253 U/L — ABNORMAL HIGH (ref 14–38)
BILIRUBIN TOTAL: 12.2 mg/dL — ABNORMAL HIGH (ref 0.0–1.2)
BLOOD UREA NITROGEN: 9 mg/dL (ref 7–21)
BUN / CREAT RATIO: 15
CALCIUM: 7.9 mg/dL — ABNORMAL LOW (ref 8.5–10.2)
CHLORIDE: 100 mmol/L (ref 98–107)
CO2: 29 mmol/L (ref 22.0–30.0)
CREATININE: 0.6 mg/dL (ref 0.60–1.00)
EGFR MDRD AF AMER: >=60 mL/min/{1.73_m2} (ref >=60)
EGFR MDRD NON AF AMER: >=60 mL/min/{1.73_m2} (ref >=60)
GLUCOSE RANDOM: 99 mg/dL (ref 65–179)
POTASSIUM: 4.8 mmol/L (ref 3.5–5.0)
SODIUM: 132 mmol/L — ABNORMAL LOW (ref 135–145)

## 2018-03-16 LAB — CBC W/ AUTO DIFF
BASOPHILS ABSOLUTE COUNT: 0.1 10*9/L (ref 0.0–0.1)
BASOPHILS RELATIVE PERCENT: 1.8 %
EOSINOPHILS ABSOLUTE COUNT: 0 10*9/L (ref 0.0–0.4)
EOSINOPHILS RELATIVE PERCENT: 0.7 %
HEMATOCRIT: 42.1 % (ref 36.0–46.0)
HEMOGLOBIN: 14 g/dL (ref 13.5–16.0)
LARGE UNSTAINED CELLS: 2 % (ref 0–4)
LYMPHOCYTES ABSOLUTE COUNT: 1.4 10*9/L — ABNORMAL LOW (ref 1.5–5.0)
LYMPHOCYTES RELATIVE PERCENT: 32.1 %
MEAN CORPUSCULAR HEMOGLOBIN CONC: 33.3 g/dL (ref 31.0–37.0)
MEAN CORPUSCULAR HEMOGLOBIN: 33.4 pg (ref 26.0–34.0)
MEAN CORPUSCULAR VOLUME: 100.4 fL — ABNORMAL HIGH (ref 80.0–100.0)
MEAN PLATELET VOLUME: 8.4 fL (ref 7.0–10.0)
MONOCYTES ABSOLUTE COUNT: 0.3 10*9/L (ref 0.2–0.8)
MONOCYTES RELATIVE PERCENT: 6 %
NEUTROPHILS ABSOLUTE COUNT: 2.4 10*9/L (ref 2.0–7.5)
NEUTROPHILS RELATIVE PERCENT: 57 %
PLATELET COUNT: 73 10*9/L — ABNORMAL LOW (ref 150–440)
RED BLOOD CELL COUNT: 4.2 10*12/L (ref 4.00–5.20)

## 2018-03-16 LAB — BUN / CREAT RATIO: Urea nitrogen/Creatinine:MRto:Pt:Ser/Plas:Qn:: 15

## 2018-03-16 LAB — SMEAR REVIEW

## 2018-03-16 LAB — EOSINOPHILS RELATIVE PERCENT: Lab: 0.7

## 2018-03-16 MED ORDER — FENTANYL 25 MCG/HR TRANSDERMAL PATCH
MEDICATED_PATCH | TRANSDERMAL | 0 refills | 0.00000 days | Status: CP
Start: 2018-03-16 — End: 2018-05-07

## 2018-03-16 MED ORDER — ONDANSETRON HCL 8 MG TABLET
ORAL_TABLET | Freq: Three times a day (TID) | ORAL | 5 refills | 0 days | Status: CP | PRN
Start: 2018-03-16 — End: 2018-06-21

## 2018-03-16 NOTE — Unmapped (Signed)
Followup Visit Note    Patient Name: Diane Gonzalez  Patient Age: 42 y.o.  Encounter Date: 03/16/2018    Referring Physician:   Towanda Malkin, MD  [address]  [id number]  [place], [place] [address]    Assessment/Plan:    Reason for Visit  Breast Cancer    Cancer Staging  Breast cancer, left breast (CMS-HCC)  Staging form: Breast, AJCC 7th Edition  - Clinical: ER 98%, PR 92%, HER2 0, not amplified; Gr 2 (Nott 6); BRCA 1/2 w.t. - Unsigned  - Pathologic: Stage IIB (T2, N39mi, cM0) - Signed by Talbert Cage, DO on 01/28/2014        1.  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        -- 07/14/17: PET CT: progression in T1/T5;          -- 08/02/17: Ibrance 125 mg daily        --02/27/2018: PET/CT: Progression in bone and liver and portal nodes; pancytopenia and elevated bilirubin/LFTs: Ibrance/Faslodex/Lupron stopped; start Xeloda 1000 mg/m?? twice daily for 14 days     2.  Status post hysterectomy, ovaries/ tubes intact       3.  History of viral encephalitis with coma, with mild residual neurologic symptoms     4.  Lymphedema     5. Syncope, probable orthostatic; w/u underway with Dr Julio Alm.        -- MRI of CNS without obvious lesion, final radiology report pending  01/19/16        --  EEG with out evidence of seizures.        -- Cardiology evaluation suggests not cardiac in etiology; referred to neurology        -- OSA 06/15/16; recommended positional therapy     6.  Depression; followed by [name];  on Pristiq and Zyprexa; off of gabapentin     7.  Headaches; resolved off of Effexor     8.  H/O meningitis, with brain biopsy       9.  Osteopenia  DEXA   01/19/16     10. [employer]      11. OSA, w/ RLS; recommended positional sleeping, or CPAP      -- 01/27/18: CPAP at 29mmH2) recommended    12. S/p Appendectomy, 05/16/17 [facility])    13. Lumbosacral mediated pain, lumbar DDD, possible sciatica          --  07/17/17: Caudal epidural steroid injection with good pain relief.           Plan:   --Appears to be tolerating Capecitabine, although she also appears to have progressive disease with significant elevation in bilirubin since her last clinic appointment.  I discussed with the patient and her mother possibility that therapy will not be successful, and the possibility that she may become too ill to be a candidate for any salvage chemotherapy.  For the present time, patient would like to continue trial of Xeloda.  I have asked her to return in 1 week for follow-up cell counts,.  --Patient is getting adequate pain relief with fentanyl 25 mcg, and as needed tramadol.  She is not needed very much tramadol.  --Patient is experiencing nausea, but adequate bowel function.  Ondansetron was renewed at 8 mg.               I have reviewed the laboratory, pathology, and radiology reports in detail and discussed findings with the patient.  The patient had a restaging MRI of the abdomen today. Preliminarily, no new lesions noted in the liver.    Interval History:  The patient returns for follow up.  She has been taking Capecitabine  2000 mg twice daily for about 1 week. Her pain has been adequately controlled by adding fentanyl, and she is using very little tramadol.  He has been nauseous, but ondansetron has taken care of this.  Her family has noted that her eyes are a little yellow.  She has not had any bleeding symptoms.  She denies rash, fevers, vomiting, bowel or bladder dysfunction, headaches, visual changes, seizures, dyspnea or cough.           Breast cancer, left breast (CMS-HCC)    11/22/2013 Initial Diagnosis     Breast cancer, left breast; 4.8 cm left breast mass; suspicious nodes.         11/25/2013 Biopsy     IDC, Gr 2; ER +, PR +, HER2 0/not amplified         12/11/2013 -  Cancer Staged     BRCA 1/2 without mutation         12/31/2013 Surgery     left simple mastectomy and SLND: 4.5 cm primary; DCIS 1 of 5 positive node with 1.3 mm deposit.         01/10/2014 Surgery     ALND: 0 of 8 nodes imvolved.         02/13/2014 - 04/17/2014 Chemotherapy     Taxotere/Cyclophosphamide x 4 cycles         05/07/2014 - 06/27/2014 Radiation     33 fractions.         07/18/2014 - 01/24/2015 Chemotherapy     Tamoxifen. Stopped secondary to side effects.         02/01/2015 -  Chemotherapy     Leuprolide and letrozole         03/02/2015 -  Chemotherapy     Anastrozole in place of letrozole, secondary to nausea; not tolerated. Prescription for exemestane written, but not filled.         11/09/2015 -  Chemotherapy     Exemestane started by the patient         11/09/2015 -  Cancer Staged     Chest CT:  Irregular, mottled, and mildly expansile appearance of left seventh posterior rib may represent osseous metastasis. No lytic or blastic lesions are noted in thoracic spine. Mild degenerative changes  of thoracic spine.          11/24/2015 -  Cancer Staged     PET/CT: Nodule in the left liver lobe, corresponds to ring-enhancing lesion present on 11/09/15 chest CT; left seventh rib equivocal for metastatic disease versus fracture. No other significant areas of uptake. 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 -- Two tiny T2 hyperintense lesions in the segment VII and segment VIA ;probable cysts.         02/05/2016 Surgery     Invasive adenocarcinoma, consistent with a metastasis from a breast (ductal) primary (2.5 cm) - Parenchymal resection margin is widely free (2.5 cm to carcinoma) ?????? - Estrogen receptor: Positive (95%, 3+; PR 3+; HER2 0         03/28/2016 -  Chemotherapy     Fulvestrant 500 mg         07/06/2016 -  Cancer Staged     CT [id number]:[id number] sclerotic lesion concerning for metastases in a patient with history of breast cancer. Correlation with nuclear medicine bone scintigraphy is recommended.  -Nodular enhancement in the midline incision as well as in the underlying mesentery is indeterminant and may represent postsurgical change, less likely metastatic disease. Attention on follow-up.         07/15/2016 -  Cancer Staged     MRI L-spine without contrast: Mild degenerative changes in lumbar spine without significant spinal canal or neural foraminal narrowing. No evidence of malignancy.         07/19/2016 -  Cancer Staged     NM Bone Scan:  Persistent uptake in the left posterior seventh rib, likely healing fracture.  - Sclerotic sacral lesion without increased radiotracer uptake.         01/26/2017 -  Cancer Staged     PET-CT:  - Increased uptake at the hepatic resection site, most likely postsurgical change. Recommend clinical correlation. A PET/CT in 3-6 months may be helpful to more fully characterize  - Probable healing fracture in the left seventh rib and treated metastasis in the sacrum. Recommend attention on future scans.          07/06/2017 -  Cancer Staged       MRI L-Spine: Sclerotic lesion at the anterior aspect of S2 with thin rim of peripheral enhancement, suggestive of sclerotic metastasis, unchanged in size from prior study. Additional ill-defined focus of enhancement in the right sacrum adjacent to the S2 neural foramen may also represent a small site of metastasis.  - L5-S1 central disc protrusion with mild central canal narrowing and neural foraminal narrowing.  - Findings suggestive of mild osteitis pubis.  - Left gluteus medius minimus tendinosis/partial tear. Mild right gluteus minimus tendinosis.         07/14/2017 -  Cancer Staged       PET-CT:    Overall progression of disease with new avid FDG uptake in the T1 vertebral body and T5 right pedicle concerning for metastatic disease. Along with interval increase in the FDG uptake in the more focal hepatic lesion when compared to prior imaging.    - Stable lesions in the left  seventh rib and sacrum.                 07/27/2017 -  Chemotherapy     Palbociclib  125 mg for 21 of 28 days.         11/16/2017 -  Cancer Staged       PET/CT:  Overall progression of disease with new osseous metastases in the T8, T11 vertebral body and the right sacrum along with progression of previously identified osseous metastases, including T7.  Note that these areas of uptake are not seen in PACS (visible only on radiology department software), did not have CT scan correlate.    - Interval resolution of the previously noted hepatic lesion adjacent the suture line however there is a new ill-defined hypermetabolic hepatic focus without evidence CT correlation. Recommend attention on follow-up imaging.         12/18/2017 -  Cancer Staged       Bone scan:  Decrease in uptake in the left posterior seventh rib is prior.  -Focal increased uptake at the T10 and L2 vertebral body as above.           02/27/2018 -  Cancer Staged       PET CT:   Overall progression of disease with innumerable new hypermetabolic osseous foci throughout the appendicular and axial skeleton as described above. There is also multiple new hypermetabolic foci remainder of the left hepatic lobe and right hepatic lobe without definite CT correlation as described above.    - Hypermetabolic pericaval lymph node in the abdomen is of unknown significance but may represent an additional site of disease.                    The following portions of the patient's history were reviewed and updated as appropriate: current medications and problem list.    Review of Systems   All other systems reviewed and are negative.      Vital signs for this encounter:  BSA: 2.1 meters squared  BP 128/70  - Pulse 107  - Temp 36.3 ??C (97.3 ??F) (Oral)  - Resp 20  - Wt (!) 101.2 kg (223 lb)  - SpO2 99%  - BMI 40.79 kg/m??      Repeat BP with the manual cuff:  142/98    Physical Exam  Constitutional: She is oriented to person, place, and time. She appears well-developed and well-nourished. No distress.   HENT:   Head: Normocephalic.   Mouth/Throat: Oropharynx is clear and moist. Scant clear drainage in the pharynx  Eyes: EOM are normal. Pupils are equal, round, and reactive to light. Mild jaundice   Neck: No JVD present. No thyromegaly present.   Cardiovascular: Normal rate and normal heart sounds. ??  Pulmonary/Chest: Effort normal and breath sounds normal.   Breast: 03/16/18:??Left chest wall is without nodules or tenderness; there are some areas of scarring, unchanged from previously. The most prominent site measures about 0.5-1 cm, and is lateral to the midclavicular line, just above the scar. The right breast is without suspicious nodules or discharge.   Lymph: No adenopathy in the neck, supraclavicular, axillary or inguinal regions.??  Abdominal: Soft. Liver edge is solid 6 cm below th costal margin with tenderness.  Questionable fluid wave.  Also tender in the bilateral lower quadrants, without mass or swelling, or guarding.  Musculoskeletal: She exhibits no edema. No tenderness to percussion over spine, chest wall,  pelvis or hips. No tenderness to palpation over the  sacrum, or SI joint regions bilaterally.??   Neurological: She is alert and oriented to person, place, and time. No cranial nerve deficit. She exhibits normal muscle tone. Coordination normal.   Skin: Mild candidal skin infection in the groin.   Psychiatric: She has a normal mood and affect. Her behavior is normal.         Karnofsky/Lansky Performance Status  80, Normal activity with effort; some signs or symptoms of disease (ECOG equivalent 1)     Results:    WBC   Date Value Ref Range Status   03/16/2018 4.2 (L) 4.5 - 11.0 10*9/L Final   10/24/2014 5.8 4.5 - 11.0 10*9/L Final     HGB   Date Value Ref Range Status   03/16/2018 14.0 13.5 - 16.0 g/dL Final   82/95/6213 08.6 12.0 - 16.0 g/dL Final     HCT   Date Value Ref Range Status   03/16/2018 42.1 36.0 - 46.0 % Final   10/24/2014 37.7 36.0 - 46.0 % Final     Platelet   Date Value Ref Range Status   03/16/2018 73 (L) 150 - 440 10*9/L Final   10/24/2014 199 150 - 440 10*9/L Final     LDH   Date Value Ref Range Status   02/27/2018 1,408 (H) 338 - 610 U/L Final     Creatinine Whole Blood, POC   Date Value Ref Range Status   07/06/2016 0.4 (L) 0.7 - 1.1 mg/dL Final     Creatinine   Date Value Ref Range Status   03/16/2018 0.60 0.60 - 1.00 mg/dL Final     AST   Date Value Ref Range Status   03/16/2018 253 (H) 14 - 38 U/L Final   01/22/2015 29 14 - 38 U/L Final     CEA   Date Value Ref Range Status   12/29/2015 1.5 0.0 - 5.0 ng/mL Final   02/06/2014 <0.5 0.0 - 5.0 ng/mL Final     AFP-Tumor Marker   Date Value Ref Range Status   12/29/2015 2.01 <7.51 ng/mL Final

## 2018-03-16 NOTE — Unmapped (Addendum)
--   Continue taking capecitabine 2000 mg twice daily, to complete 14 days of treatment.  --Let us know if any new toxicities arise.  --I would like to see you back on March 22, 2018, as I will be away the following week.  If your condition looks relatively stable, we will plan to restart the next cycle of therapy after a 1 week rest.  --

## 2018-03-19 MED ORDER — LIOTHYRONINE 25 MCG TABLET
ORAL_TABLET | 1 refills | 0 days | Status: SS
Start: 2018-03-19 — End: 2018-04-19

## 2018-03-19 NOTE — Unmapped (Signed)
[facility] Refill and Clinical Coordination Note  Medication(s): capecitabine 500mg     Diane Gonzalez, DOB: July 16, 1976  Phone: [phone] (home) [phone] (work), Alternate phone contact: N/A  Shipping address: [address]  [place] [address]  Phone or address changes today?: No  All above HIPAA information verified.  Insurance changes? No    Completed refill and clinical call assessment today to schedule patient's medication shipment from the [facility] [phone]).      MEDICATION RECONCILIATION    Confirmed the medication and dosage are correct and have not changed: Yes, regimen is correct and unchanged.    Were there any changes to your medication(s) in the past month:  No, there are no changes reported at this time.    ADHERENCE    Is this medicine transplant or covered by Medicare Part B? Yes.    Capecitabine 500 mg   Quantity filled last month: 112   # of tablets left on hand: 0    Did you miss any doses in the past 4 weeks? No missed doses reported.  Adherence counseling provided? Not needed     SIDE EFFECT MANAGEMENT    Are you tolerating your medication?:  Diane Gonzalez reports tolerating the medication.  Side effect management discussed: None      Therapy is appropriate and should be continued.    Evidence of clinical benefit: See Epic note from 03/16/18      FINANCIAL/SHIPPING    Delivery Scheduled: Yes, Expected medication delivery date: 03/23/18   Additional medications refilled: No additional medications/refills needed at this time.    The patient will receive an FSI print out for each medication shipped and additional FDA Medication Guides as required.  Patient education from Diane Gonzalez or Diane Gonzalez may also be included in the shipment.    Diane Gonzalez did not have any additional questions at this time.    Delivery address validated in FSI scheduling system: Yes, address listed above is correct.      We will follow up with patient monthly for standard refill processing and delivery.      Thank you,  Diane Gonzalez   [facility] Specialty Pharmacist

## 2018-03-22 ENCOUNTER — Ambulatory Visit
Admit: 2018-03-22 | Discharge: 2018-03-22 | Payer: MEDICARE | Attending: Hematology & Oncology | Primary: Hematology & Oncology

## 2018-03-22 DIAGNOSIS — C50912 Malignant neoplasm of unspecified site of left female breast: Principal | ICD-10-CM

## 2018-03-22 DIAGNOSIS — Z170 Estrogen receptor positive status [ER+]: Secondary | ICD-10-CM

## 2018-03-22 DIAGNOSIS — C50919 Malignant neoplasm of unspecified site of unspecified female breast: Secondary | ICD-10-CM

## 2018-03-22 LAB — CBC W/ AUTO DIFF
BASOPHILS ABSOLUTE COUNT: 0.1 10*9/L (ref 0.0–0.1)
EOSINOPHILS ABSOLUTE COUNT: 0.1 10*9/L (ref 0.0–0.4)
EOSINOPHILS RELATIVE PERCENT: 1.8 %
HEMATOCRIT: 38.1 % (ref 36.0–46.0)
HEMOGLOBIN: 12.5 g/dL — ABNORMAL LOW (ref 13.5–16.0)
LARGE UNSTAINED CELLS: 3 % (ref 0–4)
LYMPHOCYTES ABSOLUTE COUNT: 0.8 10*9/L — ABNORMAL LOW (ref 1.5–5.0)
LYMPHOCYTES RELATIVE PERCENT: 17.7 %
MEAN CORPUSCULAR HEMOGLOBIN CONC: 32.9 g/dL (ref 31.0–37.0)
MEAN CORPUSCULAR HEMOGLOBIN: 32.5 pg (ref 26.0–34.0)
MEAN CORPUSCULAR VOLUME: 99 fL (ref 80.0–100.0)
MEAN PLATELET VOLUME: 8.3 fL (ref 7.0–10.0)
MONOCYTES ABSOLUTE COUNT: 0.1 10*9/L — ABNORMAL LOW (ref 0.2–0.8)
MONOCYTES RELATIVE PERCENT: 1.3 %
NEUTROPHILS ABSOLUTE COUNT: 3.3 10*9/L (ref 2.0–7.5)
NEUTROPHILS RELATIVE PERCENT: 75.4 %
NUCLEATED RED BLOOD CELLS: 0 /100{WBCs} (ref <=4)
PLATELET COUNT: 86 10*9/L — ABNORMAL LOW (ref 150–440)
RED CELL DISTRIBUTION WIDTH: 20 % — ABNORMAL HIGH (ref 12.0–15.0)
WBC ADJUSTED: 4.4 10*9/L — ABNORMAL LOW (ref 4.5–11.0)

## 2018-03-22 LAB — SMEAR REVIEW

## 2018-03-22 LAB — LACTATE DEHYDROGENASE: Lactate dehydrogenase:CCnc:Pt:Ser/Plas:Qn:: 1905 — ABNORMAL HIGH

## 2018-03-22 LAB — HEPATIC FUNCTION PANEL
ALBUMIN: 2.2 g/dL — ABNORMAL LOW (ref 3.5–5.0)
ALKALINE PHOSPHATASE: 512 U/L (ref 38–126)
ALT (SGPT): 49 U/L — ABNORMAL HIGH (ref 15–48)
AST (SGOT): 142 U/L — ABNORMAL HIGH (ref 14–38)
BILIRUBIN DIRECT: 10.2 mg/dL — ABNORMAL HIGH (ref 0.00–0.40)

## 2018-03-22 LAB — ALBUMIN
Albumin:MCnc:Pt:Ser/Plas:Qn:: 2.2 — ABNORMAL LOW
Albumin:MCnc:Pt:Ser/Plas:Qn:: 2.2 — ABNORMAL LOW

## 2018-03-22 LAB — PHOSPHORUS
PHOSPHORUS: 2.1 mg/dL — ABNORMAL LOW (ref 2.9–4.7)
Phosphate:MCnc:Pt:Ser/Plas:Qn:: 2.1 — ABNORMAL LOW

## 2018-03-22 LAB — SLIDE REVIEW

## 2018-03-22 LAB — RED BLOOD CELL COUNT: Lab: 3.85 — ABNORMAL LOW

## 2018-03-22 LAB — CREATININE
Creatinine:MCnc:Pt:Ser/Plas:Qn:: 0.45 — ABNORMAL LOW
EGFR MDRD AF AMER: >=60 mL/min/{1.73_m2} (ref >=60)

## 2018-03-22 LAB — CALCIUM: Calcium:MCnc:Pt:Ser/Plas:Qn:: 7.9 — ABNORMAL LOW

## 2018-03-22 MED ORDER — MAGIC MOUTHWASH (NYSTATIN/DIPHENHYDRAMINE/MYLANTA) ORAL MIXTURE
Freq: Four times a day (QID) | ORAL | 2 refills | 0.00000 days | Status: CP | PRN
Start: 2018-03-22 — End: 2018-04-24

## 2018-03-22 MED ORDER — CAPECITABINE 500 MG TABLET
3 refills | 0 days
Start: 2018-03-22 — End: 2018-03-22

## 2018-03-22 MED FILL — CAPECITABINE/500MG/TABS: CAPECITABINE/500MG/TABS | 21 days supply | Qty: 112 | Fill #1

## 2018-03-22 NOTE — Unmapped (Signed)
Followup Visit Note    Patient Name: Diane Gonzalez  Patient Age: 42 y.o.  Encounter Date: 03/22/2018    Referring Physician:   Towanda Malkin, MD  [address]  [address] [id number]  [place], [place] [address]    Assessment/Plan:    Reason for Visit  Breast Cancer    Cancer Staging  Breast cancer, left breast (CMS-HCC)  Staging form: Breast, AJCC 7th Edition  - Clinical: ER 98%, PR 92%, HER2 0, not amplified; Gr 2 (Nott 6); BRCA 1/2 w.t. - Unsigned  - Pathologic: Stage IIB (T2, N65mi, cM0) - Signed by Talbert Cage, DO on 01/28/2014        1.  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        -- 07/14/17: PET CT: progression in T1/T5;          -- 08/02/17: Ibrance 125 mg daily        --02/27/2018: PET/CT: Progression in bone and liver and portal nodes; pancytopenia and elevated bilirubin/LFTs: Ibrance/Faslodex/Lupron stopped; start Xeloda 1000 mg/m?? twice daily for 14 days     2.  Status post hysterectomy, ovaries/ tubes intact       3.  History of viral encephalitis with coma, with mild residual neurologic symptoms     4.  Lymphedema     5. Syncope, probable orthostatic; w/u underway with Dr Julio Alm.        -- MRI of CNS without obvious lesion, final radiology report pending  01/19/16        --  EEG with out evidence of seizures.        -- Cardiology evaluation suggests not cardiac in etiology; referred to neurology        -- OSA 06/15/16; recommended positional therapy     6.  Depression; followed by [name];  on Pristiq and Zyprexa; off of gabapentin     7.  Headaches; resolved off of Effexor     8.  H/O meningitis, with brain biopsy       9.  Osteopenia  DEXA   01/19/16     10. [employer]      11. OSA, w/ RLS; recommended positional sleeping, or CPAP      -- 01/27/18: CPAP at 63mmH2) recommended    12. S/p Appendectomy, 05/16/17 [facility])    13. Lumbosacral mediated pain, lumbar DDD, possible sciatica          --  07/17/17: Caudal epidural steroid injection with good pain relief.    14.  Acral erythema secondary to capecitabine,            Plan:   --Patient has tolerated the first cycle of Xgeva at 2000 mg twice daily, although has developed some significant skin toxicity on her feet in the last day or 2 of therapy.  --Patient will be off treatment until at least April 25, may need to take a few additional days until skin changes have resolved.  --There is some evidence of response with improved pain, decreased bilirubin and transaminases.  --Cycle 2 will be at 1800 mg twice daily for 14 days.  --A prescription for Magic mouthwash was sent.  --Discussed strategies to ameliorate skin toxicity including avoidance of damaging agents, Udder Cream, wearing double socks, and being certain to go only with shoes that fit.            I have reviewed the laboratory, pathology, and radiology reports in detail and discussed findings with the patient.  The patient had a restaging MRI of the abdomen today. Preliminarily, no new lesions noted in the liver.    Interval History:  The patient returns for follow up.  She has been taking Capecitabine  2000 mg twice daily for about 1 week. Her pain has been adequately controlled by adding fentanyl, and she is using very little tramadol.  He has been nauseous, but ondansetron has taken care of this.  Her family has noted that her eyes are a little yellow.  She has not had any bleeding symptoms.  She denies rash, fevers, vomiting, bowel or bladder dysfunction, headaches, visual changes, seizures, dyspnea or cough.           Breast cancer, left breast (CMS-HCC)    11/22/2013 Initial Diagnosis     Breast cancer, left breast; 4.8 cm left breast mass; suspicious nodes.         11/25/2013 Biopsy     IDC, Gr 2; ER +, PR +, HER2 0/not amplified         12/11/2013 -  Cancer Staged     BRCA 1/2 without mutation         12/31/2013 Surgery     left simple mastectomy and SLND: 4.5 cm primary; DCIS 1 of 5 positive node with 1.3 mm deposit.         01/10/2014 Surgery     ALND: 0 of 8 nodes imvolved.         02/13/2014 - 04/17/2014 Chemotherapy     Taxotere/Cyclophosphamide x 4 cycles         05/07/2014 - 06/27/2014 Radiation     33 fractions.         07/18/2014 - 01/24/2015 Chemotherapy     Tamoxifen. Stopped secondary to side effects.         02/01/2015 -  Chemotherapy     Leuprolide and letrozole         03/02/2015 -  Chemotherapy     Anastrozole in place of letrozole, secondary to nausea; not tolerated. Prescription for exemestane written, but not filled.         11/09/2015 -  Chemotherapy     Exemestane started by the patient         11/09/2015 -  Cancer Staged     Chest CT:  Irregular, mottled, and mildly expansile appearance of left seventh posterior rib may represent osseous metastasis. No lytic or blastic lesions are noted in thoracic spine.  Mild degenerative changes of thoracic spine.          11/24/2015 -  Cancer Staged     PET/CT: Nodule in the left liver lobe, corresponds to ring-enhancing lesion present on 11/09/15 chest CT; left seventh rib equivocal for metastatic disease versus fracture. No other significant areas of uptake. 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 -- Two tiny T2 hyperintense lesions in the segment VII and segment VIA ;probable cysts.         02/05/2016 Surgery     Invasive adenocarcinoma, consistent with a metastasis from a breast (ductal) primary (2.5 cm) - Parenchymal resection margin is widely free (2.5 cm to carcinoma) ?????? - Estrogen receptor: Positive (95%, 3+; PR 3+; HER2 0         03/28/2016 -  Chemotherapy     Fulvestrant 500 mg         07/06/2016 -  Cancer Staged     CT ZOX:[id number] sclerotic lesion concerning for metastases in a patient with history of breast cancer. Correlation with nuclear medicine bone scintigraphy is recommended.  -Nodular enhancement in the midline incision as well as in the underlying mesentery is indeterminant and may represent postsurgical change, less likely metastatic disease. Attention on follow-up.         07/15/2016 -  Cancer Staged     MRI L-spine without contrast: Mild degenerative changes in lumbar spine without significant spinal canal or neural foraminal narrowing. No evidence of malignancy.         07/19/2016 -  Cancer Staged     NM Bone Scan:  Persistent uptake in the left posterior seventh rib, likely healing fracture.  - Sclerotic sacral lesion without increased radiotracer uptake.         01/26/2017 -  Cancer Staged     PET-CT:  - Increased uptake at the hepatic resection site, most likely postsurgical change. Recommend clinical correlation. A PET/CT in 3-6 months may be helpful to more fully characterize  - Probable healing fracture in the left seventh rib and treated metastasis in the sacrum. Recommend attention on future scans.          07/06/2017 -  Cancer Staged       MRI L-Spine: Sclerotic lesion at the anterior aspect of S2 with thin rim of peripheral enhancement, suggestive of sclerotic metastasis, unchanged in size from prior study. Additional ill-defined focus of enhancement in the right sacrum adjacent to the S2 neural foramen may also represent a small site of metastasis.  - L5-S1 central disc protrusion with mild central canal narrowing and neural foraminal narrowing.  - Findings suggestive of mild osteitis pubis.  - Left gluteus medius minimus tendinosis/partial tear. Mild right gluteus minimus tendinosis.         07/14/2017 -  Cancer Staged       PET-CT:    Overall progression of disease with new avid FDG uptake in the T1 vertebral body and T5 right pedicle concerning for metastatic disease. Along with interval increase in the FDG uptake in the more focal hepatic lesion when compared to prior imaging.    - Stable lesions  in the left seventh rib and sacrum.                 07/27/2017 -  Chemotherapy     Palbociclib  125 mg for 21 of 28 days.         11/16/2017 -  Cancer Staged       PET/CT:  Overall progression of disease with new osseous metastases in the T8, T11 vertebral body and the right sacrum along with progression of previously identified osseous metastases, including T7.  Note that these areas of uptake are not seen in PACS (visible only on radiology department software), did not have CT scan correlate.    - Interval resolution of the previously noted hepatic lesion adjacent the suture line however there is a new ill-defined hypermetabolic hepatic focus without evidence CT correlation. Recommend attention on follow-up imaging.         12/18/2017 -  Cancer Staged       Bone scan:  Decrease in uptake in the left posterior seventh rib is prior.  -Focal increased uptake at the T10 and L2 vertebral body as above.           02/27/2018 -  Cancer Staged       PET CT:   Overall progression of disease with innumerable new hypermetabolic osseous foci throughout the appendicular and axial skeleton as described above. There is also multiple new hypermetabolic foci remainder of the left hepatic lobe and right hepatic lobe without definite CT correlation as described above.    - Hypermetabolic pericaval lymph node in the abdomen is of unknown significance but may represent an additional site of disease.                    The following portions of the patient's history were reviewed and updated as appropriate: current medications and problem list.    Review of Systems   All other systems reviewed and are negative.      Vital signs for this encounter:  BSA: 2.11 meters squared  BP 126/76  - Pulse 120  - Temp 36.2 ??C (97.2 ??F) (Temporal)  - Wt (!) 102.2 kg (225 lb 6.4 oz)  - SpO2 98%  - BMI 41.23 kg/m??      Repeat BP with the manual cuff:  142/98    Physical Exam  Constitutional: She is oriented to person, place, and time. She appears well-developed and well-nourished. No distress.   HENT:   Head: Normocephalic.   Mouth/Throat: Oropharynx is clear and moist. Scant clear drainage in the pharynx  Eyes: EOM are normal. Pupils are equal, round, and reactive to light. Mild jaundice   Neck: No JVD present. No thyromegaly present.   Cardiovascular: Normal rate and normal heart sounds. ??  Pulmonary/Chest: Effort normal and breath sounds normal.   Breast: 03/16/18:??Left chest wall is without nodules or tenderness; there are some areas of scarring, unchanged from previously. The most prominent site measures about 0.5-1 cm, and is lateral to the midclavicular line, just above the scar. The right breast is without suspicious nodules or discharge.   Lymph: No adenopathy in the neck, supraclavicular, axillary or inguinal regions.??  Abdominal: Soft.  Abdomen was obese, and without palpable organomegaly or tenderness today on exam.  Bowel sounds were normal.  Musculoskeletal: She exhibits no edema. No tenderness to percussion over spine, chest wall,  pelvis or hips. No tenderness to palpation over the sacrum, or SI joint regions bilaterally.??   Neurological: She is  alert and oriented to person, place, and time. No cranial nerve deficit. She exhibits normal muscle tone. Coordination normal.   Skin: Skin on the soles of her feet, toes, sides of the feet are erythematous, mildly tender and mildly warm.  There is dry skin on her hands, with slightly erythematous skin on the palms..   Psychiatric: She has a normal mood and affect. Her behavior is normal.         Karnofsky/Lansky Performance Status  80, Normal activity with effort; some signs or symptoms of disease (ECOG equivalent 1)     Results:    WBC   Date Value Ref Range Status   03/22/2018 4.4 (L) 4.5 - 11.0 10*9/L Final   10/24/2014 5.8 4.5 - 11.0 10*9/L Final     HGB   Date Value Ref Range Status   03/22/2018 12.5 (L) 13.5 - 16.0 g/dL Final   16/09/9603 54.0 12.0 - 16.0 g/dL Final     HCT   Date Value Ref Range Status   03/22/2018 38.1 36.0 - 46.0 % Final   10/24/2014 37.7 36.0 - 46.0 % Final     Platelet   Date Value Ref Range Status   03/22/2018 86 (L) 150 - 440 10*9/L Final   10/24/2014 199 150 - 440 10*9/L Final     LDH   Date Value Ref Range Status   03/22/2018 1,905 (H) 338 - 610 U/L Final     Creatinine Whole Blood, POC   Date Value Ref Range Status   07/06/2016 0.4 (L) 0.7 - 1.1 mg/dL Final     Creatinine   Date Value Ref Range Status   03/22/2018 0.45 (L) 0.60 - 1.00 mg/dL Final     AST   Date Value Ref Range Status   03/22/2018 142 (H) 14 - 38 U/L Final   01/22/2015 29 14 - 38 U/L Final     CEA   Date Value Ref Range Status   12/29/2015 1.5 0.0 - 5.0 ng/mL Final   02/06/2014 <0.5 0.0 - 5.0 ng/mL Final     AFP-Tumor Marker   Date Value Ref Range Status   12/29/2015 2.01 <7.51 ng/mL Final

## 2018-03-22 NOTE — Unmapped (Signed)
Here to fu with dr Claude Manges. Having intractable vomiting. Tachycardic and actively vomiting.   Lips are sore and painful, Feet are painful bilaterally.   Labs drawn via [place].    Port flushed and brisk blood return     Pt tolerated well.    1Liter of NS hung runing at 735ml/hr via port.

## 2018-03-23 NOTE — Unmapped (Signed)
[facility] Medication Referral: No PA required    Medication (Brand/Generic): Capecitabine 500mg  and 150mg     Initial FSI Test Claim completed with resulted information below:  No PA required  Patient ABLE to fill at [facility]  Insurance Company:  Medicare Part B  Anticipated Copay: $0    As Co-pay is under $100 defined limit, per policy there will be no further investigation of need for financial assistance at this time unless patient requests. This referral has been communicated to the provider and handed off to the [facility] team for further processing and filling of prescribed medication.   ______________________________________________________________________  Please utilize this referral for viewing purposes as it will serve as the central location for all relevant documentation and updates.

## 2018-03-23 NOTE — Unmapped (Signed)
[facility] Refill and Clinical Coordination Note  Medication(s): capeciatbine 150mg     Diane Gonzalez, DOB: 21-Jul-1976  Phone: [phone] (home) [phone] (work), Alternate phone contact: N/A  Shipping address: [address]  [name] [address]  Phone or address changes today?: No  All above HIPAA information verified.  Insurance changes? No    Completed refill and clinical call assessment today to schedule patient's medication shipment from the [facility] [phone]).      MEDICATION RECONCILIATION    Confirmed the medication and dosage are correct and have not changed: No, patient reports changes to the regimen as follows: new dose is 1800mg  po bid, 2 of the 150mg  tabs and 3 of the 500mg  tabs per dose... pt understands and is expecting the new dose    Were there any changes to your medication(s) in the past month:  No, there are no changes reported at this time.    ADHERENCE    Is this medicine transplant or covered by Medicare Part B? Yes.    Capecitabine 150 mg   Quantity filled last month: 0   # of tablets left on hand: 0      Did you miss any doses in the past 4 weeks? No missed doses reported.  Adherence counseling provided? Not needed     SIDE EFFECT MANAGEMENT    Are you tolerating your medication?:  Diane Gonzalez reports tolerating the medication.  Side effect management discussed: None      Therapy is appropriate and should be continued.    Evidence of clinical benefit: See Epic note from 03/22/18      FINANCIAL/SHIPPING    Delivery Scheduled: Yes, Expected medication delivery date: 03/16/18   Additional medications refilled: No additional medications/refills needed at this time.    The patient will receive an FSI print out for each medication shipped and additional FDA Medication Guides as required.  Patient education from Diane Gonzalez or Diane Gonzalez may also be included in the shipment.    Diane Gonzalez did not have any additional questions at this time.    Delivery address validated in FSI scheduling system: Yes, address listed above is correct.      We will follow up with patient monthly for standard refill processing and delivery.      Thank you,  Lupita Shutter   [facility] Specialty Pharmacist

## 2018-03-26 MED FILL — CAPECITABINE/150MG/TABS: CAPECITABINE/150MG/TABS | 21 days supply | Qty: 56 | Fill #0

## 2018-03-29 MED ORDER — CAPECITABINE 150 MG TABLET
ORAL_TABLET | Freq: Two times a day (BID) | ORAL | 3 refills | 0.00000 days | Status: CP
Start: 2018-03-29 — End: 2018-04-24

## 2018-03-29 MED ORDER — CAPECITABINE 500 MG TABLET
ORAL_TABLET | Freq: Two times a day (BID) | ORAL | 3 refills | 0 days | Status: CP
Start: 2018-03-29 — End: 2018-04-24

## 2018-03-29 NOTE — Unmapped (Signed)
Fax received from [facility] pharmacy requesting medication refill of Aripiprazole 5 mg tablet. Rx pended and ready to be signed.

## 2018-04-03 MED ORDER — ARIPIPRAZOLE 5 MG TABLET
ORAL_TABLET | Freq: Every day | ORAL | 0 refills | 0 days | Status: CP
Start: 2018-04-03 — End: 2018-04-24

## 2018-04-05 ENCOUNTER — Ambulatory Visit
Admit: 2018-04-05 | Discharge: 2018-04-05 | Payer: MEDICARE | Attending: Hematology & Oncology | Primary: Hematology & Oncology

## 2018-04-05 DIAGNOSIS — C50912 Malignant neoplasm of unspecified site of left female breast: Secondary | ICD-10-CM

## 2018-04-05 DIAGNOSIS — Z171 Estrogen receptor negative status [ER-]: Secondary | ICD-10-CM

## 2018-04-05 DIAGNOSIS — R601 Generalized edema: Secondary | ICD-10-CM

## 2018-04-05 DIAGNOSIS — R7309 Other abnormal glucose: Secondary | ICD-10-CM

## 2018-04-05 DIAGNOSIS — D649 Anemia, unspecified: Secondary | ICD-10-CM

## 2018-04-05 DIAGNOSIS — R609 Edema, unspecified: Secondary | ICD-10-CM

## 2018-04-05 DIAGNOSIS — C50919 Malignant neoplasm of unspecified site of unspecified female breast: Secondary | ICD-10-CM

## 2018-04-05 DIAGNOSIS — C50112 Malignant neoplasm of central portion of left female breast: Principal | ICD-10-CM

## 2018-04-05 LAB — SLIDE REVIEW

## 2018-04-05 LAB — BACTERIA

## 2018-04-05 LAB — URINALYSIS
BLOOD UA: NEGATIVE
GLUCOSE UA: 250 — AB
LEUKOCYTE ESTERASE UA: NEGATIVE
NITRITE UA: NEGATIVE
PH UA: 6 (ref 5.0–9.0)
RBC UA: 0 /HPF (ref <4)
SPECIFIC GRAVITY UA: 1.025 (ref 1.005–1.040)
SQUAMOUS EPITHELIAL: 5 /HPF (ref 0–5)
UROBILINOGEN UA: 4 — AB
WBC UA: 3 /HPF (ref 0–5)

## 2018-04-05 LAB — CBC W/ AUTO DIFF
BASOPHILS ABSOLUTE COUNT: 0 10*9/L (ref 0.0–0.1)
BASOPHILS RELATIVE PERCENT: 1 %
EOSINOPHILS ABSOLUTE COUNT: 0.2 10*9/L (ref 0.0–0.4)
EOSINOPHILS RELATIVE PERCENT: 3.7 %
HEMATOCRIT: 26.8 % — ABNORMAL LOW (ref 36.0–46.0)
HEMOGLOBIN: 9 g/dL — ABNORMAL LOW (ref 13.5–16.0)
LARGE UNSTAINED CELLS: 4 % (ref 0–4)
LYMPHOCYTES RELATIVE PERCENT: 21.6 %
MEAN CORPUSCULAR HEMOGLOBIN CONC: 33.6 g/dL (ref 31.0–37.0)
MEAN CORPUSCULAR VOLUME: 102.7 fL — ABNORMAL HIGH (ref 80.0–100.0)
MEAN PLATELET VOLUME: 8.4 fL (ref 7.0–10.0)
MONOCYTES ABSOLUTE COUNT: 0.2 10*9/L (ref 0.2–0.8)
MONOCYTES RELATIVE PERCENT: 5 %
NEUTROPHILS ABSOLUTE COUNT: 2.8 10*9/L (ref 2.0–7.5)
NEUTROPHILS RELATIVE PERCENT: 64.5 %
RED BLOOD CELL COUNT: 2.61 10*12/L — ABNORMAL LOW (ref 4.00–5.20)
RED CELL DISTRIBUTION WIDTH: 23.7 % — ABNORMAL HIGH (ref 12.0–15.0)
WBC ADJUSTED: 4.4 10*9/L — ABNORMAL LOW (ref 4.5–11.0)

## 2018-04-05 LAB — MAGNESIUM: Magnesium:MCnc:Pt:Ser/Plas:Qn:: 1.8

## 2018-04-05 LAB — RETICULOCYTE ABSOLUTE COUNT: Lab: 73.4

## 2018-04-05 LAB — HEPATIC FUNCTION PANEL
ALBUMIN: 2 g/dL — ABNORMAL LOW (ref 3.5–5.0)
ALKALINE PHOSPHATASE: 421 U/L (ref 38–126)
ALT (SGPT): 48 U/L (ref 15–48)
AST (SGOT): 99 U/L — ABNORMAL HIGH (ref 14–38)
BILIRUBIN DIRECT: 5.9 mg/dL — ABNORMAL HIGH (ref 0.00–0.40)

## 2018-04-05 LAB — RETICULOCYTES: RETIC HGB CONTENT: 35.7 pg (ref 29.7–36.1)

## 2018-04-05 LAB — IRON & TIBC
IRON SATURATION (CALC): 94 % — ABNORMAL HIGH (ref 15–50)
IRON: 156 ug/dL (ref 35–165)

## 2018-04-05 LAB — SMEAR REVIEW

## 2018-04-05 LAB — HEMATOCRIT: Lab: 26.8 — ABNORMAL LOW

## 2018-04-05 LAB — HAPTOGLOBIN: Haptoglobin:MCnc:Pt:Ser/Plas:Qn:: <20 — ABNORMAL LOW

## 2018-04-05 LAB — GLUCOSE RANDOM: Glucose:MCnc:Pt:Ser/Plas:Qn:: 122

## 2018-04-05 LAB — EGFR MDRD AF AMER: Glomerular filtration rate/1.73 sq M.predicted.black:ArVRat:Pt:Ser/Plas/Bld:Qn:Creatinine-based formula (MDRD): >=60

## 2018-04-05 LAB — ALBUMIN: Albumin:MCnc:Pt:Ser/Plas:Qn:: 2 — ABNORMAL LOW

## 2018-04-05 LAB — LACTATE DEHYDROGENASE: Lactate dehydrogenase:CCnc:Pt:Ser/Plas:Qn:: 1512 — ABNORMAL HIGH

## 2018-04-05 LAB — PRO-BNP: Natriuretic peptide.B prohormone N-Terminal:MCnc:Pt:Ser/Plas:Qn:: 31.8

## 2018-04-05 LAB — CREATININE: EGFR MDRD AF AMER: >=60 mL/min/{1.73_m2} (ref >=60)

## 2018-04-05 LAB — FERRITIN: Ferritin:MCnc:Pt:Ser/Plas:Qn:: 464 — ABNORMAL HIGH

## 2018-04-05 LAB — TOTAL IRON BINDING CAPACITY (CALC): Lab: 166.3 — ABNORMAL LOW

## 2018-04-05 LAB — POTASSIUM: Potassium:SCnc:Pt:Ser/Plas:Qn:: 4.1

## 2018-04-05 MED ORDER — OMEPRAZOLE 20 MG CAPSULE,DELAYED RELEASE
ORAL_CAPSULE | Freq: Every day | ORAL | 3 refills | 0.00000 days | Status: CP
Start: 2018-04-05 — End: 2018-04-24

## 2018-04-05 MED ORDER — BLOOD-GLUCOSE METER KIT
0 refills | 0 days | Status: CP
Start: 2018-04-05 — End: 2018-04-24

## 2018-04-05 MED ORDER — FUROSEMIDE 40 MG TABLET
ORAL_TABLET | Freq: Every day | ORAL | 11 refills | 0 days | Status: CP
Start: 2018-04-05 — End: 2018-04-24

## 2018-04-05 MED ORDER — SPIRONOLACTONE 25 MG TABLET
ORAL_TABLET | Freq: Two times a day (BID) | ORAL | 11 refills | 0 days | Status: CP
Start: 2018-04-05 — End: 2018-04-24

## 2018-04-05 NOTE — Unmapped (Addendum)
--  Start on furosemide 40 mg each morning, and spironolactone 25 mg twice a day.  If you do not mobilize at least 2 pounds of fluid daily, contact me.  Conversely, if you are mobilizing so much fluid that your left lightheaded or weak, also contact me.  --Placed to have your bed on 4 to 6 inch blocks.  Continue taking ranitidine 300 mg at bedtime, but also add omeprazole 20 mg in the morning (a prescription has been sent to your pharmacy).  Omeprazole can sometimes increase the metabolism of the chemotherapy pill, and lead to somewhat less effective serum levels.  So, if your reflux symptoms are well controlled, hold on the omeprazole if it is not needed, and also hold if it is not helping.  --Blood sugar measurement is pending, but there is sugar in your urine suggesting that you have had elevated levels in the bloodstream.  If possible, monitor your fasting blood sugar at home; let me know if the levels exceed 400, and record the levels and bring them with you when you next are seen in clinic.  --Complete the next plan week of capecitabine (Xeloda) 1800 mg twice daily if possible.  I will plan to see her back in 2 weeks with a restaging CT scan.

## 2018-04-05 NOTE — Unmapped (Addendum)
Followup Visit Note    Patient Name: Diane Gonzalez  Patient Age: 42 y.o.  Encounter Date: 04/05/2018    Referring Physician:   Towanda Malkin, MD  [address]  [address] [facility] [id number]  [place], [place] [address]    Assessment/Plan:    Reason for Visit  Breast Cancer    Cancer Staging  Breast cancer, left breast (CMS-HCC)  Staging form: Breast, AJCC 7th Edition  - Clinical: ER 98%, PR 92%, HER2 0, not amplified; Gr 2 (Nott 6); BRCA 1/2 w.t. - Unsigned  - Pathologic: Stage IIB (T2, N74mi, cM0) - Signed by Talbert Cage, DO on 01/28/2014        1.  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        -- 07/14/17: PET CT: progression in T1/T5;          -- 08/02/17: Ibrance 125 mg daily        --02/27/2018: PET/CT: Progression in bone and liver and portal nodes; pancytopenia and elevated bilirubin/LFTs: Ibrance/Faslodex/Lupron stopped; start Xeloda 1000 mg/m?? twice daily for 14 days     2.  Status post hysterectomy, ovaries/ tubes intact       3.  History of viral encephalitis with coma, with mild residual neurologic symptoms     4.  Lymphedema     5. Syncope, probable orthostatic; w/u underway with Dr Julio Alm.        -- MRI of CNS without obvious lesion, final radiology report pending  01/19/16        --  EEG with out evidence of seizures.        -- Cardiology evaluation suggests not cardiac in etiology; referred to neurology        -- OSA 06/15/16; recommended positional therapy     6.  Depression; followed by [name];  on Pristiq and Zyprexa; off of gabapentin     7.  Headaches; resolved off of Effexor     8.  H/O meningitis, with brain biopsy       9.  Osteopenia  DEXA   01/19/16     10. [employer]      11. OSA, w/ RLS; recommended positional sleeping, or CPAP      -- 01/27/18: CPAP at 38mmH2) recommended    12. S/p Appendectomy, 05/16/17 [facility])    13. Lumbosacral mediated pain, lumbar DDD, possible sciatica          --  07/17/17: Caudal epidural steroid injection with good pain relief.    14.  Acral erythema secondary to capecitabine,            Plan:   --  The patient is tolerating cycle #2 week #1 of Xgeva at 1800 mg BID.  Plan to complete the second week.    --  Note that the platelet count has now normalized, but the Hgb has declined significantly, for unclear reasons. Retic, haptoglobin, ferritin, iron/TIBC pending.  -- The albumin is declining, at 2.0 g/dL now, with evidence of dermal and lower extremity edema, below the level of the diaphragm, and probable ascites.  -- The etiology of the fluid retention is not entirely clear.  She has evidence of improving disease as based on improving LFTs/LDH/Pain/anorexia  -- Will arrange for furosemide 40 mg daily, spironolactone 25 mg BID; check pro-BNP; encourage increased protein intake  -- Add omeprazole 20 mg in the a.m.  -- Schedule restaging CT scan on return in 2 weeks,and prior to cycle # 3.  -- Glucose is in the urine; will check BS and Hgb A1c; the patient will monitor FBS at home, and decrease candy consumption.      I have reviewed the laboratory, pathology, and radiology reports in detail and discussed findings with the patient.  The patient had a restaging MRI of the abdomen today. Preliminarily, no new lesions noted in the liver.    Interval History:  The patient returns for follow up.  She has been taking cycle #2 Capecitabine  1800 mg twice daily for about 1 week.  Been using Udder cream which ihas helped dry skin, particularly on her feet.  However over the past week, she has developed marked swelling in her lower extremities and abdomen.  She has severe nausea with vomiting following the third week of the first cycle of capecitabine, and this resolved spontaneously after about 48 hours.  No one else around her has been ill, and she does not have diarrhea.  Antinausea medication did not seem to help.  Her bowels have not been moving regularly; she is only had 3 bowel movements in the past 2 weeks.  She has been using Senokot with a stool softener, and is starting to see some results.  She states that she does not tolerate milk of magnesia or Miralax.  Her pain has been adequately controlled by fentanyl, and she is not using  tramadol.  She has not had any bleeding symptoms, rash, fevers, mouth sores, difficulty swallowing, bladder dysfunction, headaches, visual changes, seizures, dyspnea or cough.  She continues to have symptoms of acid reflux at night, despite having started on ranitidine 300 mg at bedtime           Breast cancer, left breast (CMS-HCC)    11/22/2013 Initial Diagnosis     Breast cancer, left breast; 4.8 cm left breast mass; suspicious nodes.         11/25/2013 Biopsy     IDC, Gr 2; ER +, PR +, HER2 0/not amplified         12/11/2013 -  Cancer Staged     BRCA 1/2 without mutation         12/31/2013 Surgery     left simple mastectomy and SLND: 4.5 cm primary; DCIS 1 of 5 positive node with 1.3 mm deposit.         01/10/2014 Surgery     ALND: 0 of 8 nodes imvolved.  02/13/2014 - 04/17/2014 Chemotherapy     Taxotere/Cyclophosphamide x 4 cycles         05/07/2014 - 06/27/2014 Radiation     33 fractions.         07/18/2014 - 01/24/2015 Chemotherapy     Tamoxifen. Stopped secondary to side effects.         02/01/2015 -  Chemotherapy     Leuprolide and letrozole         03/02/2015 -  Chemotherapy     Anastrozole in place of letrozole, secondary to nausea; not tolerated. Prescription for exemestane written, but not filled.         11/09/2015 -  Chemotherapy     Exemestane started by the patient         11/09/2015 -  Cancer Staged     Chest CT:  Irregular, mottled, and mildly expansile appearance of left seventh posterior rib may represent osseous metastasis. No lytic or blastic lesions are noted in thoracic spine. Mild degenerative changes of thoracic spine.          11/24/2015 -  Cancer Staged     PET/CT: Nodule in the left liver lobe, corresponds to ring-enhancing lesion present on 11/09/15 chest CT; left seventh rib equivocal for metastatic disease versus fracture. No other significant areas of uptake.         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 -- Two tiny T2 hyperintense lesions in the segment VII and segment VIA ;probable cysts.         02/05/2016 Surgery     Invasive adenocarcinoma, consistent with a metastasis from a breast (ductal) primary (2.5 cm) - Parenchymal resection margin is widely free (2.5 cm to carcinoma) ?????? - Estrogen receptor: Positive (95%, 3+; PR 3+; HER2 0         03/28/2016 -  Chemotherapy     Fulvestrant 500 mg         07/06/2016 -  Cancer Staged     CT NGE:XBMWUX sclerotic lesion concerning for metastases in a patient with history of breast cancer. Correlation with nuclear medicine bone scintigraphy is recommended.  -Nodular enhancement in the midline incision as well as in the underlying mesentery is indeterminant and may represent postsurgical change, less likely metastatic disease. Attention on follow-up.         07/15/2016 -  Cancer Staged     MRI L-spine without contrast: Mild degenerative changes in lumbar spine without significant spinal canal or neural foraminal narrowing. No evidence of malignancy.         07/19/2016 -  Cancer Staged     NM Bone Scan:  Persistent uptake in the left posterior seventh rib, likely healing fracture.  - Sclerotic sacral lesion without increased radiotracer uptake.         01/26/2017 -  Cancer Staged     PET-CT:  - Increased uptake at the hepatic resection site, most likely postsurgical change. Recommend clinical correlation. A PET/CT in 3-6 months may be helpful to more fully characterize  - Probable healing fracture in the left seventh rib and treated metastasis in the sacrum. Recommend attention  on future scans.          07/06/2017 -  Cancer Staged       MRI L-Spine: Sclerotic lesion at the anterior aspect of S2 with thin rim of peripheral enhancement, suggestive of sclerotic metastasis, unchanged in size from prior study. Additional ill-defined focus of enhancement in the right sacrum adjacent to the S2 neural foramen may also represent a small site of metastasis.  - L5-S1 central disc protrusion with mild central canal narrowing and neural foraminal narrowing.  - Findings suggestive of mild osteitis pubis.  - Left gluteus medius minimus tendinosis/partial tear. Mild right gluteus minimus tendinosis.         07/14/2017 -  Cancer Staged       PET-CT:    Overall progression of disease with new avid FDG uptake in the T1 vertebral body and T5 right pedicle concerning for metastatic disease. Along with interval increase in the FDG uptake in the more focal hepatic lesion when compared to prior imaging.    - Stable lesions in the left seventh rib and sacrum.                 07/27/2017 -  Chemotherapy     Palbociclib  125 mg for 21 of 28 days.         11/16/2017 - Cancer Staged       PET/CT:  Overall progression of disease with new osseous metastases in the T8, T11 vertebral body and the right sacrum along with progression of previously identified osseous metastases, including T7.  Note that these areas of uptake are not seen in PACS (visible only on radiology department software), did not have CT scan correlate.    - Interval resolution of the previously noted hepatic lesion adjacent the suture line however there is a new ill-defined hypermetabolic hepatic focus without evidence CT correlation. Recommend attention on follow-up imaging.         12/18/2017 -  Cancer Staged       Bone scan:  Decrease in uptake in the left posterior seventh rib is prior.  -Focal increased uptake at the T10 and L2 vertebral body as above.           02/27/2018 -  Cancer Staged       PET CT:   Overall progression of disease with innumerable new hypermetabolic osseous foci throughout the appendicular and axial skeleton as described above. There is also multiple new hypermetabolic foci remainder of the left hepatic lobe and right hepatic lobe without definite CT correlation as described above.    - Hypermetabolic pericaval lymph node in the abdomen is of unknown significance but may represent an additional site of disease.                    The following portions of the patient's history were reviewed and updated as appropriate: current medications and problem list.    Review of Systems   All other systems reviewed and are negative.      Vital signs for this encounter:  BSA: 2.2 meters squared  BP 107/59  - Pulse 116  - Temp 36 ??C (96.8 ??F) (Oral)  - Resp 20  - Wt (!) 111.1 kg (245 lb)  - SpO2 99%  - BMI 44.81 kg/m??      Repeat BP with the manual cuff:  142/98    Physical Exam  Constitutional: She is oriented to person, place, and time. She appears well-developed and well-nourished. No distress.  Skin on her face has a somewhat bronze aspect.  HENT:   Head: Normocephalic.   Mouth/Throat: Oropharynx is clear and moist.   Eyes: EOM are normal. Pupils are equal, round, and reactive to light. Mild jaundice (improved since last visit)  Neck: No JVD present. No thyromegaly present.   Cardiovascular: Normal rate and normal heart sounds. ??There is 2+ pitting edema in the back and abdominal walls, up to the level of the diaphragm.  Pulmonary/Chest: Effort normal and breath sounds normal.   Breast: 03/16/18:??Left chest wall is without nodules or tenderness; there are some areas of scarring, unchanged from previously. The most prominent site measures about 0.5-1 cm, and is lateral to the midclavicular line, just above the scar. The right breast is without suspicious nodules or discharge.   Lymph: No adenopathy in the neck, supraclavicular, axillary or inguinal regions.??  Abdominal: Soft.  Abdomen was obese, and without palpable organomegaly or tenderness today on exam.  Bowel sounds were normal.  Patient appeared to have ascites.  Musculoskeletal: There is 2???3+ pitting edema in the bilateral lower extremities. No tenderness to percussion over spine, chest wall.     Neurological: She is alert and oriented to person, place, and time. No cranial nerve deficit. She exhibits normal muscle tone. Coordination normal.   Skin: Skin on the soles of her feet, toes, sides of the feet are mildly erythematous, mildly tender.. Skin is not dry or cracked.  The skin on her hands appears to be normal.  Psychiatric: She has a normal mood and affect. Her behavior is normal.         Karnofsky/Lansky Performance Status  ECOG level 2     Results:    WBC   Date Value Ref Range Status   04/05/2018 4.4 (L) 4.5 - 11.0 10*9/L Final   10/24/2014 5.8 4.5 - 11.0 10*9/L Final     HGB   Date Value Ref Range Status   04/05/2018 9.0 (L) 13.5 - 16.0 g/dL Final   16/09/9603 54.0 12.0 - 16.0 g/dL Final     HCT   Date Value Ref Range Status   04/05/2018 26.8 (L) 36.0 - 46.0 % Final   10/24/2014 37.7 36.0 - 46.0 % Final     Platelet   Date Value Ref Range Status   04/05/2018 131 (L) 150 - 440 10*9/L Final   10/24/2014 199 150 - 440 10*9/L Final     LDH   Date Value Ref Range Status   04/05/2018 1,512 (H) 338 - 610 U/L Final     Creatinine Whole Blood, POC   Date Value Ref Range Status   07/06/2016 0.4 (L) 0.7 - 1.1 mg/dL Final     Creatinine   Date Value Ref Range Status   04/05/2018 0.43 (L) 0.60 - 1.00 mg/dL Final     AST   Date Value Ref Range Status   04/05/2018 99 (H) 14 - 38 U/L Final   01/22/2015 29 14 - 38 U/L Final     CEA   Date Value Ref Range Status   12/29/2015 1.5 0.0 - 5.0 ng/mL Final   02/06/2014 <0.5 0.0 - 5.0 ng/mL Final     AFP-Tumor Marker   Date Value Ref Range Status   12/29/2015 2.01 <7.51 ng/mL Final

## 2018-04-06 LAB — HEMOGLOBIN A1C: Hemoglobin A1c/Hemoglobin.total:MFr:Pt:Bld:Qn:: 5.8 — ABNORMAL HIGH

## 2018-04-06 NOTE — Unmapped (Signed)
Addended by: Richardo Hanks on: 04/05/2018 05:07 PM     Modules accepted: Orders

## 2018-04-09 MED ORDER — LANCETS
3 refills | 0 days | Status: CP
Start: 2018-04-09 — End: 2018-04-24

## 2018-04-09 MED ORDER — BLOOD SUGAR DIAGNOSTIC STRIPS
ORAL_STRIP | 3 refills | 0 days | Status: CP
Start: 2018-04-09 — End: 2018-04-24

## 2018-04-09 NOTE — Unmapped (Signed)
Patient calls the clinic today to address the following issues:    ?? Patient was able to procure the home glucose monitor from her local pharmacy as prescribed by Dr. Claude Manges.    ?? A new prescription has been sent to [facility] in [facility] for glucometer test strips and lancets.    ?? The patient continues to complain of acid reflux symptoms and has not yet taken the omeprazole as prescribed by Dr. Claude Manges.  ?? The patient is aware that a new prescription for omeprazole is available at the [facility] in [place].    ?? The patient states that she sleeps every night in a recliner and therefore has not increased the head elevation on her bed by the 4 to 6 inches as recommended by Dr. Claude Manges during his last clinic visit on 04/05/2018.    ?? The patient states that the added furosemide to her diuresis regimen has a noticeable impact as she is taking it every morning as prescribed.  ?? The patient has had no issues with hypotension/dizziness or lightheadedness since integrating the furosemide into her medication regimen.  ?? A current weight was not reported today during our phone call.     ?? The patient continues to have concerns regarding skin irritation/dryness to her feet as a side effect of the capecitabine.  ?? Patient has been encouraged to continue to liberally use the utter cream as recommended by Dr. Claude Manges.    Otherwise, the patient is doing well and had no other concerns or issues at this time.  The information listed above has been routed to Dr. Claude Manges for interpretation and review.    The next scheduled follow-up clinic visit with Dr. Claude Manges is on 04/18/2018 and repeat CT imaging will be completed at that time.    No other actions taken and Dr. Claude Manges is aware.

## 2018-04-16 NOTE — Unmapped (Signed)
The patient calls the clinic today to report the following:    ?? The patient has an acute area of swelling that presents under the arch of the right foot.    ?? The patient denies any acute trauma or injury and states that the skin irritation/dryness to her feet while taking capecitabine did not respond as expected when using the Utter cream as prescribed by Dr. Claude Manges.    ?? The attached images been reviewed by Dr. Claude Manges and the patient has been instructed to continue holding the capecitabine until the area of concern is resolved/healed.    ?? The patient stopped the capecitabine therapy on Wednesday, Apr 11, 2018 and will not restart therapy until directed to do so by Dr. Claude Manges.    ?? The next follow-up clinic visit with Dr. Claude Manges is scheduled for Wednesday, Apr 18, 2018.    Otherwise, the patient expressed verbal understanding and had no other questions or concerns at this time and is agreeable to our plan.  The patient is to call the clinic back sooner should her foot continue to have issues or not seem to be healing as expected.    No other actions taken and Dr. Claude Manges is aware.

## 2018-04-17 NOTE — Unmapped (Signed)
Followup Visit Note    Patient Name: Diane Gonzalez  Patient Age: 42 y.o.  Encounter Date: 04/18/2018    Referring Physician:   Towanda Malkin, MD  [address]  [id number]  [place], [place] [address]    Assessment/Plan:    Reason for Visit  Breast Cancer    Cancer Staging  Breast cancer, left breast (CMS-HCC)  Staging form: Breast, AJCC 7th Edition  - Clinical: ER 98%, PR 92%, HER2 0, not amplified; Gr 2 (Nott 6); BRCA 1/2 w.t. - Unsigned  - Pathologic: Stage IIB (T2, N37mi, cM0) - Signed by Talbert Cage, DO on 01/28/2014        1.  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        -- 07/14/17: PET CT: progression in T1/T5;          -- 08/02/17: Ibrance 125 mg daily        --02/27/2018: PET/CT: Progression in bone and liver and portal nodes; pancytopenia and elevated bilirubin/LFTs: Ibrance/Faslodex/Lupron stopped; start Xeloda 1000 mg/m?? twice daily for 14 days        --04/18/2018: CT CAP: Progression in liver; new peritoneal tumor deposits; bone lesions probably stable; ascites and marked dermal edema, left pleural effusion. --Xeloda held secondary to significant skin toxicity.     2.  Status post hysterectomy, ovaries/ tubes intact       3.  History of viral encephalitis with coma, with mild residual neurologic symptoms     4.  Lymphedema     5.  Syncope, probable orthostatic; w/u underway with Dr Julio Alm.        -- MRI of CNS without obvious lesion, final radiology report pending  01/19/16        --  EEG with out evidence of seizures.        -- Cardiology evaluation suggests not cardiac in etiology; referred to neurology        -- OSA 06/15/16; recommended positional therapy     6.  Depression; followed by [name];  on Pristiq and Zyprexa; off of gabapentin     7.  Headaches; resolved off of Effexor     8.  H/O meningitis, with brain biopsy       9.  Osteopenia  DEXA   01/19/16     10. Je[employer] Witness      11. OSA, w/ RLS; recommended positional sleeping, or  CPAP      -- 01/27/18: CPAP at 56mmH2) recommended    12. S/p Appendectomy, 05/16/17 [facility])    13. Lumbosacral mediated pain, lumbar DDD, possible sciatica          -- 07/17/17: Caudal epidural steroid injection with good pain relief.    14.  Acral erythema secondary to capecitabine,            Plan:   --Patient has progressive disease in the liver, by CT report, although with mildly improved liver function tests.  --The patient's fluid retention has not improved with furosemide 40 mg once daily and spironolactone 25 mg twice daily.  Note BNP - is low.  --The patient has marked skin changes secondary to keep Cytovene with generalized pigmentation as well as dry skin on her hands, with blistering and areas of ulceration.  --The patient cannot walk secondary to skin toxicity on her feet and marked edema lower extremities and abdomen.  She will be admitted to the hospital for management.  I spoken with the hospitalist (Dr. house) and of asked for more aggressive diuresis (for example spironolactone 100 mg twice daily, furosemide 40 to 80 mg IV daily), wound care for the ulcers (suggested Silvadene for areas of ulceration) as patient at risk for cellulitis, thoracentesis for diagnosis, and physical therapy.  --Have discussed options including hospice, and salvage palliative chemotherapy.  The patient was very certain that she want to try additional therapy, and had her parents support in this decision.  Have suggested a trial of single agent gemcitabine or eribulin.  Eribulin would require dose adjustment due to liver toxicity, so we will try single agent gemcitabine when she has sufficiently recovered from [place] sided being associated toxicity.          I have reviewed the laboratory, pathology, and radiology reports in detail and discussed findings with the patient.  The patient had a restaging MRI of the abdomen today. Preliminarily, no new lesions noted in the liver.    Interval History:  The patient returns for follow up.  The patient completed cycle 2 of Xeloda week ago.  Starting at about the last day of therapy she began to have marked dryness, redness, pain and eventually blistering with skin breakdown the skin of her feet and lesser degree the skin of her hands.  She is took furosemide and spironolactone as prescribed, but did not have any significant diuresis.  She has not had severe nausea and vomiting, and denies significant shortness of breath, but she has been largely chair and bedridden.  She cannot mobilize out of the house, even with her parents help, and had to call the local rescue squad to get her from her house to their car.  She has not had bleeding, fevers, mouth sores, diarrhea, headaches, visual changes, seizures.           Breast cancer, left breast (CMS-HCC)    11/22/2013 Initial Diagnosis     Breast cancer, left breast; 4.8 cm left breast mass; suspicious nodes.         11/25/2013 Biopsy     IDC, Gr 2; ER +, PR +, HER2 0/not amplified         12/11/2013 -  Cancer Staged     BRCA 1/2 without mutation         12/31/2013 Surgery     left simple mastectomy and SLND: 4.5 cm primary; DCIS 1 of 5 positive node with 1.3 mm deposit.  01/10/2014 Surgery     ALND: 0 of 8 nodes imvolved.         02/13/2014 - 04/17/2014 Chemotherapy     Taxotere/Cyclophosphamide x 4 cycles         05/07/2014 - 06/27/2014 Radiation     33 fractions.         07/18/2014 - 01/24/2015 Chemotherapy     Tamoxifen. Stopped secondary to side effects.         02/01/2015 -  Chemotherapy     Leuprolide and letrozole         03/02/2015 -  Chemotherapy     Anastrozole in place of letrozole, secondary to nausea; not tolerated. Prescription for exemestane written, but not filled.         11/09/2015 -  Chemotherapy     Exemestane started by the patient         11/09/2015 -  Cancer Staged     Chest CT:  Irregular, mottled, and mildly expansile appearance of left seventh posterior rib may represent osseous metastasis. No lytic or blastic lesions are noted in thoracic spine. Mild degenerative changes of thoracic spine.          11/24/2015 -  Cancer Staged     PET/CT: Nodule in the left liver lobe, corresponds to ring-enhancing lesion present on 11/09/15 chest CT; left seventh rib equivocal for metastatic disease versus fracture. No other significant areas of uptake.         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 -- Two tiny T2 hyperintense lesions in the segment VII and segment VIA ;probable cysts.         02/05/2016 Surgery     Invasive adenocarcinoma, consistent with a metastasis from a breast (ductal) primary (2.5 cm) - Parenchymal resection margin is widely free (2.5 cm to carcinoma) ?????? - Estrogen receptor: Positive (95%, 3+; PR 3+; HER2 0         03/28/2016 -  Chemotherapy     Fulvestrant 500 mg         07/06/2016 - Cancer Staged     CT ZOX:WRUEAV sclerotic lesion concerning for metastases in a patient with history of breast cancer. Correlation with nuclear medicine bone scintigraphy is recommended.  -Nodular enhancement in the midline incision as well as in the underlying mesentery is indeterminant and may represent postsurgical change, less likely metastatic disease. Attention on follow-up.         07/15/2016 -  Cancer Staged     MRI L-spine without contrast: Mild degenerative changes in lumbar spine without significant spinal canal or neural foraminal narrowing. No evidence of malignancy.         07/19/2016 -  Cancer Staged     NM Bone Scan:  Persistent uptake in the left posterior seventh rib, likely healing fracture.  - Sclerotic sacral lesion without increased radiotracer uptake.         01/26/2017 -  Cancer Staged     PET-CT:  - Increased uptake at the hepatic resection site, most likely postsurgical change. Recommend clinical correlation. A PET/CT in 3-6 months may be helpful to more fully  characterize  - Probable healing fracture in the left seventh rib and treated metastasis in the sacrum. Recommend attention on future scans.          07/06/2017 -  Cancer Staged       MRI L-Spine: Sclerotic lesion at the anterior aspect of S2 with thin rim of peripheral enhancement, suggestive of sclerotic metastasis, unchanged in size from prior study. Additional ill-defined focus of enhancement in the right sacrum adjacent to the S2 neural foramen may also represent a small site of metastasis.  - L5-S1 central disc protrusion with mild central canal narrowing and neural foraminal narrowing.  - Findings suggestive of mild osteitis pubis.  - Left gluteus medius minimus tendinosis/partial tear. Mild right gluteus minimus tendinosis.         07/14/2017 -  Cancer Staged       PET-CT:    Overall progression of disease with new avid FDG uptake in the T1 vertebral body and T5 right pedicle concerning for metastatic disease. Along with interval increase in the FDG uptake in the more focal hepatic lesion when compared to prior imaging.    - Stable lesions in the left seventh rib and sacrum.                 07/27/2017 -  Chemotherapy     Palbociclib  125 mg for 21 of 28 days.         11/16/2017 -  Cancer Staged       PET/CT:  Overall progression of disease with new osseous metastases in the T8, T11 vertebral body and the right sacrum along with progression of previously identified osseous metastases, including T7.  Note that these areas of uptake are not seen in PACS (visible only on radiology department software), did not have CT scan correlate.    - Interval resolution of the previously noted hepatic lesion adjacent the suture line however there is a new ill-defined hypermetabolic hepatic focus without evidence CT correlation. Recommend attention on follow-up imaging.         12/18/2017 -  Cancer Staged       Bone scan:  Decrease in uptake in the left posterior seventh rib is prior.  -Focal increased uptake at the T10 and L2 vertebral body as above.           02/27/2018 -  Cancer Staged       PET CT:   Overall progression of disease with innumerable new hypermetabolic osseous foci throughout the appendicular and axial skeleton as described above. There is also multiple new hypermetabolic foci remainder of the left hepatic lobe and right hepatic lobe without definite CT correlation as described above.    - Hypermetabolic pericaval lymph node in the abdomen is of unknown significance but may represent an additional site of disease.                 04/24/2018 - 04/24/2018 Chemotherapy     Chemotherapy Treatment    Treatment Goal Palliative   Line of Treatment [No plan line of treatment]   Plan Name OP BREAST ERIBULIN   Start Date [No treatment day found]   End Date [No treatment day found]   Provider Towanda Malkin, MD   Chemotherapy dexamethasone (DECADRON) tablet 8 mg, 8 mg, Oral, Once, 0 of 6 cycles  eriBULin (HALAVEN) syringe 2.53 mg, 1.12 mg/m2 = 2.53 mg (80 % of original dose 1.4 mg/m2), Intravenous, Once, 0 of 6 cycles  Dose modification: 1.12 mg/m2 (80 %  of original dose 1.4 mg/m2, Cycle 1, Reason: Dose Not Tolerated)            04/24/2018 -  Chemotherapy     Chemotherapy Treatment    Treatment Goal Palliative   Line of Treatment [No plan line of treatment]   Plan Name OP BREAST GEMCITABINE (2 OF 3 WEEKS)   Start Date 04/25/2018 (Planned)   End Date 12/19/2018 (Planned)   Provider Towanda Malkin, MD   Chemotherapy dexamethasone (DECADRON) tablet 8 mg, 8 mg, Oral, Once, 0 of 12 cycles  gemcitabine (GEMZAR) 1,808 mg in sodium chloride (NS) 0.9 % 250 mL IVPB, 800 mg/m2 = 1,808 mg (80 % of original dose 1,000 mg/m2), Intravenous, Once, 0 of 12 cycles  Dose modification: 800 mg/m2 (80 % of original dose 1,000 mg/m2, Cycle 1, Reason: Dose Not Tolerated)              Malignant neoplasm of left female breast (CMS-HCC)    02/02/2015 Initial Diagnosis     Malignant neoplasm of left female breast (CMS-HCC)         04/24/2018 -  Chemotherapy     Chemotherapy Treatment    Treatment Goal Palliative   Line of Treatment [No plan line of treatment]   Plan Name OP BREAST GEMCITABINE (2 OF 3 WEEKS)   Start Date 04/25/2018 (Planned)   End Date 12/19/2018 (Planned)   Provider Towanda Malkin, MD   Chemotherapy dexamethasone (DECADRON) tablet 8 mg, 8 mg, Oral, Once, 0 of 12 cycles  gemcitabine (GEMZAR) 1,808 mg in sodium chloride (NS) 0.9 % 250 mL IVPB, 800 mg/m2 = 1,808 mg (80 % of original dose 1,000 mg/m2), Intravenous, Once, 0 of 12 cycles  Dose modification: 800 mg/m2 (80 % of original dose 1,000 mg/m2, Cycle 1, Reason: Dose Not Tolerated)               The following portions of the patient's history were reviewed and updated as appropriate: current medications and problem list.    Review of Systems   All other systems reviewed and are negative.      Vital signs for this encounter:  BSA: 2.26 meters squared  BP 120/67  - Pulse 111  - Temp 36.4 ??C (97.5 ??F) (Oral)  - Wt (!) 116.6 kg (257 lb)  - SpO2 100%  - BMI 47.01 kg/m??      Repeat BP with the manual cuff:  142/98    Physical Exam  Constitutional: She is oriented to person, place, and time. She appears well-developed and well-nourished, with marked edema, pigmentation of the face and upper extremities, and moderate distress.    HENT:   Head: Normocephalic.   Mouth/Throat: Oropharynx is clear and moist.   Eyes: EOM are normal. Pupils are equal, round, and reactive to light. Mild jaundice (improved since last visit)  Neck: No JVD present. No thyromegaly present.   Cardiovascular: Normal rate and normal heart sounds. ??There is 2+ pitting edema in the back and abdominal walls, up to the level of the diaphragm.  Pulmonary/Chest: Effort normal and breath sounds normal.  There is dullness and decreased breath sounds in the left lung base.  Breast: 03/16/18:??Left chest wall is without nodules or tenderness; there are some areas of scarring, unchanged from previously. The most prominent site measures about 0.5-1 cm, and is lateral to the midclavicular line, just above the scar. The right breast is without suspicious nodules or discharge.   Lymph: No adenopathy in the neck, supraclavicular, axillary  or inguinal regions.??  Abdominal: Soft.  Abdomen was obese, and without palpable organomegaly or tenderness today on exam.  Bowel sounds were normal.  Patient appeared to have ascites.  Musculoskeletal: There is 2???3+ pitting edema in the bilateral lower extremities. No tenderness to percussion over spine, chest wall.     Neurological: She is alert and oriented to person, place, and time. No cranial nerve deficit. She exhibits normal muscle tone. Coordination normal.   Skin: Skin on the soles of her feet, toes, sides of the feet are very erythematous, tender, with dry cracked skin on hands and large list on the bottom of the right foot, and area in the anterior left foot where a blister has been unroofed, with several areas of ulceration at the base of the toes that weep fluid.  Psychiatric: She has a normal mood and affect. Her behavior is normal.         Karnofsky/Lansky Performance Status  ECOG level 2     Results:    WBC   Date Value Ref Range Status   04/18/2018 5.1 4.5 - 11.0 10*9/L Final   10/24/2014 5.8 4.5 - 11.0 10*9/L Final     HGB   Date Value Ref Range Status   04/18/2018 8.1 (L) 13.5 - 16.0 g/dL Final   16/09/9603 54.0 12.0 - 16.0 g/dL Final     HCT   Date Value Ref Range Status   04/18/2018 24.8 (L) 36.0 - 46.0 % Final   10/24/2014 37.7 36.0 - 46.0 % Final     Platelet   Date Value Ref Range Status   04/18/2018 99 (L) 150 - 440 10*9/L Final   10/24/2014 199 150 - 440 10*9/L Final     LDH   Date Value Ref Range Status   04/18/2018 1,147 (H) 338 - 610 U/L Final     Creatinine Whole Blood, POC   Date Value Ref Range Status   07/06/2016 0.4 (L) 0.7 - 1.1 mg/dL Final     Creatinine   Date Value Ref Range Status   04/18/2018 0.50 (L) 0.60 - 1.00 mg/dL Final   98/10/9146 8.29 (L) 0.60 - 1.00 mg/dL Final     AST   Date Value Ref Range Status   04/18/2018 127 (H) 14 - 38 U/L Final   01/22/2015 29 14 - 38 U/L Final     CEA   Date Value Ref Range Status   12/29/2015 1.5 0.0 - 5.0 ng/mL Final   02/06/2014 <0.5 0.0 - 5.0 ng/mL Final     AFP-Tumor Marker   Date Value Ref Range Status   12/29/2015 2.01 <7.51 ng/mL Final

## 2018-04-18 ENCOUNTER — Ambulatory Visit
Admit: 2018-04-18 | Discharge: 2018-04-24 | Disposition: A | Discharge status: Hospice | Payer: MEDICARE | Source: Ambulatory Visit

## 2018-04-18 ENCOUNTER — Ambulatory Visit
Admit: 2018-04-18 | Discharge: 2018-04-24 | Disposition: A | Discharge status: Hospice | Payer: MEDICARE | Source: Ambulatory Visit | Attending: Hematology & Oncology

## 2018-04-18 DIAGNOSIS — Z171 Estrogen receptor negative status [ER-]: Secondary | ICD-10-CM

## 2018-04-18 DIAGNOSIS — I890 Lymphedema, not elsewhere classified: Secondary | ICD-10-CM

## 2018-04-18 DIAGNOSIS — R609 Edema, unspecified: Secondary | ICD-10-CM

## 2018-04-18 DIAGNOSIS — Z170 Estrogen receptor positive status [ER+]: Secondary | ICD-10-CM

## 2018-04-18 DIAGNOSIS — C50112 Malignant neoplasm of central portion of left female breast: Principal | ICD-10-CM

## 2018-04-18 DIAGNOSIS — C50912 Malignant neoplasm of unspecified site of left female breast: Principal | ICD-10-CM

## 2018-04-18 LAB — CALCIUM
Calcium:MCnc:Pt:Ser/Plas:Qn:: 7.2 — ABNORMAL LOW
Calcium:MCnc:Pt:Ser/Plas:Qn:: 7.5 — ABNORMAL LOW

## 2018-04-18 LAB — ALBUMIN
Albumin:MCnc:Pt:Ser/Plas:Qn:: 2 — ABNORMAL LOW
Albumin:MCnc:Pt:Ser/Plas:Qn:: 2.2 — ABNORMAL LOW

## 2018-04-18 LAB — HEPATIC FUNCTION PANEL
ALBUMIN: 2.2 g/dL — ABNORMAL LOW (ref 3.5–5.0)
ALKALINE PHOSPHATASE: 659 U/L (ref 38–126)
AST (SGOT): 127 U/L — ABNORMAL HIGH (ref 14–38)
BILIRUBIN DIRECT: 5.1 mg/dL — ABNORMAL HIGH (ref 0.00–0.40)
PROTEIN TOTAL: 4.9 g/dL — ABNORMAL LOW (ref 6.5–8.3)

## 2018-04-18 LAB — CBC W/ AUTO DIFF
BASOPHILS ABSOLUTE COUNT: 0 10*9/L (ref 0.0–0.1)
BASOPHILS RELATIVE PERCENT: 0.6 %
EOSINOPHILS ABSOLUTE COUNT: 0.2 10*9/L (ref 0.0–0.4)
EOSINOPHILS RELATIVE PERCENT: 4.3 %
HEMATOCRIT: 24.8 % — ABNORMAL LOW (ref 36.0–46.0)
HEMOGLOBIN: 8.1 g/dL — ABNORMAL LOW (ref 13.5–16.0)
LARGE UNSTAINED CELLS: 4 % (ref 0–4)
LYMPHOCYTES RELATIVE PERCENT: 17.4 %
MEAN CORPUSCULAR HEMOGLOBIN CONC: 32.8 g/dL (ref 31.0–37.0)
MEAN CORPUSCULAR HEMOGLOBIN: 35.8 pg — ABNORMAL HIGH (ref 26.0–34.0)
MEAN CORPUSCULAR VOLUME: 109.2 fL — ABNORMAL HIGH (ref 80.0–100.0)
MONOCYTES ABSOLUTE COUNT: 0.4 10*9/L (ref 0.2–0.8)
MONOCYTES RELATIVE PERCENT: 8.2 %
NEUTROPHILS ABSOLUTE COUNT: 3.3 10*9/L (ref 2.0–7.5)
NEUTROPHILS RELATIVE PERCENT: 65.6 %
PLATELET COUNT: 99 10*9/L — ABNORMAL LOW (ref 150–440)
RED BLOOD CELL COUNT: 2.27 10*12/L — ABNORMAL LOW (ref 4.00–5.20)
RED CELL DISTRIBUTION WIDTH: 26.1 % — ABNORMAL HIGH (ref 12.0–15.0)
WBC ADJUSTED: 5.1 10*9/L (ref 4.5–11.0)

## 2018-04-18 LAB — LACTATE DEHYDROGENASE: Lactate dehydrogenase:CCnc:Pt:Ser/Plas:Qn:: 1147 — ABNORMAL HIGH

## 2018-04-18 LAB — ALKALINE PHOSPHATASE: Alkaline phosphatase:CCnc:Pt:Ser/Plas:Qn:: 659

## 2018-04-18 LAB — EGFR MDRD NON AF AMER: Glomerular filtration rate/1.73 sq M.predicted.non black:ArVRat:Pt:Ser/Plas/Bld:Qn:Creatinine-based formula (MDRD): >=60

## 2018-04-18 LAB — CREATININE
Creatinine:MCnc:Pt:Ser/Plas:Qn:: 0.53 — ABNORMAL LOW
EGFR MDRD AF AMER: >=60 mL/min/{1.73_m2} (ref >=60)
EGFR MDRD NON AF AMER: >=60 mL/min/{1.73_m2} (ref >=60)

## 2018-04-18 LAB — SMEAR REVIEW

## 2018-04-18 LAB — PHOSPHORUS
Phosphate:MCnc:Pt:Ser/Plas:Qn:: 3
Phosphate:MCnc:Pt:Ser/Plas:Qn:: 3

## 2018-04-18 LAB — POTASSIUM: Potassium:SCnc:Pt:Ser/Plas:Qn:: 3.3 — ABNORMAL LOW

## 2018-04-18 LAB — LYMPHOCYTES RELATIVE PERCENT: Lab: 17.4

## 2018-04-18 NOTE — Unmapped (Signed)
Patient stated capecitabine d/c at today's appointment and now transitioning to IV chemotherapy going forward.  Unenrolling from specialty pharmacy outreach calls at this time.

## 2018-04-18 NOTE — Unmapped (Signed)
POC discussed with pt and family. Shows no signs of distress, pt alert and oriented x4, VSS. Pt bed in lowest position, bed wheels locked, nonskid footwear applied OOB, call bell within reach, encouraged to use call bell for assistance, no falls during shift  Problem: Adult Inpatient Plan of Care  Goal: Plan of Care Review  Outcome: Progressing  Goal: Patient-Specific Goal (Individualization)  Outcome: Progressing  Goal: Absence of Hospital-Acquired Illness or Injury  Outcome: Progressing  Goal: Optimal Comfort and Wellbeing  Outcome: Progressing  Goal: Readiness for Transition of Care  Outcome: Progressing  Goal: Rounds/Family Conference  Outcome: Progressing     Problem: Self-Care Deficit  Goal: Improved Ability to Complete Activities of Daily Living  Outcome: Progressing     Problem: Latex Allergy  Goal: Absence of Allergy Symptoms  Outcome: Progressing     Problem: Fall Injury Risk  Goal: Absence of Fall and Fall-Related Injury  Outcome: Progressing

## 2018-04-18 NOTE — Unmapped (Signed)
Here to fu with dr Claude Manges. Hands and feet are painful with skin peeling. Continues on po chemo. States she weighs at home and has gained 5 lbs since last seen by brotherton.     Pt staying in clinic awaiting admission. Given snack and drink.     I spent 15 mins learning how to use her glucometer and injector and then teaching th ept how to use it. Pt verbalized understanding and was able to given verbal return demonstration.     Pt adm[facility] to [facility] to admiting and room.

## 2018-04-24 ENCOUNTER — Encounter: Admit: 2018-04-24 | Discharge: 2018-05-04 | Payer: MEDICARE

## 2018-04-24 DIAGNOSIS — C7989 Secondary malignant neoplasm of other specified sites: Secondary | ICD-10-CM

## 2018-04-24 DIAGNOSIS — C50912 Malignant neoplasm of unspecified site of left female breast: Principal | ICD-10-CM

## 2018-04-24 DIAGNOSIS — J90 Pleural effusion, not elsewhere classified: Secondary | ICD-10-CM

## 2018-04-24 DIAGNOSIS — E8809 Other disorders of plasma-protein metabolism, not elsewhere classified: Secondary | ICD-10-CM

## 2018-04-24 DIAGNOSIS — C787 Secondary malignant neoplasm of liver and intrahepatic bile duct: Secondary | ICD-10-CM

## 2018-04-24 DIAGNOSIS — Z87891 Personal history of nicotine dependence: Secondary | ICD-10-CM

## 2018-04-24 DIAGNOSIS — I890 Lymphedema, not elsewhere classified: Secondary | ICD-10-CM

## 2018-04-24 DIAGNOSIS — G893 Neoplasm related pain (acute) (chronic): Secondary | ICD-10-CM

## 2018-04-24 DIAGNOSIS — Z170 Estrogen receptor positive status [ER+]: Secondary | ICD-10-CM

## 2018-04-24 DIAGNOSIS — K7290 Hepatic failure, unspecified without coma: Secondary | ICD-10-CM

## 2018-04-24 DIAGNOSIS — Z66 Do not resuscitate: Secondary | ICD-10-CM

## 2018-04-24 DIAGNOSIS — Z466 Encounter for fitting and adjustment of urinary device: Secondary | ICD-10-CM

## 2018-04-24 DIAGNOSIS — K766 Portal hypertension: Secondary | ICD-10-CM

## 2018-04-24 DIAGNOSIS — Z9221 Personal history of antineoplastic chemotherapy: Secondary | ICD-10-CM

## 2018-04-24 DIAGNOSIS — Z9012 Acquired absence of left breast and nipple: Secondary | ICD-10-CM

## 2018-04-24 DIAGNOSIS — C7951 Secondary malignant neoplasm of bone: Secondary | ICD-10-CM

## 2018-04-24 DIAGNOSIS — R601 Generalized edema: Secondary | ICD-10-CM

## 2018-04-24 DIAGNOSIS — R188 Other ascites: Secondary | ICD-10-CM

## 2018-04-24 DIAGNOSIS — F3341 Major depressive disorder, recurrent, in partial remission: Secondary | ICD-10-CM

## 2018-04-24 MED ORDER — MORPHINE CONCENTRATE 100 MG/5 ML (20 MG/ML) ORAL SOLUTION
ORAL | 0 refills | 0 days | Status: CP | PRN
Start: 2018-04-24 — End: 2018-04-29

## 2018-04-24 MED ORDER — LORAZEPAM 0.5 MG TABLET: tablet | 2 refills | 0 days

## 2018-04-24 MED ORDER — LORAZEPAM 0.5 MG TABLET
ORAL_TABLET | ORAL | 2 refills | 0.00000 days | Status: CP | PRN
Start: 2018-04-24 — End: 2018-04-24

## 2018-04-24 MED FILL — MORPHINE SULFATE/100/5ML/SOLN: MORPHINE SULFATE/100/5ML/SOLN | 15 days supply | Qty: 30 | Fill #0

## 2018-04-24 MED FILL — LORAZEPAM/0.5MG/TAB: LORAZEPAM/0.5MG/TAB | 3 days supply | Qty: 15 | Fill #0

## 2018-04-26 DIAGNOSIS — F3341 Major depressive disorder, recurrent, in partial remission: Secondary | ICD-10-CM

## 2018-04-26 DIAGNOSIS — I890 Lymphedema, not elsewhere classified: Secondary | ICD-10-CM

## 2018-04-26 DIAGNOSIS — K7290 Hepatic failure, unspecified without coma: Secondary | ICD-10-CM

## 2018-04-26 DIAGNOSIS — Z9221 Personal history of antineoplastic chemotherapy: Secondary | ICD-10-CM

## 2018-04-26 DIAGNOSIS — J90 Pleural effusion, not elsewhere classified: Secondary | ICD-10-CM

## 2018-04-26 DIAGNOSIS — G893 Neoplasm related pain (acute) (chronic): Secondary | ICD-10-CM

## 2018-04-26 DIAGNOSIS — Z87891 Personal history of nicotine dependence: Secondary | ICD-10-CM

## 2018-04-26 DIAGNOSIS — Z170 Estrogen receptor positive status [ER+]: Secondary | ICD-10-CM

## 2018-04-26 DIAGNOSIS — R601 Generalized edema: Secondary | ICD-10-CM

## 2018-04-26 DIAGNOSIS — C7989 Secondary malignant neoplasm of other specified sites: Secondary | ICD-10-CM

## 2018-04-26 DIAGNOSIS — Z9012 Acquired absence of left breast and nipple: Secondary | ICD-10-CM

## 2018-04-26 DIAGNOSIS — C50912 Malignant neoplasm of unspecified site of left female breast: Principal | ICD-10-CM

## 2018-04-26 DIAGNOSIS — Z466 Encounter for fitting and adjustment of urinary device: Secondary | ICD-10-CM

## 2018-04-26 DIAGNOSIS — R188 Other ascites: Secondary | ICD-10-CM

## 2018-04-26 DIAGNOSIS — E8809 Other disorders of plasma-protein metabolism, not elsewhere classified: Secondary | ICD-10-CM

## 2018-04-26 DIAGNOSIS — C7951 Secondary malignant neoplasm of bone: Secondary | ICD-10-CM

## 2018-04-26 DIAGNOSIS — C787 Secondary malignant neoplasm of liver and intrahepatic bile duct: Secondary | ICD-10-CM

## 2018-04-26 DIAGNOSIS — Z66 Do not resuscitate: Secondary | ICD-10-CM

## 2018-04-26 DIAGNOSIS — K766 Portal hypertension: Secondary | ICD-10-CM

## 2018-04-27 DIAGNOSIS — C7989 Secondary malignant neoplasm of other specified sites: Secondary | ICD-10-CM

## 2018-04-27 DIAGNOSIS — R188 Other ascites: Secondary | ICD-10-CM

## 2018-04-27 DIAGNOSIS — K766 Portal hypertension: Secondary | ICD-10-CM

## 2018-04-27 DIAGNOSIS — I890 Lymphedema, not elsewhere classified: Secondary | ICD-10-CM

## 2018-04-27 DIAGNOSIS — C50912 Malignant neoplasm of unspecified site of left female breast: Principal | ICD-10-CM

## 2018-04-27 DIAGNOSIS — F3341 Major depressive disorder, recurrent, in partial remission: Secondary | ICD-10-CM

## 2018-04-27 DIAGNOSIS — Z170 Estrogen receptor positive status [ER+]: Secondary | ICD-10-CM

## 2018-04-27 DIAGNOSIS — C7951 Secondary malignant neoplasm of bone: Secondary | ICD-10-CM

## 2018-04-27 DIAGNOSIS — Z9012 Acquired absence of left breast and nipple: Secondary | ICD-10-CM

## 2018-04-27 DIAGNOSIS — J90 Pleural effusion, not elsewhere classified: Secondary | ICD-10-CM

## 2018-04-27 DIAGNOSIS — Z9221 Personal history of antineoplastic chemotherapy: Secondary | ICD-10-CM

## 2018-04-27 DIAGNOSIS — G893 Neoplasm related pain (acute) (chronic): Secondary | ICD-10-CM

## 2018-04-27 DIAGNOSIS — Z87891 Personal history of nicotine dependence: Secondary | ICD-10-CM

## 2018-04-27 DIAGNOSIS — K7290 Hepatic failure, unspecified without coma: Secondary | ICD-10-CM

## 2018-04-27 DIAGNOSIS — C787 Secondary malignant neoplasm of liver and intrahepatic bile duct: Secondary | ICD-10-CM

## 2018-04-27 DIAGNOSIS — E8809 Other disorders of plasma-protein metabolism, not elsewhere classified: Secondary | ICD-10-CM

## 2018-04-27 DIAGNOSIS — Z466 Encounter for fitting and adjustment of urinary device: Secondary | ICD-10-CM

## 2018-04-27 DIAGNOSIS — R601 Generalized edema: Secondary | ICD-10-CM

## 2018-04-27 DIAGNOSIS — Z66 Do not resuscitate: Secondary | ICD-10-CM

## 2018-04-28 DIAGNOSIS — Z9221 Personal history of antineoplastic chemotherapy: Secondary | ICD-10-CM

## 2018-04-28 DIAGNOSIS — R188 Other ascites: Secondary | ICD-10-CM

## 2018-04-28 DIAGNOSIS — R601 Generalized edema: Secondary | ICD-10-CM

## 2018-04-28 DIAGNOSIS — I890 Lymphedema, not elsewhere classified: Secondary | ICD-10-CM

## 2018-04-28 DIAGNOSIS — F3341 Major depressive disorder, recurrent, in partial remission: Secondary | ICD-10-CM

## 2018-04-28 DIAGNOSIS — K766 Portal hypertension: Secondary | ICD-10-CM

## 2018-04-28 DIAGNOSIS — Z466 Encounter for fitting and adjustment of urinary device: Secondary | ICD-10-CM

## 2018-04-28 DIAGNOSIS — G893 Neoplasm related pain (acute) (chronic): Secondary | ICD-10-CM

## 2018-04-28 DIAGNOSIS — J90 Pleural effusion, not elsewhere classified: Secondary | ICD-10-CM

## 2018-04-28 DIAGNOSIS — C50912 Malignant neoplasm of unspecified site of left female breast: Principal | ICD-10-CM

## 2018-04-28 DIAGNOSIS — C787 Secondary malignant neoplasm of liver and intrahepatic bile duct: Secondary | ICD-10-CM

## 2018-04-28 DIAGNOSIS — Z9012 Acquired absence of left breast and nipple: Secondary | ICD-10-CM

## 2018-04-28 DIAGNOSIS — Z87891 Personal history of nicotine dependence: Secondary | ICD-10-CM

## 2018-04-28 DIAGNOSIS — Z170 Estrogen receptor positive status [ER+]: Secondary | ICD-10-CM

## 2018-04-28 DIAGNOSIS — C7989 Secondary malignant neoplasm of other specified sites: Secondary | ICD-10-CM

## 2018-04-28 DIAGNOSIS — K7290 Hepatic failure, unspecified without coma: Secondary | ICD-10-CM

## 2018-04-28 DIAGNOSIS — Z66 Do not resuscitate: Secondary | ICD-10-CM

## 2018-04-28 DIAGNOSIS — C7951 Secondary malignant neoplasm of bone: Secondary | ICD-10-CM

## 2018-04-28 DIAGNOSIS — E8809 Other disorders of plasma-protein metabolism, not elsewhere classified: Secondary | ICD-10-CM

## 2018-04-30 DIAGNOSIS — Z66 Do not resuscitate: Secondary | ICD-10-CM

## 2018-04-30 DIAGNOSIS — I890 Lymphedema, not elsewhere classified: Secondary | ICD-10-CM

## 2018-04-30 DIAGNOSIS — Z170 Estrogen receptor positive status [ER+]: Secondary | ICD-10-CM

## 2018-04-30 DIAGNOSIS — K766 Portal hypertension: Secondary | ICD-10-CM

## 2018-04-30 DIAGNOSIS — Z9012 Acquired absence of left breast and nipple: Secondary | ICD-10-CM

## 2018-04-30 DIAGNOSIS — C7989 Secondary malignant neoplasm of other specified sites: Secondary | ICD-10-CM

## 2018-04-30 DIAGNOSIS — Z9221 Personal history of antineoplastic chemotherapy: Secondary | ICD-10-CM

## 2018-04-30 DIAGNOSIS — E8809 Other disorders of plasma-protein metabolism, not elsewhere classified: Secondary | ICD-10-CM

## 2018-04-30 DIAGNOSIS — R601 Generalized edema: Secondary | ICD-10-CM

## 2018-04-30 DIAGNOSIS — C7951 Secondary malignant neoplasm of bone: Secondary | ICD-10-CM

## 2018-04-30 DIAGNOSIS — C787 Secondary malignant neoplasm of liver and intrahepatic bile duct: Secondary | ICD-10-CM

## 2018-04-30 DIAGNOSIS — Z466 Encounter for fitting and adjustment of urinary device: Secondary | ICD-10-CM

## 2018-04-30 DIAGNOSIS — C50912 Malignant neoplasm of unspecified site of left female breast: Principal | ICD-10-CM

## 2018-04-30 DIAGNOSIS — R188 Other ascites: Secondary | ICD-10-CM

## 2018-04-30 DIAGNOSIS — G893 Neoplasm related pain (acute) (chronic): Secondary | ICD-10-CM

## 2018-04-30 DIAGNOSIS — F3341 Major depressive disorder, recurrent, in partial remission: Secondary | ICD-10-CM

## 2018-04-30 DIAGNOSIS — Z87891 Personal history of nicotine dependence: Secondary | ICD-10-CM

## 2018-04-30 DIAGNOSIS — K7290 Hepatic failure, unspecified without coma: Secondary | ICD-10-CM

## 2018-04-30 DIAGNOSIS — J90 Pleural effusion, not elsewhere classified: Secondary | ICD-10-CM

## 2018-05-01 DIAGNOSIS — I890 Lymphedema, not elsewhere classified: Secondary | ICD-10-CM

## 2018-05-01 DIAGNOSIS — K7290 Hepatic failure, unspecified without coma: Secondary | ICD-10-CM

## 2018-05-01 DIAGNOSIS — R188 Other ascites: Secondary | ICD-10-CM

## 2018-05-01 DIAGNOSIS — C7989 Secondary malignant neoplasm of other specified sites: Secondary | ICD-10-CM

## 2018-05-01 DIAGNOSIS — Z9012 Acquired absence of left breast and nipple: Secondary | ICD-10-CM

## 2018-05-01 DIAGNOSIS — Z9221 Personal history of antineoplastic chemotherapy: Secondary | ICD-10-CM

## 2018-05-01 DIAGNOSIS — E8809 Other disorders of plasma-protein metabolism, not elsewhere classified: Secondary | ICD-10-CM

## 2018-05-01 DIAGNOSIS — Z466 Encounter for fitting and adjustment of urinary device: Secondary | ICD-10-CM

## 2018-05-01 DIAGNOSIS — C787 Secondary malignant neoplasm of liver and intrahepatic bile duct: Secondary | ICD-10-CM

## 2018-05-01 DIAGNOSIS — F3341 Major depressive disorder, recurrent, in partial remission: Secondary | ICD-10-CM

## 2018-05-01 DIAGNOSIS — C50912 Malignant neoplasm of unspecified site of left female breast: Principal | ICD-10-CM

## 2018-05-01 DIAGNOSIS — K766 Portal hypertension: Secondary | ICD-10-CM

## 2018-05-01 DIAGNOSIS — J90 Pleural effusion, not elsewhere classified: Secondary | ICD-10-CM

## 2018-05-01 DIAGNOSIS — Z87891 Personal history of nicotine dependence: Secondary | ICD-10-CM

## 2018-05-01 DIAGNOSIS — Z66 Do not resuscitate: Secondary | ICD-10-CM

## 2018-05-01 DIAGNOSIS — G893 Neoplasm related pain (acute) (chronic): Secondary | ICD-10-CM

## 2018-05-01 DIAGNOSIS — Z170 Estrogen receptor positive status [ER+]: Secondary | ICD-10-CM

## 2018-05-01 DIAGNOSIS — R601 Generalized edema: Secondary | ICD-10-CM

## 2018-05-01 DIAGNOSIS — C7951 Secondary malignant neoplasm of bone: Secondary | ICD-10-CM

## 2018-05-02 DIAGNOSIS — K7290 Hepatic failure, unspecified without coma: Secondary | ICD-10-CM

## 2018-05-02 DIAGNOSIS — Z87891 Personal history of nicotine dependence: Secondary | ICD-10-CM

## 2018-05-02 DIAGNOSIS — Z466 Encounter for fitting and adjustment of urinary device: Secondary | ICD-10-CM

## 2018-05-02 DIAGNOSIS — F3341 Major depressive disorder, recurrent, in partial remission: Secondary | ICD-10-CM

## 2018-05-02 DIAGNOSIS — C7989 Secondary malignant neoplasm of other specified sites: Secondary | ICD-10-CM

## 2018-05-02 DIAGNOSIS — J90 Pleural effusion, not elsewhere classified: Secondary | ICD-10-CM

## 2018-05-02 DIAGNOSIS — G893 Neoplasm related pain (acute) (chronic): Secondary | ICD-10-CM

## 2018-05-02 DIAGNOSIS — I890 Lymphedema, not elsewhere classified: Secondary | ICD-10-CM

## 2018-05-02 DIAGNOSIS — E8809 Other disorders of plasma-protein metabolism, not elsewhere classified: Secondary | ICD-10-CM

## 2018-05-02 DIAGNOSIS — R188 Other ascites: Secondary | ICD-10-CM

## 2018-05-02 DIAGNOSIS — R601 Generalized edema: Secondary | ICD-10-CM

## 2018-05-02 DIAGNOSIS — Z170 Estrogen receptor positive status [ER+]: Secondary | ICD-10-CM

## 2018-05-02 DIAGNOSIS — C7951 Secondary malignant neoplasm of bone: Secondary | ICD-10-CM

## 2018-05-02 DIAGNOSIS — Z66 Do not resuscitate: Secondary | ICD-10-CM

## 2018-05-02 DIAGNOSIS — K766 Portal hypertension: Secondary | ICD-10-CM

## 2018-05-02 DIAGNOSIS — C50912 Malignant neoplasm of unspecified site of left female breast: Principal | ICD-10-CM

## 2018-05-02 DIAGNOSIS — Z9221 Personal history of antineoplastic chemotherapy: Secondary | ICD-10-CM

## 2018-05-02 DIAGNOSIS — Z9012 Acquired absence of left breast and nipple: Secondary | ICD-10-CM

## 2018-05-02 DIAGNOSIS — C787 Secondary malignant neoplasm of liver and intrahepatic bile duct: Secondary | ICD-10-CM

## 2018-05-03 DIAGNOSIS — C7989 Secondary malignant neoplasm of other specified sites: Secondary | ICD-10-CM

## 2018-05-03 DIAGNOSIS — Z87891 Personal history of nicotine dependence: Secondary | ICD-10-CM

## 2018-05-03 DIAGNOSIS — J90 Pleural effusion, not elsewhere classified: Secondary | ICD-10-CM

## 2018-05-03 DIAGNOSIS — R188 Other ascites: Secondary | ICD-10-CM

## 2018-05-03 DIAGNOSIS — K766 Portal hypertension: Secondary | ICD-10-CM

## 2018-05-03 DIAGNOSIS — Z170 Estrogen receptor positive status [ER+]: Secondary | ICD-10-CM

## 2018-05-03 DIAGNOSIS — Z9012 Acquired absence of left breast and nipple: Secondary | ICD-10-CM

## 2018-05-03 DIAGNOSIS — C787 Secondary malignant neoplasm of liver and intrahepatic bile duct: Secondary | ICD-10-CM

## 2018-05-03 DIAGNOSIS — Z466 Encounter for fitting and adjustment of urinary device: Secondary | ICD-10-CM

## 2018-05-03 DIAGNOSIS — Z9221 Personal history of antineoplastic chemotherapy: Secondary | ICD-10-CM

## 2018-05-03 DIAGNOSIS — Z66 Do not resuscitate: Secondary | ICD-10-CM

## 2018-05-03 DIAGNOSIS — R601 Generalized edema: Secondary | ICD-10-CM

## 2018-05-03 DIAGNOSIS — I890 Lymphedema, not elsewhere classified: Secondary | ICD-10-CM

## 2018-05-03 DIAGNOSIS — C7951 Secondary malignant neoplasm of bone: Secondary | ICD-10-CM

## 2018-05-03 DIAGNOSIS — E8809 Other disorders of plasma-protein metabolism, not elsewhere classified: Secondary | ICD-10-CM

## 2018-05-03 DIAGNOSIS — K7290 Hepatic failure, unspecified without coma: Secondary | ICD-10-CM

## 2018-05-03 DIAGNOSIS — F3341 Major depressive disorder, recurrent, in partial remission: Secondary | ICD-10-CM

## 2018-05-03 DIAGNOSIS — C50912 Malignant neoplasm of unspecified site of left female breast: Principal | ICD-10-CM

## 2018-05-03 DIAGNOSIS — G893 Neoplasm related pain (acute) (chronic): Secondary | ICD-10-CM

## 2018-05-04 DIAGNOSIS — Z87891 Personal history of nicotine dependence: Secondary | ICD-10-CM

## 2018-05-04 DIAGNOSIS — I890 Lymphedema, not elsewhere classified: Secondary | ICD-10-CM

## 2018-05-04 DIAGNOSIS — C7989 Secondary malignant neoplasm of other specified sites: Secondary | ICD-10-CM

## 2018-05-04 DIAGNOSIS — Z9012 Acquired absence of left breast and nipple: Secondary | ICD-10-CM

## 2018-05-04 DIAGNOSIS — C787 Secondary malignant neoplasm of liver and intrahepatic bile duct: Secondary | ICD-10-CM

## 2018-05-04 DIAGNOSIS — Z66 Do not resuscitate: Secondary | ICD-10-CM

## 2018-05-04 DIAGNOSIS — R601 Generalized edema: Secondary | ICD-10-CM

## 2018-05-04 DIAGNOSIS — Z9221 Personal history of antineoplastic chemotherapy: Secondary | ICD-10-CM

## 2018-05-04 DIAGNOSIS — Z466 Encounter for fitting and adjustment of urinary device: Secondary | ICD-10-CM

## 2018-05-04 DIAGNOSIS — G893 Neoplasm related pain (acute) (chronic): Secondary | ICD-10-CM

## 2018-05-04 DIAGNOSIS — R188 Other ascites: Secondary | ICD-10-CM

## 2018-05-04 DIAGNOSIS — C7951 Secondary malignant neoplasm of bone: Secondary | ICD-10-CM

## 2018-05-04 DIAGNOSIS — J90 Pleural effusion, not elsewhere classified: Secondary | ICD-10-CM

## 2018-05-04 DIAGNOSIS — K7290 Hepatic failure, unspecified without coma: Secondary | ICD-10-CM

## 2018-05-04 DIAGNOSIS — C50912 Malignant neoplasm of unspecified site of left female breast: Principal | ICD-10-CM

## 2018-05-04 DIAGNOSIS — K766 Portal hypertension: Secondary | ICD-10-CM

## 2018-05-04 DIAGNOSIS — F3341 Major depressive disorder, recurrent, in partial remission: Secondary | ICD-10-CM

## 2018-05-04 DIAGNOSIS — Z170 Estrogen receptor positive status [ER+]: Secondary | ICD-10-CM

## 2018-05-04 DIAGNOSIS — E8809 Other disorders of plasma-protein metabolism, not elsewhere classified: Secondary | ICD-10-CM

## 2018-05-05 ENCOUNTER — Encounter: Admit: 2018-05-05 | Discharge: 2018-06-03 | Payer: MEDICARE

## 2018-05-05 ENCOUNTER — Encounter: Admit: 2018-05-05 | Discharge: 2018-06-03 | Payer: BLUE CROSS/BLUE SHIELD

## 2018-05-05 DIAGNOSIS — C7989 Secondary malignant neoplasm of other specified sites: Secondary | ICD-10-CM

## 2018-05-05 DIAGNOSIS — Z466 Encounter for fitting and adjustment of urinary device: Secondary | ICD-10-CM

## 2018-05-05 DIAGNOSIS — Z9012 Acquired absence of left breast and nipple: Secondary | ICD-10-CM

## 2018-05-05 DIAGNOSIS — J90 Pleural effusion, not elsewhere classified: Secondary | ICD-10-CM

## 2018-05-05 DIAGNOSIS — G893 Neoplasm related pain (acute) (chronic): Secondary | ICD-10-CM

## 2018-05-05 DIAGNOSIS — K766 Portal hypertension: Secondary | ICD-10-CM

## 2018-05-05 DIAGNOSIS — R188 Other ascites: Secondary | ICD-10-CM

## 2018-05-05 DIAGNOSIS — C7951 Secondary malignant neoplasm of bone: Secondary | ICD-10-CM

## 2018-05-05 DIAGNOSIS — Z66 Do not resuscitate: Secondary | ICD-10-CM

## 2018-05-05 DIAGNOSIS — C787 Secondary malignant neoplasm of liver and intrahepatic bile duct: Secondary | ICD-10-CM

## 2018-05-05 DIAGNOSIS — Z87891 Personal history of nicotine dependence: Secondary | ICD-10-CM

## 2018-05-05 DIAGNOSIS — K7290 Hepatic failure, unspecified without coma: Secondary | ICD-10-CM

## 2018-05-05 DIAGNOSIS — Z9221 Personal history of antineoplastic chemotherapy: Secondary | ICD-10-CM

## 2018-05-05 DIAGNOSIS — R601 Generalized edema: Secondary | ICD-10-CM

## 2018-05-05 DIAGNOSIS — E8809 Other disorders of plasma-protein metabolism, not elsewhere classified: Secondary | ICD-10-CM

## 2018-05-05 DIAGNOSIS — C50912 Malignant neoplasm of unspecified site of left female breast: Principal | ICD-10-CM

## 2018-05-05 DIAGNOSIS — Z170 Estrogen receptor positive status [ER+]: Secondary | ICD-10-CM

## 2018-05-05 DIAGNOSIS — I890 Lymphedema, not elsewhere classified: Secondary | ICD-10-CM

## 2018-05-05 DIAGNOSIS — F3341 Major depressive disorder, recurrent, in partial remission: Secondary | ICD-10-CM

## 2018-05-07 DIAGNOSIS — E8809 Other disorders of plasma-protein metabolism, not elsewhere classified: Secondary | ICD-10-CM

## 2018-05-07 DIAGNOSIS — Z466 Encounter for fitting and adjustment of urinary device: Secondary | ICD-10-CM

## 2018-05-07 DIAGNOSIS — C7951 Secondary malignant neoplasm of bone: Secondary | ICD-10-CM

## 2018-05-07 DIAGNOSIS — R188 Other ascites: Secondary | ICD-10-CM

## 2018-05-07 DIAGNOSIS — K7290 Hepatic failure, unspecified without coma: Secondary | ICD-10-CM

## 2018-05-07 DIAGNOSIS — J90 Pleural effusion, not elsewhere classified: Secondary | ICD-10-CM

## 2018-05-07 DIAGNOSIS — I890 Lymphedema, not elsewhere classified: Secondary | ICD-10-CM

## 2018-05-07 DIAGNOSIS — C7989 Secondary malignant neoplasm of other specified sites: Secondary | ICD-10-CM

## 2018-05-07 DIAGNOSIS — Z170 Estrogen receptor positive status [ER+]: Secondary | ICD-10-CM

## 2018-05-07 DIAGNOSIS — C787 Secondary malignant neoplasm of liver and intrahepatic bile duct: Secondary | ICD-10-CM

## 2018-05-07 DIAGNOSIS — Z9012 Acquired absence of left breast and nipple: Secondary | ICD-10-CM

## 2018-05-07 DIAGNOSIS — K766 Portal hypertension: Secondary | ICD-10-CM

## 2018-05-07 DIAGNOSIS — Z87891 Personal history of nicotine dependence: Secondary | ICD-10-CM

## 2018-05-07 DIAGNOSIS — Z9221 Personal history of antineoplastic chemotherapy: Secondary | ICD-10-CM

## 2018-05-07 DIAGNOSIS — F3341 Major depressive disorder, recurrent, in partial remission: Secondary | ICD-10-CM

## 2018-05-07 DIAGNOSIS — G893 Neoplasm related pain (acute) (chronic): Secondary | ICD-10-CM

## 2018-05-07 DIAGNOSIS — C50912 Malignant neoplasm of unspecified site of left female breast: Principal | ICD-10-CM

## 2018-05-07 DIAGNOSIS — R601 Generalized edema: Secondary | ICD-10-CM

## 2018-05-07 DIAGNOSIS — Z66 Do not resuscitate: Secondary | ICD-10-CM

## 2018-05-07 MED ORDER — FENTANYL 50 MCG/HR TRANSDERMAL PATCH
MEDICATED_PATCH | TRANSDERMAL | 0 refills | 0 days | Status: CP
Start: 2018-05-07 — End: 2018-06-05

## 2018-05-09 DIAGNOSIS — C787 Secondary malignant neoplasm of liver and intrahepatic bile duct: Secondary | ICD-10-CM

## 2018-05-09 DIAGNOSIS — Z466 Encounter for fitting and adjustment of urinary device: Secondary | ICD-10-CM

## 2018-05-09 DIAGNOSIS — C7989 Secondary malignant neoplasm of other specified sites: Secondary | ICD-10-CM

## 2018-05-09 DIAGNOSIS — Z170 Estrogen receptor positive status [ER+]: Secondary | ICD-10-CM

## 2018-05-09 DIAGNOSIS — R188 Other ascites: Secondary | ICD-10-CM

## 2018-05-09 DIAGNOSIS — C50912 Malignant neoplasm of unspecified site of left female breast: Principal | ICD-10-CM

## 2018-05-09 DIAGNOSIS — G893 Neoplasm related pain (acute) (chronic): Secondary | ICD-10-CM

## 2018-05-09 DIAGNOSIS — E8809 Other disorders of plasma-protein metabolism, not elsewhere classified: Secondary | ICD-10-CM

## 2018-05-09 DIAGNOSIS — I890 Lymphedema, not elsewhere classified: Secondary | ICD-10-CM

## 2018-05-09 DIAGNOSIS — J90 Pleural effusion, not elsewhere classified: Secondary | ICD-10-CM

## 2018-05-09 DIAGNOSIS — Z87891 Personal history of nicotine dependence: Secondary | ICD-10-CM

## 2018-05-09 DIAGNOSIS — K766 Portal hypertension: Secondary | ICD-10-CM

## 2018-05-09 DIAGNOSIS — Z9012 Acquired absence of left breast and nipple: Secondary | ICD-10-CM

## 2018-05-09 DIAGNOSIS — Z66 Do not resuscitate: Secondary | ICD-10-CM

## 2018-05-09 DIAGNOSIS — Z9221 Personal history of antineoplastic chemotherapy: Secondary | ICD-10-CM

## 2018-05-09 DIAGNOSIS — R601 Generalized edema: Secondary | ICD-10-CM

## 2018-05-09 DIAGNOSIS — C7951 Secondary malignant neoplasm of bone: Secondary | ICD-10-CM

## 2018-05-09 DIAGNOSIS — F3341 Major depressive disorder, recurrent, in partial remission: Secondary | ICD-10-CM

## 2018-05-09 DIAGNOSIS — K7290 Hepatic failure, unspecified without coma: Secondary | ICD-10-CM

## 2018-05-11 DIAGNOSIS — Z66 Do not resuscitate: Secondary | ICD-10-CM

## 2018-05-11 DIAGNOSIS — E8809 Other disorders of plasma-protein metabolism, not elsewhere classified: Secondary | ICD-10-CM

## 2018-05-11 DIAGNOSIS — R601 Generalized edema: Secondary | ICD-10-CM

## 2018-05-11 DIAGNOSIS — K766 Portal hypertension: Secondary | ICD-10-CM

## 2018-05-11 DIAGNOSIS — F3341 Major depressive disorder, recurrent, in partial remission: Secondary | ICD-10-CM

## 2018-05-11 DIAGNOSIS — C787 Secondary malignant neoplasm of liver and intrahepatic bile duct: Secondary | ICD-10-CM

## 2018-05-11 DIAGNOSIS — Z87891 Personal history of nicotine dependence: Secondary | ICD-10-CM

## 2018-05-11 DIAGNOSIS — Z466 Encounter for fitting and adjustment of urinary device: Secondary | ICD-10-CM

## 2018-05-11 DIAGNOSIS — C7989 Secondary malignant neoplasm of other specified sites: Secondary | ICD-10-CM

## 2018-05-11 DIAGNOSIS — Z170 Estrogen receptor positive status [ER+]: Secondary | ICD-10-CM

## 2018-05-11 DIAGNOSIS — G893 Neoplasm related pain (acute) (chronic): Secondary | ICD-10-CM

## 2018-05-11 DIAGNOSIS — C50912 Malignant neoplasm of unspecified site of left female breast: Principal | ICD-10-CM

## 2018-05-11 DIAGNOSIS — K7290 Hepatic failure, unspecified without coma: Secondary | ICD-10-CM

## 2018-05-11 DIAGNOSIS — C7951 Secondary malignant neoplasm of bone: Secondary | ICD-10-CM

## 2018-05-11 DIAGNOSIS — J90 Pleural effusion, not elsewhere classified: Secondary | ICD-10-CM

## 2018-05-11 DIAGNOSIS — R188 Other ascites: Secondary | ICD-10-CM

## 2018-05-11 DIAGNOSIS — I890 Lymphedema, not elsewhere classified: Secondary | ICD-10-CM

## 2018-05-11 DIAGNOSIS — Z9221 Personal history of antineoplastic chemotherapy: Secondary | ICD-10-CM

## 2018-05-11 DIAGNOSIS — Z9012 Acquired absence of left breast and nipple: Secondary | ICD-10-CM

## 2018-05-13 MED ORDER — MORPHINE CONCENTRATE 100 MG/5 ML (20 MG/ML) ORAL SOLUTION
ORAL | 0 refills | 0 days | Status: CP | PRN
Start: 2018-05-13 — End: 2018-05-30

## 2018-05-14 DIAGNOSIS — K766 Portal hypertension: Secondary | ICD-10-CM

## 2018-05-14 DIAGNOSIS — C787 Secondary malignant neoplasm of liver and intrahepatic bile duct: Secondary | ICD-10-CM

## 2018-05-14 DIAGNOSIS — F3341 Major depressive disorder, recurrent, in partial remission: Secondary | ICD-10-CM

## 2018-05-14 DIAGNOSIS — K7290 Hepatic failure, unspecified without coma: Secondary | ICD-10-CM

## 2018-05-14 DIAGNOSIS — R601 Generalized edema: Secondary | ICD-10-CM

## 2018-05-14 DIAGNOSIS — Z87891 Personal history of nicotine dependence: Secondary | ICD-10-CM

## 2018-05-14 DIAGNOSIS — Z9221 Personal history of antineoplastic chemotherapy: Secondary | ICD-10-CM

## 2018-05-14 DIAGNOSIS — Z466 Encounter for fitting and adjustment of urinary device: Secondary | ICD-10-CM

## 2018-05-14 DIAGNOSIS — I890 Lymphedema, not elsewhere classified: Secondary | ICD-10-CM

## 2018-05-14 DIAGNOSIS — E8809 Other disorders of plasma-protein metabolism, not elsewhere classified: Secondary | ICD-10-CM

## 2018-05-14 DIAGNOSIS — G893 Neoplasm related pain (acute) (chronic): Secondary | ICD-10-CM

## 2018-05-14 DIAGNOSIS — J90 Pleural effusion, not elsewhere classified: Secondary | ICD-10-CM

## 2018-05-14 DIAGNOSIS — Z66 Do not resuscitate: Secondary | ICD-10-CM

## 2018-05-14 DIAGNOSIS — C50912 Malignant neoplasm of unspecified site of left female breast: Principal | ICD-10-CM

## 2018-05-14 DIAGNOSIS — C7989 Secondary malignant neoplasm of other specified sites: Secondary | ICD-10-CM

## 2018-05-14 DIAGNOSIS — Z9012 Acquired absence of left breast and nipple: Secondary | ICD-10-CM

## 2018-05-14 DIAGNOSIS — R188 Other ascites: Secondary | ICD-10-CM

## 2018-05-14 DIAGNOSIS — Z170 Estrogen receptor positive status [ER+]: Secondary | ICD-10-CM

## 2018-05-14 DIAGNOSIS — C7951 Secondary malignant neoplasm of bone: Secondary | ICD-10-CM

## 2018-05-15 DIAGNOSIS — C7951 Secondary malignant neoplasm of bone: Secondary | ICD-10-CM

## 2018-05-15 DIAGNOSIS — F3341 Major depressive disorder, recurrent, in partial remission: Secondary | ICD-10-CM

## 2018-05-15 DIAGNOSIS — E8809 Other disorders of plasma-protein metabolism, not elsewhere classified: Secondary | ICD-10-CM

## 2018-05-15 DIAGNOSIS — Z66 Do not resuscitate: Secondary | ICD-10-CM

## 2018-05-15 DIAGNOSIS — Z466 Encounter for fitting and adjustment of urinary device: Secondary | ICD-10-CM

## 2018-05-15 DIAGNOSIS — Z170 Estrogen receptor positive status [ER+]: Secondary | ICD-10-CM

## 2018-05-15 DIAGNOSIS — Z9221 Personal history of antineoplastic chemotherapy: Secondary | ICD-10-CM

## 2018-05-15 DIAGNOSIS — G893 Neoplasm related pain (acute) (chronic): Secondary | ICD-10-CM

## 2018-05-15 DIAGNOSIS — Z87891 Personal history of nicotine dependence: Secondary | ICD-10-CM

## 2018-05-15 DIAGNOSIS — K766 Portal hypertension: Secondary | ICD-10-CM

## 2018-05-15 DIAGNOSIS — K7290 Hepatic failure, unspecified without coma: Secondary | ICD-10-CM

## 2018-05-15 DIAGNOSIS — J90 Pleural effusion, not elsewhere classified: Secondary | ICD-10-CM

## 2018-05-15 DIAGNOSIS — Z9012 Acquired absence of left breast and nipple: Secondary | ICD-10-CM

## 2018-05-15 DIAGNOSIS — C787 Secondary malignant neoplasm of liver and intrahepatic bile duct: Secondary | ICD-10-CM

## 2018-05-15 DIAGNOSIS — C7989 Secondary malignant neoplasm of other specified sites: Secondary | ICD-10-CM

## 2018-05-15 DIAGNOSIS — C50912 Malignant neoplasm of unspecified site of left female breast: Principal | ICD-10-CM

## 2018-05-15 DIAGNOSIS — R601 Generalized edema: Secondary | ICD-10-CM

## 2018-05-15 DIAGNOSIS — R188 Other ascites: Secondary | ICD-10-CM

## 2018-05-15 DIAGNOSIS — I890 Lymphedema, not elsewhere classified: Secondary | ICD-10-CM

## 2018-05-16 DIAGNOSIS — Z87891 Personal history of nicotine dependence: Secondary | ICD-10-CM

## 2018-05-16 DIAGNOSIS — C7951 Secondary malignant neoplasm of bone: Secondary | ICD-10-CM

## 2018-05-16 DIAGNOSIS — K766 Portal hypertension: Secondary | ICD-10-CM

## 2018-05-16 DIAGNOSIS — K7290 Hepatic failure, unspecified without coma: Secondary | ICD-10-CM

## 2018-05-16 DIAGNOSIS — J90 Pleural effusion, not elsewhere classified: Secondary | ICD-10-CM

## 2018-05-16 DIAGNOSIS — C50912 Malignant neoplasm of unspecified site of left female breast: Principal | ICD-10-CM

## 2018-05-16 DIAGNOSIS — C7989 Secondary malignant neoplasm of other specified sites: Secondary | ICD-10-CM

## 2018-05-16 DIAGNOSIS — Z9012 Acquired absence of left breast and nipple: Secondary | ICD-10-CM

## 2018-05-16 DIAGNOSIS — R601 Generalized edema: Secondary | ICD-10-CM

## 2018-05-16 DIAGNOSIS — F3341 Major depressive disorder, recurrent, in partial remission: Secondary | ICD-10-CM

## 2018-05-16 DIAGNOSIS — Z66 Do not resuscitate: Secondary | ICD-10-CM

## 2018-05-16 DIAGNOSIS — Z170 Estrogen receptor positive status [ER+]: Secondary | ICD-10-CM

## 2018-05-16 DIAGNOSIS — E8809 Other disorders of plasma-protein metabolism, not elsewhere classified: Secondary | ICD-10-CM

## 2018-05-16 DIAGNOSIS — C787 Secondary malignant neoplasm of liver and intrahepatic bile duct: Secondary | ICD-10-CM

## 2018-05-16 DIAGNOSIS — Z466 Encounter for fitting and adjustment of urinary device: Secondary | ICD-10-CM

## 2018-05-16 DIAGNOSIS — I890 Lymphedema, not elsewhere classified: Secondary | ICD-10-CM

## 2018-05-16 DIAGNOSIS — G893 Neoplasm related pain (acute) (chronic): Secondary | ICD-10-CM

## 2018-05-16 DIAGNOSIS — R188 Other ascites: Secondary | ICD-10-CM

## 2018-05-16 DIAGNOSIS — Z9221 Personal history of antineoplastic chemotherapy: Secondary | ICD-10-CM

## 2018-05-18 DIAGNOSIS — K766 Portal hypertension: Secondary | ICD-10-CM

## 2018-05-18 DIAGNOSIS — Z66 Do not resuscitate: Secondary | ICD-10-CM

## 2018-05-18 DIAGNOSIS — G893 Neoplasm related pain (acute) (chronic): Secondary | ICD-10-CM

## 2018-05-18 DIAGNOSIS — J90 Pleural effusion, not elsewhere classified: Secondary | ICD-10-CM

## 2018-05-18 DIAGNOSIS — Z170 Estrogen receptor positive status [ER+]: Secondary | ICD-10-CM

## 2018-05-18 DIAGNOSIS — Z9012 Acquired absence of left breast and nipple: Secondary | ICD-10-CM

## 2018-05-18 DIAGNOSIS — R188 Other ascites: Secondary | ICD-10-CM

## 2018-05-18 DIAGNOSIS — Z87891 Personal history of nicotine dependence: Secondary | ICD-10-CM

## 2018-05-18 DIAGNOSIS — E8809 Other disorders of plasma-protein metabolism, not elsewhere classified: Secondary | ICD-10-CM

## 2018-05-18 DIAGNOSIS — C50912 Malignant neoplasm of unspecified site of left female breast: Principal | ICD-10-CM

## 2018-05-18 DIAGNOSIS — C7951 Secondary malignant neoplasm of bone: Secondary | ICD-10-CM

## 2018-05-18 DIAGNOSIS — C787 Secondary malignant neoplasm of liver and intrahepatic bile duct: Secondary | ICD-10-CM

## 2018-05-18 DIAGNOSIS — F3341 Major depressive disorder, recurrent, in partial remission: Secondary | ICD-10-CM

## 2018-05-18 DIAGNOSIS — C7989 Secondary malignant neoplasm of other specified sites: Secondary | ICD-10-CM

## 2018-05-18 DIAGNOSIS — K7290 Hepatic failure, unspecified without coma: Secondary | ICD-10-CM

## 2018-05-18 DIAGNOSIS — R601 Generalized edema: Secondary | ICD-10-CM

## 2018-05-18 DIAGNOSIS — Z466 Encounter for fitting and adjustment of urinary device: Secondary | ICD-10-CM

## 2018-05-18 DIAGNOSIS — I890 Lymphedema, not elsewhere classified: Secondary | ICD-10-CM

## 2018-05-18 DIAGNOSIS — Z9221 Personal history of antineoplastic chemotherapy: Secondary | ICD-10-CM

## 2018-05-21 DIAGNOSIS — C7951 Secondary malignant neoplasm of bone: Secondary | ICD-10-CM

## 2018-05-21 DIAGNOSIS — C787 Secondary malignant neoplasm of liver and intrahepatic bile duct: Secondary | ICD-10-CM

## 2018-05-21 DIAGNOSIS — Z66 Do not resuscitate: Secondary | ICD-10-CM

## 2018-05-21 DIAGNOSIS — R601 Generalized edema: Secondary | ICD-10-CM

## 2018-05-21 DIAGNOSIS — G893 Neoplasm related pain (acute) (chronic): Secondary | ICD-10-CM

## 2018-05-21 DIAGNOSIS — Z9221 Personal history of antineoplastic chemotherapy: Secondary | ICD-10-CM

## 2018-05-21 DIAGNOSIS — I890 Lymphedema, not elsewhere classified: Secondary | ICD-10-CM

## 2018-05-21 DIAGNOSIS — C7989 Secondary malignant neoplasm of other specified sites: Secondary | ICD-10-CM

## 2018-05-21 DIAGNOSIS — Z170 Estrogen receptor positive status [ER+]: Secondary | ICD-10-CM

## 2018-05-21 DIAGNOSIS — J90 Pleural effusion, not elsewhere classified: Secondary | ICD-10-CM

## 2018-05-21 DIAGNOSIS — K766 Portal hypertension: Secondary | ICD-10-CM

## 2018-05-21 DIAGNOSIS — Z87891 Personal history of nicotine dependence: Secondary | ICD-10-CM

## 2018-05-21 DIAGNOSIS — E8809 Other disorders of plasma-protein metabolism, not elsewhere classified: Secondary | ICD-10-CM

## 2018-05-21 DIAGNOSIS — C50912 Malignant neoplasm of unspecified site of left female breast: Principal | ICD-10-CM

## 2018-05-21 DIAGNOSIS — Z466 Encounter for fitting and adjustment of urinary device: Secondary | ICD-10-CM

## 2018-05-21 DIAGNOSIS — F3341 Major depressive disorder, recurrent, in partial remission: Secondary | ICD-10-CM

## 2018-05-21 DIAGNOSIS — Z9012 Acquired absence of left breast and nipple: Secondary | ICD-10-CM

## 2018-05-21 DIAGNOSIS — K7290 Hepatic failure, unspecified without coma: Secondary | ICD-10-CM

## 2018-05-21 DIAGNOSIS — R188 Other ascites: Secondary | ICD-10-CM

## 2018-05-22 DIAGNOSIS — C7951 Secondary malignant neoplasm of bone: Secondary | ICD-10-CM

## 2018-05-22 DIAGNOSIS — R188 Other ascites: Secondary | ICD-10-CM

## 2018-05-22 DIAGNOSIS — Z87891 Personal history of nicotine dependence: Secondary | ICD-10-CM

## 2018-05-22 DIAGNOSIS — E8809 Other disorders of plasma-protein metabolism, not elsewhere classified: Secondary | ICD-10-CM

## 2018-05-22 DIAGNOSIS — I890 Lymphedema, not elsewhere classified: Secondary | ICD-10-CM

## 2018-05-22 DIAGNOSIS — J90 Pleural effusion, not elsewhere classified: Secondary | ICD-10-CM

## 2018-05-22 DIAGNOSIS — C50912 Malignant neoplasm of unspecified site of left female breast: Principal | ICD-10-CM

## 2018-05-22 DIAGNOSIS — Z9012 Acquired absence of left breast and nipple: Secondary | ICD-10-CM

## 2018-05-22 DIAGNOSIS — Z466 Encounter for fitting and adjustment of urinary device: Secondary | ICD-10-CM

## 2018-05-22 DIAGNOSIS — Z66 Do not resuscitate: Secondary | ICD-10-CM

## 2018-05-22 DIAGNOSIS — Z170 Estrogen receptor positive status [ER+]: Secondary | ICD-10-CM

## 2018-05-22 DIAGNOSIS — C7989 Secondary malignant neoplasm of other specified sites: Secondary | ICD-10-CM

## 2018-05-22 DIAGNOSIS — K7290 Hepatic failure, unspecified without coma: Secondary | ICD-10-CM

## 2018-05-22 DIAGNOSIS — C787 Secondary malignant neoplasm of liver and intrahepatic bile duct: Secondary | ICD-10-CM

## 2018-05-22 DIAGNOSIS — G893 Neoplasm related pain (acute) (chronic): Secondary | ICD-10-CM

## 2018-05-22 DIAGNOSIS — F3341 Major depressive disorder, recurrent, in partial remission: Secondary | ICD-10-CM

## 2018-05-22 DIAGNOSIS — R601 Generalized edema: Secondary | ICD-10-CM

## 2018-05-22 DIAGNOSIS — K766 Portal hypertension: Secondary | ICD-10-CM

## 2018-05-22 DIAGNOSIS — Z9221 Personal history of antineoplastic chemotherapy: Secondary | ICD-10-CM

## 2018-05-22 MED ORDER — RANITIDINE 300 MG TABLET
ORAL_TABLET | 3 refills | 0 days | Status: CP
Start: 2018-05-22 — End: 2018-06-21

## 2018-05-23 DIAGNOSIS — I890 Lymphedema, not elsewhere classified: Secondary | ICD-10-CM

## 2018-05-23 DIAGNOSIS — Z87891 Personal history of nicotine dependence: Secondary | ICD-10-CM

## 2018-05-23 DIAGNOSIS — Z466 Encounter for fitting and adjustment of urinary device: Secondary | ICD-10-CM

## 2018-05-23 DIAGNOSIS — C7989 Secondary malignant neoplasm of other specified sites: Secondary | ICD-10-CM

## 2018-05-23 DIAGNOSIS — Z170 Estrogen receptor positive status [ER+]: Secondary | ICD-10-CM

## 2018-05-23 DIAGNOSIS — E8809 Other disorders of plasma-protein metabolism, not elsewhere classified: Secondary | ICD-10-CM

## 2018-05-23 DIAGNOSIS — F3341 Major depressive disorder, recurrent, in partial remission: Secondary | ICD-10-CM

## 2018-05-23 DIAGNOSIS — G893 Neoplasm related pain (acute) (chronic): Secondary | ICD-10-CM

## 2018-05-23 DIAGNOSIS — Z9221 Personal history of antineoplastic chemotherapy: Secondary | ICD-10-CM

## 2018-05-23 DIAGNOSIS — C7951 Secondary malignant neoplasm of bone: Secondary | ICD-10-CM

## 2018-05-23 DIAGNOSIS — C50912 Malignant neoplasm of unspecified site of left female breast: Principal | ICD-10-CM

## 2018-05-23 DIAGNOSIS — R188 Other ascites: Secondary | ICD-10-CM

## 2018-05-23 DIAGNOSIS — K7290 Hepatic failure, unspecified without coma: Secondary | ICD-10-CM

## 2018-05-23 DIAGNOSIS — Z66 Do not resuscitate: Secondary | ICD-10-CM

## 2018-05-23 DIAGNOSIS — K766 Portal hypertension: Secondary | ICD-10-CM

## 2018-05-23 DIAGNOSIS — J90 Pleural effusion, not elsewhere classified: Secondary | ICD-10-CM

## 2018-05-23 DIAGNOSIS — Z9012 Acquired absence of left breast and nipple: Secondary | ICD-10-CM

## 2018-05-23 DIAGNOSIS — C787 Secondary malignant neoplasm of liver and intrahepatic bile duct: Secondary | ICD-10-CM

## 2018-05-23 DIAGNOSIS — R601 Generalized edema: Secondary | ICD-10-CM

## 2018-05-25 DIAGNOSIS — E8809 Other disorders of plasma-protein metabolism, not elsewhere classified: Secondary | ICD-10-CM

## 2018-05-25 DIAGNOSIS — F3341 Major depressive disorder, recurrent, in partial remission: Secondary | ICD-10-CM

## 2018-05-25 DIAGNOSIS — C50912 Malignant neoplasm of unspecified site of left female breast: Principal | ICD-10-CM

## 2018-05-25 DIAGNOSIS — K7290 Hepatic failure, unspecified without coma: Secondary | ICD-10-CM

## 2018-05-25 DIAGNOSIS — I890 Lymphedema, not elsewhere classified: Secondary | ICD-10-CM

## 2018-05-25 DIAGNOSIS — R188 Other ascites: Secondary | ICD-10-CM

## 2018-05-25 DIAGNOSIS — Z170 Estrogen receptor positive status [ER+]: Secondary | ICD-10-CM

## 2018-05-25 DIAGNOSIS — C7989 Secondary malignant neoplasm of other specified sites: Secondary | ICD-10-CM

## 2018-05-25 DIAGNOSIS — G893 Neoplasm related pain (acute) (chronic): Secondary | ICD-10-CM

## 2018-05-25 DIAGNOSIS — Z466 Encounter for fitting and adjustment of urinary device: Secondary | ICD-10-CM

## 2018-05-25 DIAGNOSIS — R601 Generalized edema: Secondary | ICD-10-CM

## 2018-05-25 DIAGNOSIS — Z66 Do not resuscitate: Secondary | ICD-10-CM

## 2018-05-25 DIAGNOSIS — C787 Secondary malignant neoplasm of liver and intrahepatic bile duct: Secondary | ICD-10-CM

## 2018-05-25 DIAGNOSIS — Z9221 Personal history of antineoplastic chemotherapy: Secondary | ICD-10-CM

## 2018-05-25 DIAGNOSIS — J90 Pleural effusion, not elsewhere classified: Secondary | ICD-10-CM

## 2018-05-25 DIAGNOSIS — C7951 Secondary malignant neoplasm of bone: Secondary | ICD-10-CM

## 2018-05-25 DIAGNOSIS — Z9012 Acquired absence of left breast and nipple: Secondary | ICD-10-CM

## 2018-05-25 DIAGNOSIS — Z87891 Personal history of nicotine dependence: Secondary | ICD-10-CM

## 2018-05-25 DIAGNOSIS — K766 Portal hypertension: Secondary | ICD-10-CM

## 2018-05-28 DIAGNOSIS — Z66 Do not resuscitate: Secondary | ICD-10-CM

## 2018-05-28 DIAGNOSIS — E8809 Other disorders of plasma-protein metabolism, not elsewhere classified: Secondary | ICD-10-CM

## 2018-05-28 DIAGNOSIS — C7951 Secondary malignant neoplasm of bone: Secondary | ICD-10-CM

## 2018-05-28 DIAGNOSIS — F3341 Major depressive disorder, recurrent, in partial remission: Secondary | ICD-10-CM

## 2018-05-28 DIAGNOSIS — G893 Neoplasm related pain (acute) (chronic): Secondary | ICD-10-CM

## 2018-05-28 DIAGNOSIS — K7290 Hepatic failure, unspecified without coma: Secondary | ICD-10-CM

## 2018-05-28 DIAGNOSIS — Z9221 Personal history of antineoplastic chemotherapy: Secondary | ICD-10-CM

## 2018-05-28 DIAGNOSIS — Z170 Estrogen receptor positive status [ER+]: Secondary | ICD-10-CM

## 2018-05-28 DIAGNOSIS — I890 Lymphedema, not elsewhere classified: Secondary | ICD-10-CM

## 2018-05-28 DIAGNOSIS — J90 Pleural effusion, not elsewhere classified: Secondary | ICD-10-CM

## 2018-05-28 DIAGNOSIS — Z466 Encounter for fitting and adjustment of urinary device: Secondary | ICD-10-CM

## 2018-05-28 DIAGNOSIS — C7989 Secondary malignant neoplasm of other specified sites: Secondary | ICD-10-CM

## 2018-05-28 DIAGNOSIS — K766 Portal hypertension: Secondary | ICD-10-CM

## 2018-05-28 DIAGNOSIS — Z9012 Acquired absence of left breast and nipple: Secondary | ICD-10-CM

## 2018-05-28 DIAGNOSIS — R188 Other ascites: Secondary | ICD-10-CM

## 2018-05-28 DIAGNOSIS — C787 Secondary malignant neoplasm of liver and intrahepatic bile duct: Secondary | ICD-10-CM

## 2018-05-28 DIAGNOSIS — C50912 Malignant neoplasm of unspecified site of left female breast: Principal | ICD-10-CM

## 2018-05-28 DIAGNOSIS — Z87891 Personal history of nicotine dependence: Secondary | ICD-10-CM

## 2018-05-28 DIAGNOSIS — R601 Generalized edema: Secondary | ICD-10-CM

## 2018-05-29 DIAGNOSIS — C50912 Malignant neoplasm of unspecified site of left female breast: Principal | ICD-10-CM

## 2018-05-29 DIAGNOSIS — Z170 Estrogen receptor positive status [ER+]: Secondary | ICD-10-CM

## 2018-05-29 DIAGNOSIS — Z466 Encounter for fitting and adjustment of urinary device: Secondary | ICD-10-CM

## 2018-05-29 DIAGNOSIS — Z87891 Personal history of nicotine dependence: Secondary | ICD-10-CM

## 2018-05-29 DIAGNOSIS — K766 Portal hypertension: Secondary | ICD-10-CM

## 2018-05-29 DIAGNOSIS — I890 Lymphedema, not elsewhere classified: Secondary | ICD-10-CM

## 2018-05-29 DIAGNOSIS — Z9221 Personal history of antineoplastic chemotherapy: Secondary | ICD-10-CM

## 2018-05-29 DIAGNOSIS — Z66 Do not resuscitate: Secondary | ICD-10-CM

## 2018-05-29 DIAGNOSIS — R601 Generalized edema: Secondary | ICD-10-CM

## 2018-05-29 DIAGNOSIS — K7290 Hepatic failure, unspecified without coma: Secondary | ICD-10-CM

## 2018-05-29 DIAGNOSIS — C7989 Secondary malignant neoplasm of other specified sites: Secondary | ICD-10-CM

## 2018-05-29 DIAGNOSIS — J90 Pleural effusion, not elsewhere classified: Secondary | ICD-10-CM

## 2018-05-29 DIAGNOSIS — G893 Neoplasm related pain (acute) (chronic): Secondary | ICD-10-CM

## 2018-05-29 DIAGNOSIS — F3341 Major depressive disorder, recurrent, in partial remission: Secondary | ICD-10-CM

## 2018-05-29 DIAGNOSIS — R188 Other ascites: Secondary | ICD-10-CM

## 2018-05-29 DIAGNOSIS — C7951 Secondary malignant neoplasm of bone: Secondary | ICD-10-CM

## 2018-05-29 DIAGNOSIS — Z9012 Acquired absence of left breast and nipple: Secondary | ICD-10-CM

## 2018-05-29 DIAGNOSIS — C787 Secondary malignant neoplasm of liver and intrahepatic bile duct: Secondary | ICD-10-CM

## 2018-05-29 DIAGNOSIS — E8809 Other disorders of plasma-protein metabolism, not elsewhere classified: Secondary | ICD-10-CM

## 2018-05-30 DIAGNOSIS — Z9012 Acquired absence of left breast and nipple: Secondary | ICD-10-CM

## 2018-05-30 DIAGNOSIS — G893 Neoplasm related pain (acute) (chronic): Secondary | ICD-10-CM

## 2018-05-30 DIAGNOSIS — Z466 Encounter for fitting and adjustment of urinary device: Secondary | ICD-10-CM

## 2018-05-30 DIAGNOSIS — Z170 Estrogen receptor positive status [ER+]: Secondary | ICD-10-CM

## 2018-05-30 DIAGNOSIS — K766 Portal hypertension: Secondary | ICD-10-CM

## 2018-05-30 DIAGNOSIS — R601 Generalized edema: Secondary | ICD-10-CM

## 2018-05-30 DIAGNOSIS — C50912 Malignant neoplasm of unspecified site of left female breast: Principal | ICD-10-CM

## 2018-05-30 DIAGNOSIS — Z9221 Personal history of antineoplastic chemotherapy: Secondary | ICD-10-CM

## 2018-05-30 DIAGNOSIS — K7290 Hepatic failure, unspecified without coma: Secondary | ICD-10-CM

## 2018-05-30 DIAGNOSIS — J90 Pleural effusion, not elsewhere classified: Secondary | ICD-10-CM

## 2018-05-30 DIAGNOSIS — Z87891 Personal history of nicotine dependence: Secondary | ICD-10-CM

## 2018-05-30 DIAGNOSIS — E8809 Other disorders of plasma-protein metabolism, not elsewhere classified: Secondary | ICD-10-CM

## 2018-05-30 DIAGNOSIS — C7951 Secondary malignant neoplasm of bone: Secondary | ICD-10-CM

## 2018-05-30 DIAGNOSIS — Z66 Do not resuscitate: Secondary | ICD-10-CM

## 2018-05-30 DIAGNOSIS — C7989 Secondary malignant neoplasm of other specified sites: Secondary | ICD-10-CM

## 2018-05-30 DIAGNOSIS — R188 Other ascites: Secondary | ICD-10-CM

## 2018-05-30 DIAGNOSIS — I890 Lymphedema, not elsewhere classified: Secondary | ICD-10-CM

## 2018-05-30 DIAGNOSIS — C787 Secondary malignant neoplasm of liver and intrahepatic bile duct: Secondary | ICD-10-CM

## 2018-05-30 DIAGNOSIS — F3341 Major depressive disorder, recurrent, in partial remission: Secondary | ICD-10-CM

## 2018-05-30 MED ORDER — MORPHINE CONCENTRATE 100 MG/5 ML (20 MG/ML) ORAL SOLUTION
ORAL | 0 refills | 0 days | Status: CP | PRN
Start: 2018-05-30 — End: ?

## 2018-05-30 MED ORDER — ARIPIPRAZOLE 5 MG TABLET
ORAL_TABLET | 0 refills | 0 days | Status: CP
Start: 2018-05-30 — End: 2018-06-21

## 2018-06-01 DIAGNOSIS — K7290 Hepatic failure, unspecified without coma: Secondary | ICD-10-CM

## 2018-06-01 DIAGNOSIS — Z170 Estrogen receptor positive status [ER+]: Secondary | ICD-10-CM

## 2018-06-01 DIAGNOSIS — Z66 Do not resuscitate: Secondary | ICD-10-CM

## 2018-06-01 DIAGNOSIS — Z87891 Personal history of nicotine dependence: Secondary | ICD-10-CM

## 2018-06-01 DIAGNOSIS — C787 Secondary malignant neoplasm of liver and intrahepatic bile duct: Secondary | ICD-10-CM

## 2018-06-01 DIAGNOSIS — G893 Neoplasm related pain (acute) (chronic): Secondary | ICD-10-CM

## 2018-06-01 DIAGNOSIS — J90 Pleural effusion, not elsewhere classified: Secondary | ICD-10-CM

## 2018-06-01 DIAGNOSIS — E8809 Other disorders of plasma-protein metabolism, not elsewhere classified: Secondary | ICD-10-CM

## 2018-06-01 DIAGNOSIS — Z9012 Acquired absence of left breast and nipple: Secondary | ICD-10-CM

## 2018-06-01 DIAGNOSIS — R188 Other ascites: Secondary | ICD-10-CM

## 2018-06-01 DIAGNOSIS — C50912 Malignant neoplasm of unspecified site of left female breast: Principal | ICD-10-CM

## 2018-06-01 DIAGNOSIS — C7989 Secondary malignant neoplasm of other specified sites: Secondary | ICD-10-CM

## 2018-06-01 DIAGNOSIS — K766 Portal hypertension: Secondary | ICD-10-CM

## 2018-06-01 DIAGNOSIS — Z466 Encounter for fitting and adjustment of urinary device: Secondary | ICD-10-CM

## 2018-06-01 DIAGNOSIS — F3341 Major depressive disorder, recurrent, in partial remission: Secondary | ICD-10-CM

## 2018-06-01 DIAGNOSIS — C7951 Secondary malignant neoplasm of bone: Secondary | ICD-10-CM

## 2018-06-01 DIAGNOSIS — Z9221 Personal history of antineoplastic chemotherapy: Secondary | ICD-10-CM

## 2018-06-01 DIAGNOSIS — R601 Generalized edema: Secondary | ICD-10-CM

## 2018-06-01 DIAGNOSIS — I890 Lymphedema, not elsewhere classified: Secondary | ICD-10-CM

## 2018-06-03 DIAGNOSIS — Z466 Encounter for fitting and adjustment of urinary device: Secondary | ICD-10-CM

## 2018-06-03 DIAGNOSIS — R601 Generalized edema: Secondary | ICD-10-CM

## 2018-06-03 DIAGNOSIS — Z87891 Personal history of nicotine dependence: Secondary | ICD-10-CM

## 2018-06-03 DIAGNOSIS — F3341 Major depressive disorder, recurrent, in partial remission: Secondary | ICD-10-CM

## 2018-06-03 DIAGNOSIS — C7989 Secondary malignant neoplasm of other specified sites: Secondary | ICD-10-CM

## 2018-06-03 DIAGNOSIS — K7290 Hepatic failure, unspecified without coma: Secondary | ICD-10-CM

## 2018-06-03 DIAGNOSIS — Z170 Estrogen receptor positive status [ER+]: Secondary | ICD-10-CM

## 2018-06-03 DIAGNOSIS — C50912 Malignant neoplasm of unspecified site of left female breast: Principal | ICD-10-CM

## 2018-06-03 DIAGNOSIS — J90 Pleural effusion, not elsewhere classified: Secondary | ICD-10-CM

## 2018-06-03 DIAGNOSIS — K766 Portal hypertension: Secondary | ICD-10-CM

## 2018-06-03 DIAGNOSIS — R188 Other ascites: Secondary | ICD-10-CM

## 2018-06-03 DIAGNOSIS — E8809 Other disorders of plasma-protein metabolism, not elsewhere classified: Secondary | ICD-10-CM

## 2018-06-03 DIAGNOSIS — C7951 Secondary malignant neoplasm of bone: Secondary | ICD-10-CM

## 2018-06-03 DIAGNOSIS — G893 Neoplasm related pain (acute) (chronic): Secondary | ICD-10-CM

## 2018-06-03 DIAGNOSIS — Z9012 Acquired absence of left breast and nipple: Secondary | ICD-10-CM

## 2018-06-03 DIAGNOSIS — C787 Secondary malignant neoplasm of liver and intrahepatic bile duct: Secondary | ICD-10-CM

## 2018-06-03 DIAGNOSIS — I890 Lymphedema, not elsewhere classified: Secondary | ICD-10-CM

## 2018-06-03 DIAGNOSIS — Z9221 Personal history of antineoplastic chemotherapy: Secondary | ICD-10-CM

## 2018-06-03 DIAGNOSIS — Z66 Do not resuscitate: Secondary | ICD-10-CM

## 2018-06-04 ENCOUNTER — Encounter: Admit: 2018-06-04 | Discharge: 2018-06-10 | Payer: MEDICARE

## 2018-06-04 ENCOUNTER — Encounter: Admit: 2018-06-04 | Discharge: 2018-06-10 | Payer: BLUE CROSS/BLUE SHIELD

## 2018-06-04 DIAGNOSIS — I890 Lymphedema, not elsewhere classified: Secondary | ICD-10-CM

## 2018-06-04 DIAGNOSIS — K766 Portal hypertension: Secondary | ICD-10-CM

## 2018-06-04 DIAGNOSIS — Z466 Encounter for fitting and adjustment of urinary device: Secondary | ICD-10-CM

## 2018-06-04 DIAGNOSIS — Z66 Do not resuscitate: Secondary | ICD-10-CM

## 2018-06-04 DIAGNOSIS — Z9012 Acquired absence of left breast and nipple: Secondary | ICD-10-CM

## 2018-06-04 DIAGNOSIS — C50912 Malignant neoplasm of unspecified site of left female breast: Principal | ICD-10-CM

## 2018-06-04 DIAGNOSIS — K7290 Hepatic failure, unspecified without coma: Secondary | ICD-10-CM

## 2018-06-04 DIAGNOSIS — G893 Neoplasm related pain (acute) (chronic): Secondary | ICD-10-CM

## 2018-06-04 DIAGNOSIS — R601 Generalized edema: Secondary | ICD-10-CM

## 2018-06-04 DIAGNOSIS — Z9221 Personal history of antineoplastic chemotherapy: Secondary | ICD-10-CM

## 2018-06-04 DIAGNOSIS — E8809 Other disorders of plasma-protein metabolism, not elsewhere classified: Secondary | ICD-10-CM

## 2018-06-04 DIAGNOSIS — Z87891 Personal history of nicotine dependence: Secondary | ICD-10-CM

## 2018-06-04 DIAGNOSIS — C7951 Secondary malignant neoplasm of bone: Secondary | ICD-10-CM

## 2018-06-04 DIAGNOSIS — Z170 Estrogen receptor positive status [ER+]: Secondary | ICD-10-CM

## 2018-06-04 DIAGNOSIS — F3341 Major depressive disorder, recurrent, in partial remission: Secondary | ICD-10-CM

## 2018-06-04 DIAGNOSIS — C787 Secondary malignant neoplasm of liver and intrahepatic bile duct: Secondary | ICD-10-CM

## 2018-06-04 DIAGNOSIS — R188 Other ascites: Secondary | ICD-10-CM

## 2018-06-04 DIAGNOSIS — C7989 Secondary malignant neoplasm of other specified sites: Secondary | ICD-10-CM

## 2018-06-04 DIAGNOSIS — J90 Pleural effusion, not elsewhere classified: Secondary | ICD-10-CM

## 2018-06-05 DIAGNOSIS — R188 Other ascites: Secondary | ICD-10-CM

## 2018-06-05 DIAGNOSIS — E8809 Other disorders of plasma-protein metabolism, not elsewhere classified: Secondary | ICD-10-CM

## 2018-06-05 DIAGNOSIS — G893 Neoplasm related pain (acute) (chronic): Secondary | ICD-10-CM

## 2018-06-05 DIAGNOSIS — J90 Pleural effusion, not elsewhere classified: Secondary | ICD-10-CM

## 2018-06-05 DIAGNOSIS — Z87891 Personal history of nicotine dependence: Secondary | ICD-10-CM

## 2018-06-05 DIAGNOSIS — K7290 Hepatic failure, unspecified without coma: Secondary | ICD-10-CM

## 2018-06-05 DIAGNOSIS — Z9012 Acquired absence of left breast and nipple: Secondary | ICD-10-CM

## 2018-06-05 DIAGNOSIS — Z9221 Personal history of antineoplastic chemotherapy: Secondary | ICD-10-CM

## 2018-06-05 DIAGNOSIS — I890 Lymphedema, not elsewhere classified: Secondary | ICD-10-CM

## 2018-06-05 DIAGNOSIS — Z170 Estrogen receptor positive status [ER+]: Secondary | ICD-10-CM

## 2018-06-05 DIAGNOSIS — C7951 Secondary malignant neoplasm of bone: Secondary | ICD-10-CM

## 2018-06-05 DIAGNOSIS — C50912 Malignant neoplasm of unspecified site of left female breast: Principal | ICD-10-CM

## 2018-06-05 DIAGNOSIS — C787 Secondary malignant neoplasm of liver and intrahepatic bile duct: Secondary | ICD-10-CM

## 2018-06-05 DIAGNOSIS — Z66 Do not resuscitate: Secondary | ICD-10-CM

## 2018-06-05 DIAGNOSIS — K766 Portal hypertension: Secondary | ICD-10-CM

## 2018-06-05 DIAGNOSIS — R601 Generalized edema: Secondary | ICD-10-CM

## 2018-06-05 DIAGNOSIS — Z466 Encounter for fitting and adjustment of urinary device: Secondary | ICD-10-CM

## 2018-06-05 DIAGNOSIS — C7989 Secondary malignant neoplasm of other specified sites: Secondary | ICD-10-CM

## 2018-06-05 DIAGNOSIS — F3341 Major depressive disorder, recurrent, in partial remission: Secondary | ICD-10-CM

## 2018-06-05 MED ORDER — FENTANYL 75 MCG/HR TRANSDERMAL PATCH
MEDICATED_PATCH | TRANSDERMAL | 0 refills | 0 days | Status: CP
Start: 2018-06-05 — End: 2018-06-21

## 2018-06-06 DIAGNOSIS — Z66 Do not resuscitate: Secondary | ICD-10-CM

## 2018-06-06 DIAGNOSIS — R188 Other ascites: Secondary | ICD-10-CM

## 2018-06-06 DIAGNOSIS — C787 Secondary malignant neoplasm of liver and intrahepatic bile duct: Secondary | ICD-10-CM

## 2018-06-06 DIAGNOSIS — C50912 Malignant neoplasm of unspecified site of left female breast: Principal | ICD-10-CM

## 2018-06-06 DIAGNOSIS — Z466 Encounter for fitting and adjustment of urinary device: Secondary | ICD-10-CM

## 2018-06-06 DIAGNOSIS — E8809 Other disorders of plasma-protein metabolism, not elsewhere classified: Secondary | ICD-10-CM

## 2018-06-06 DIAGNOSIS — F3341 Major depressive disorder, recurrent, in partial remission: Secondary | ICD-10-CM

## 2018-06-06 DIAGNOSIS — Z170 Estrogen receptor positive status [ER+]: Secondary | ICD-10-CM

## 2018-06-06 DIAGNOSIS — K7290 Hepatic failure, unspecified without coma: Secondary | ICD-10-CM

## 2018-06-06 DIAGNOSIS — Z9221 Personal history of antineoplastic chemotherapy: Secondary | ICD-10-CM

## 2018-06-06 DIAGNOSIS — Z9012 Acquired absence of left breast and nipple: Secondary | ICD-10-CM

## 2018-06-06 DIAGNOSIS — C7951 Secondary malignant neoplasm of bone: Secondary | ICD-10-CM

## 2018-06-06 DIAGNOSIS — R601 Generalized edema: Secondary | ICD-10-CM

## 2018-06-06 DIAGNOSIS — J90 Pleural effusion, not elsewhere classified: Secondary | ICD-10-CM

## 2018-06-06 DIAGNOSIS — Z87891 Personal history of nicotine dependence: Secondary | ICD-10-CM

## 2018-06-06 DIAGNOSIS — C7989 Secondary malignant neoplasm of other specified sites: Secondary | ICD-10-CM

## 2018-06-06 DIAGNOSIS — I890 Lymphedema, not elsewhere classified: Secondary | ICD-10-CM

## 2018-06-06 DIAGNOSIS — G893 Neoplasm related pain (acute) (chronic): Secondary | ICD-10-CM

## 2018-06-06 DIAGNOSIS — K766 Portal hypertension: Secondary | ICD-10-CM

## 2018-06-07 DIAGNOSIS — K7290 Hepatic failure, unspecified without coma: Secondary | ICD-10-CM

## 2018-06-07 DIAGNOSIS — F3341 Major depressive disorder, recurrent, in partial remission: Secondary | ICD-10-CM

## 2018-06-07 DIAGNOSIS — G893 Neoplasm related pain (acute) (chronic): Secondary | ICD-10-CM

## 2018-06-07 DIAGNOSIS — Z466 Encounter for fitting and adjustment of urinary device: Secondary | ICD-10-CM

## 2018-06-07 DIAGNOSIS — I890 Lymphedema, not elsewhere classified: Secondary | ICD-10-CM

## 2018-06-07 DIAGNOSIS — C50912 Malignant neoplasm of unspecified site of left female breast: Principal | ICD-10-CM

## 2018-06-07 DIAGNOSIS — Z9012 Acquired absence of left breast and nipple: Secondary | ICD-10-CM

## 2018-06-07 DIAGNOSIS — Z170 Estrogen receptor positive status [ER+]: Secondary | ICD-10-CM

## 2018-06-07 DIAGNOSIS — J90 Pleural effusion, not elsewhere classified: Secondary | ICD-10-CM

## 2018-06-07 DIAGNOSIS — R601 Generalized edema: Secondary | ICD-10-CM

## 2018-06-07 DIAGNOSIS — C7989 Secondary malignant neoplasm of other specified sites: Secondary | ICD-10-CM

## 2018-06-07 DIAGNOSIS — C787 Secondary malignant neoplasm of liver and intrahepatic bile duct: Secondary | ICD-10-CM

## 2018-06-07 DIAGNOSIS — Z87891 Personal history of nicotine dependence: Secondary | ICD-10-CM

## 2018-06-07 DIAGNOSIS — Z9221 Personal history of antineoplastic chemotherapy: Secondary | ICD-10-CM

## 2018-06-07 DIAGNOSIS — E8809 Other disorders of plasma-protein metabolism, not elsewhere classified: Secondary | ICD-10-CM

## 2018-06-07 DIAGNOSIS — K766 Portal hypertension: Secondary | ICD-10-CM

## 2018-06-07 DIAGNOSIS — Z66 Do not resuscitate: Secondary | ICD-10-CM

## 2018-06-07 DIAGNOSIS — R188 Other ascites: Secondary | ICD-10-CM

## 2018-06-07 DIAGNOSIS — C7951 Secondary malignant neoplasm of bone: Secondary | ICD-10-CM

## 2018-06-08 DIAGNOSIS — Z87891 Personal history of nicotine dependence: Secondary | ICD-10-CM

## 2018-06-08 DIAGNOSIS — Z170 Estrogen receptor positive status [ER+]: Secondary | ICD-10-CM

## 2018-06-08 DIAGNOSIS — K766 Portal hypertension: Secondary | ICD-10-CM

## 2018-06-08 DIAGNOSIS — Z466 Encounter for fitting and adjustment of urinary device: Secondary | ICD-10-CM

## 2018-06-08 DIAGNOSIS — Z9012 Acquired absence of left breast and nipple: Secondary | ICD-10-CM

## 2018-06-08 DIAGNOSIS — J90 Pleural effusion, not elsewhere classified: Secondary | ICD-10-CM

## 2018-06-08 DIAGNOSIS — K7290 Hepatic failure, unspecified without coma: Secondary | ICD-10-CM

## 2018-06-08 DIAGNOSIS — G893 Neoplasm related pain (acute) (chronic): Secondary | ICD-10-CM

## 2018-06-08 DIAGNOSIS — Z9221 Personal history of antineoplastic chemotherapy: Secondary | ICD-10-CM

## 2018-06-08 DIAGNOSIS — Z66 Do not resuscitate: Secondary | ICD-10-CM

## 2018-06-08 DIAGNOSIS — E8809 Other disorders of plasma-protein metabolism, not elsewhere classified: Secondary | ICD-10-CM

## 2018-06-08 DIAGNOSIS — R601 Generalized edema: Secondary | ICD-10-CM

## 2018-06-08 DIAGNOSIS — R188 Other ascites: Secondary | ICD-10-CM

## 2018-06-08 DIAGNOSIS — I890 Lymphedema, not elsewhere classified: Secondary | ICD-10-CM

## 2018-06-08 DIAGNOSIS — F3341 Major depressive disorder, recurrent, in partial remission: Secondary | ICD-10-CM

## 2018-06-08 DIAGNOSIS — C50912 Malignant neoplasm of unspecified site of left female breast: Principal | ICD-10-CM

## 2018-06-08 DIAGNOSIS — C7951 Secondary malignant neoplasm of bone: Secondary | ICD-10-CM

## 2018-06-08 DIAGNOSIS — C787 Secondary malignant neoplasm of liver and intrahepatic bile duct: Secondary | ICD-10-CM

## 2018-06-08 DIAGNOSIS — C7989 Secondary malignant neoplasm of other specified sites: Secondary | ICD-10-CM

## 2018-06-09 DIAGNOSIS — C7989 Secondary malignant neoplasm of other specified sites: Secondary | ICD-10-CM

## 2018-06-09 DIAGNOSIS — R601 Generalized edema: Secondary | ICD-10-CM

## 2018-06-09 DIAGNOSIS — I890 Lymphedema, not elsewhere classified: Secondary | ICD-10-CM

## 2018-06-09 DIAGNOSIS — C7951 Secondary malignant neoplasm of bone: Secondary | ICD-10-CM

## 2018-06-09 DIAGNOSIS — K7290 Hepatic failure, unspecified without coma: Secondary | ICD-10-CM

## 2018-06-09 DIAGNOSIS — Z87891 Personal history of nicotine dependence: Secondary | ICD-10-CM

## 2018-06-09 DIAGNOSIS — C787 Secondary malignant neoplasm of liver and intrahepatic bile duct: Secondary | ICD-10-CM

## 2018-06-09 DIAGNOSIS — E8809 Other disorders of plasma-protein metabolism, not elsewhere classified: Secondary | ICD-10-CM

## 2018-06-09 DIAGNOSIS — Z170 Estrogen receptor positive status [ER+]: Secondary | ICD-10-CM

## 2018-06-09 DIAGNOSIS — Z9012 Acquired absence of left breast and nipple: Secondary | ICD-10-CM

## 2018-06-09 DIAGNOSIS — C50912 Malignant neoplasm of unspecified site of left female breast: Principal | ICD-10-CM

## 2018-06-09 DIAGNOSIS — Z66 Do not resuscitate: Secondary | ICD-10-CM

## 2018-06-09 DIAGNOSIS — K766 Portal hypertension: Secondary | ICD-10-CM

## 2018-06-09 DIAGNOSIS — Z9221 Personal history of antineoplastic chemotherapy: Secondary | ICD-10-CM

## 2018-06-09 DIAGNOSIS — F3341 Major depressive disorder, recurrent, in partial remission: Secondary | ICD-10-CM

## 2018-06-09 DIAGNOSIS — Z466 Encounter for fitting and adjustment of urinary device: Secondary | ICD-10-CM

## 2018-06-09 DIAGNOSIS — R188 Other ascites: Secondary | ICD-10-CM

## 2018-06-09 DIAGNOSIS — G893 Neoplasm related pain (acute) (chronic): Secondary | ICD-10-CM

## 2018-06-09 DIAGNOSIS — J90 Pleural effusion, not elsewhere classified: Secondary | ICD-10-CM

## 2018-06-09 MED ORDER — LORAZEPAM 0.5 MG TABLET
ORAL_TABLET | ORAL | 0 refills | 0 days | Status: CP | PRN
Start: 2018-06-09 — End: 2018-06-09

## 2018-06-09 MED ORDER — LORAZEPAM 2 MG/ML ORAL CONCENTRATE
ORAL | 4 refills | 0 days | Status: CP | PRN
Start: 2018-06-09 — End: ?

## 2018-06-10 DIAGNOSIS — K7290 Hepatic failure, unspecified without coma: Secondary | ICD-10-CM

## 2018-06-10 DIAGNOSIS — R188 Other ascites: Secondary | ICD-10-CM

## 2018-06-10 DIAGNOSIS — C7989 Secondary malignant neoplasm of other specified sites: Secondary | ICD-10-CM

## 2018-06-10 DIAGNOSIS — E8809 Other disorders of plasma-protein metabolism, not elsewhere classified: Secondary | ICD-10-CM

## 2018-06-10 DIAGNOSIS — F3341 Major depressive disorder, recurrent, in partial remission: Secondary | ICD-10-CM

## 2018-06-10 DIAGNOSIS — Z466 Encounter for fitting and adjustment of urinary device: Secondary | ICD-10-CM

## 2018-06-10 DIAGNOSIS — J90 Pleural effusion, not elsewhere classified: Secondary | ICD-10-CM

## 2018-06-10 DIAGNOSIS — C787 Secondary malignant neoplasm of liver and intrahepatic bile duct: Secondary | ICD-10-CM

## 2018-06-10 DIAGNOSIS — C50912 Malignant neoplasm of unspecified site of left female breast: Principal | ICD-10-CM

## 2018-06-10 DIAGNOSIS — Z9221 Personal history of antineoplastic chemotherapy: Secondary | ICD-10-CM

## 2018-06-10 DIAGNOSIS — R601 Generalized edema: Secondary | ICD-10-CM

## 2018-06-10 DIAGNOSIS — K766 Portal hypertension: Secondary | ICD-10-CM

## 2018-06-10 DIAGNOSIS — Z66 Do not resuscitate: Secondary | ICD-10-CM

## 2018-06-10 DIAGNOSIS — I890 Lymphedema, not elsewhere classified: Secondary | ICD-10-CM

## 2018-06-10 DIAGNOSIS — Z9012 Acquired absence of left breast and nipple: Secondary | ICD-10-CM

## 2018-06-10 DIAGNOSIS — Z87891 Personal history of nicotine dependence: Secondary | ICD-10-CM

## 2018-06-10 DIAGNOSIS — C7951 Secondary malignant neoplasm of bone: Secondary | ICD-10-CM

## 2018-06-10 DIAGNOSIS — G893 Neoplasm related pain (acute) (chronic): Secondary | ICD-10-CM

## 2018-06-10 DIAGNOSIS — Z170 Estrogen receptor positive status [ER+]: Secondary | ICD-10-CM

## 2018-06-11 DIAGNOSIS — C50912 Malignant neoplasm of unspecified site of left female breast: Principal | ICD-10-CM

## 2018-06-11 DIAGNOSIS — R188 Other ascites: Secondary | ICD-10-CM

## 2018-06-11 DIAGNOSIS — F3341 Major depressive disorder, recurrent, in partial remission: Secondary | ICD-10-CM

## 2018-06-11 DIAGNOSIS — Z9221 Personal history of antineoplastic chemotherapy: Secondary | ICD-10-CM

## 2018-06-11 DIAGNOSIS — Z66 Do not resuscitate: Secondary | ICD-10-CM

## 2018-06-11 DIAGNOSIS — K766 Portal hypertension: Secondary | ICD-10-CM

## 2018-06-11 DIAGNOSIS — Z466 Encounter for fitting and adjustment of urinary device: Secondary | ICD-10-CM

## 2018-06-11 DIAGNOSIS — C7951 Secondary malignant neoplasm of bone: Secondary | ICD-10-CM

## 2018-06-11 DIAGNOSIS — J90 Pleural effusion, not elsewhere classified: Secondary | ICD-10-CM

## 2018-06-11 DIAGNOSIS — C7989 Secondary malignant neoplasm of other specified sites: Secondary | ICD-10-CM

## 2018-06-11 DIAGNOSIS — Z87891 Personal history of nicotine dependence: Secondary | ICD-10-CM

## 2018-06-11 DIAGNOSIS — I890 Lymphedema, not elsewhere classified: Secondary | ICD-10-CM

## 2018-06-11 DIAGNOSIS — C787 Secondary malignant neoplasm of liver and intrahepatic bile duct: Secondary | ICD-10-CM

## 2018-06-11 DIAGNOSIS — R601 Generalized edema: Secondary | ICD-10-CM

## 2018-06-11 DIAGNOSIS — G893 Neoplasm related pain (acute) (chronic): Secondary | ICD-10-CM

## 2018-06-11 DIAGNOSIS — K7290 Hepatic failure, unspecified without coma: Secondary | ICD-10-CM

## 2018-06-11 DIAGNOSIS — Z170 Estrogen receptor positive status [ER+]: Secondary | ICD-10-CM

## 2018-06-11 DIAGNOSIS — E8809 Other disorders of plasma-protein metabolism, not elsewhere classified: Secondary | ICD-10-CM

## 2018-06-11 DIAGNOSIS — Z9012 Acquired absence of left breast and nipple: Secondary | ICD-10-CM

## 2018-06-13 DIAGNOSIS — F3341 Major depressive disorder, recurrent, in partial remission: Secondary | ICD-10-CM

## 2018-06-13 DIAGNOSIS — C787 Secondary malignant neoplasm of liver and intrahepatic bile duct: Secondary | ICD-10-CM

## 2018-06-13 DIAGNOSIS — K7290 Hepatic failure, unspecified without coma: Secondary | ICD-10-CM

## 2018-06-13 DIAGNOSIS — Z9221 Personal history of antineoplastic chemotherapy: Secondary | ICD-10-CM

## 2018-06-13 DIAGNOSIS — Z87891 Personal history of nicotine dependence: Secondary | ICD-10-CM

## 2018-06-13 DIAGNOSIS — G893 Neoplasm related pain (acute) (chronic): Secondary | ICD-10-CM

## 2018-06-13 DIAGNOSIS — Z9012 Acquired absence of left breast and nipple: Secondary | ICD-10-CM

## 2018-06-13 DIAGNOSIS — R188 Other ascites: Secondary | ICD-10-CM

## 2018-06-13 DIAGNOSIS — E8809 Other disorders of plasma-protein metabolism, not elsewhere classified: Secondary | ICD-10-CM

## 2018-06-13 DIAGNOSIS — I890 Lymphedema, not elsewhere classified: Secondary | ICD-10-CM

## 2018-06-13 DIAGNOSIS — R601 Generalized edema: Secondary | ICD-10-CM

## 2018-06-13 DIAGNOSIS — C7989 Secondary malignant neoplasm of other specified sites: Secondary | ICD-10-CM

## 2018-06-13 DIAGNOSIS — Z170 Estrogen receptor positive status [ER+]: Secondary | ICD-10-CM

## 2018-06-13 DIAGNOSIS — K766 Portal hypertension: Secondary | ICD-10-CM

## 2018-06-13 DIAGNOSIS — Z466 Encounter for fitting and adjustment of urinary device: Secondary | ICD-10-CM

## 2018-06-13 DIAGNOSIS — C50912 Malignant neoplasm of unspecified site of left female breast: Principal | ICD-10-CM

## 2018-06-13 DIAGNOSIS — J90 Pleural effusion, not elsewhere classified: Secondary | ICD-10-CM

## 2018-06-13 DIAGNOSIS — Z66 Do not resuscitate: Secondary | ICD-10-CM

## 2018-06-13 DIAGNOSIS — C7951 Secondary malignant neoplasm of bone: Secondary | ICD-10-CM

## 2018-06-20 DIAGNOSIS — Z9221 Personal history of antineoplastic chemotherapy: Secondary | ICD-10-CM

## 2018-06-20 DIAGNOSIS — K7290 Hepatic failure, unspecified without coma: Secondary | ICD-10-CM

## 2018-06-20 DIAGNOSIS — E8809 Other disorders of plasma-protein metabolism, not elsewhere classified: Secondary | ICD-10-CM

## 2018-06-20 DIAGNOSIS — G893 Neoplasm related pain (acute) (chronic): Secondary | ICD-10-CM

## 2018-06-20 DIAGNOSIS — Z87891 Personal history of nicotine dependence: Secondary | ICD-10-CM

## 2018-06-20 DIAGNOSIS — C7951 Secondary malignant neoplasm of bone: Secondary | ICD-10-CM

## 2018-06-20 DIAGNOSIS — R188 Other ascites: Secondary | ICD-10-CM

## 2018-06-20 DIAGNOSIS — K766 Portal hypertension: Secondary | ICD-10-CM

## 2018-06-20 DIAGNOSIS — Z66 Do not resuscitate: Secondary | ICD-10-CM

## 2018-06-20 DIAGNOSIS — C787 Secondary malignant neoplasm of liver and intrahepatic bile duct: Secondary | ICD-10-CM

## 2018-06-20 DIAGNOSIS — F3341 Major depressive disorder, recurrent, in partial remission: Secondary | ICD-10-CM

## 2018-06-20 DIAGNOSIS — R601 Generalized edema: Secondary | ICD-10-CM

## 2018-06-20 DIAGNOSIS — J90 Pleural effusion, not elsewhere classified: Secondary | ICD-10-CM

## 2018-06-20 DIAGNOSIS — Z9012 Acquired absence of left breast and nipple: Secondary | ICD-10-CM

## 2018-06-20 DIAGNOSIS — Z466 Encounter for fitting and adjustment of urinary device: Secondary | ICD-10-CM

## 2018-06-20 DIAGNOSIS — Z170 Estrogen receptor positive status [ER+]: Secondary | ICD-10-CM

## 2018-06-20 DIAGNOSIS — C7989 Secondary malignant neoplasm of other specified sites: Secondary | ICD-10-CM

## 2018-06-20 DIAGNOSIS — C50912 Malignant neoplasm of unspecified site of left female breast: Principal | ICD-10-CM

## 2018-06-20 DIAGNOSIS — I890 Lymphedema, not elsewhere classified: Secondary | ICD-10-CM

## 2018-06-21 ENCOUNTER — Telehealth: Payer: Self-pay | Admitting: Family Medicine

## 2018-06-21 NOTE — Telephone Encounter (Signed)
Pt's Gae Bon stated that pt passed away 03/07/23 12-Jun-2018 and wanted Dr. Caryn Section to know. Please advise. Thanks TNP

## 2018-07-05 DEATH — deceased

## 2019-08-29 IMAGING — US US ABDOMEN COMPLETE
1 series · 13 of 25 positions shown · non-contrast
Comparison: Abdominal ultrasound November 05, 2008 and abdominal
and pelvic CT scan May 16, 2017

CLINICAL DATA: Two weeks of abdominal pain. History of hepatic
malignancy and partial hepatectomy. History of breast malignancy.

EXAM:
ABDOMEN ULTRASOUND COMPLETE

[Series 1: us abdomen complete · 0.25mm/px · 13 of 99 slices shown]
[im 1/99]
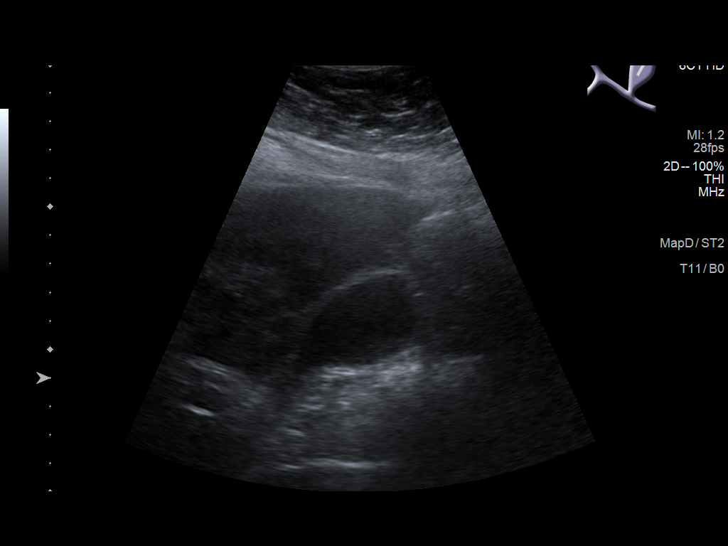
[im 9/99]
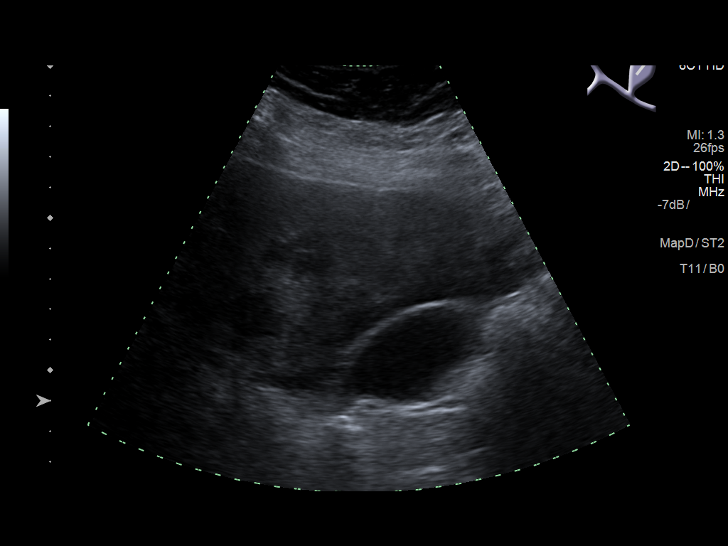
[im 17/99]
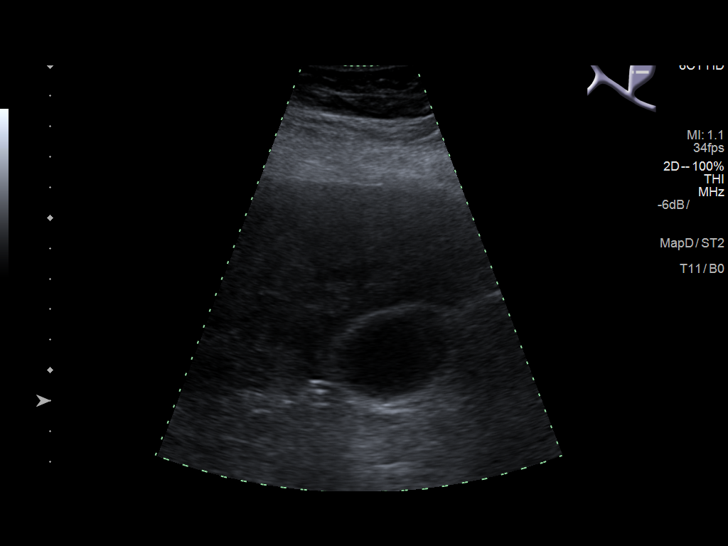
[im 25/99]
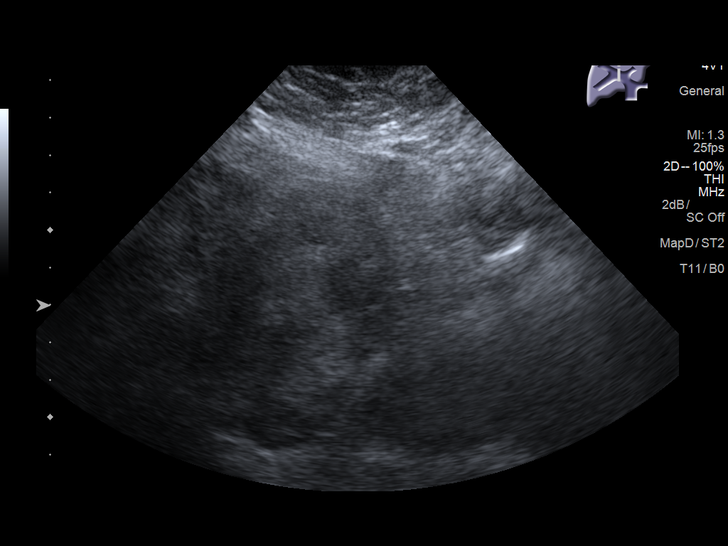
[im 33/99]
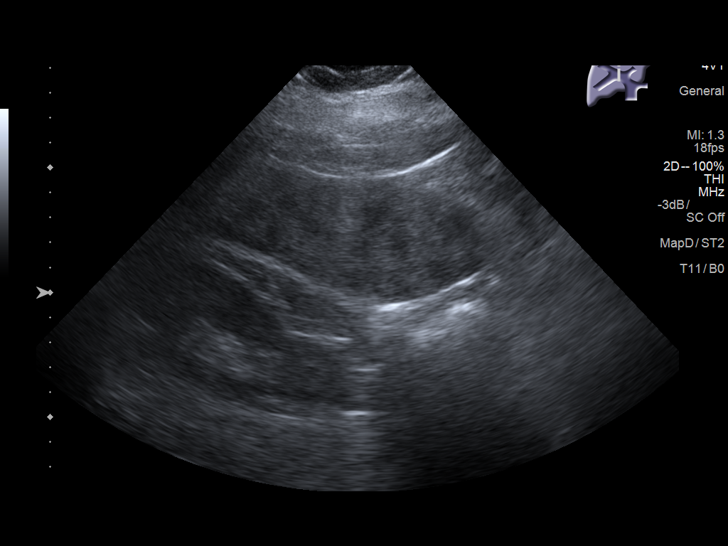
[im 41/99]
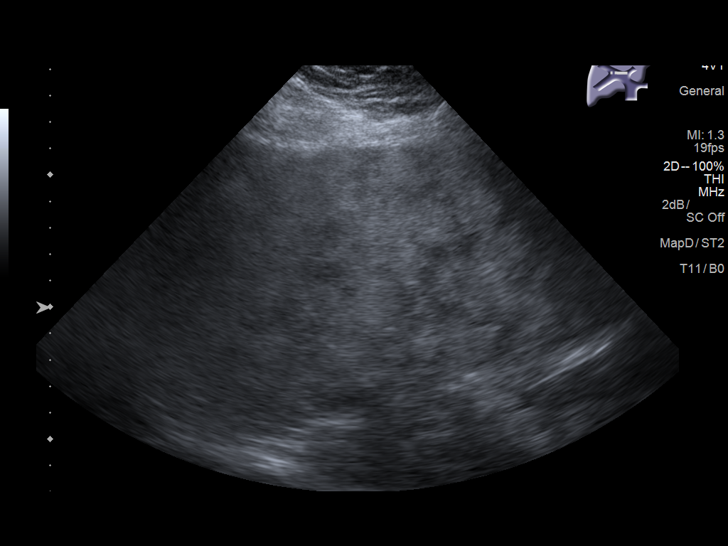
[im 50/99]
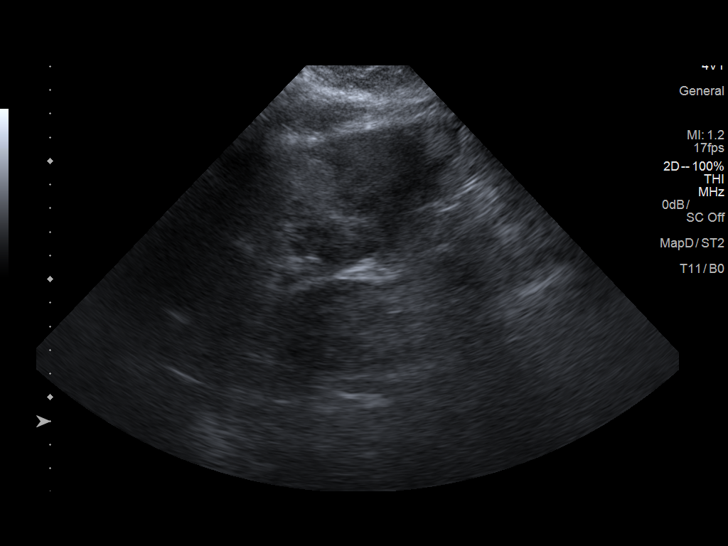
[im 58/99]
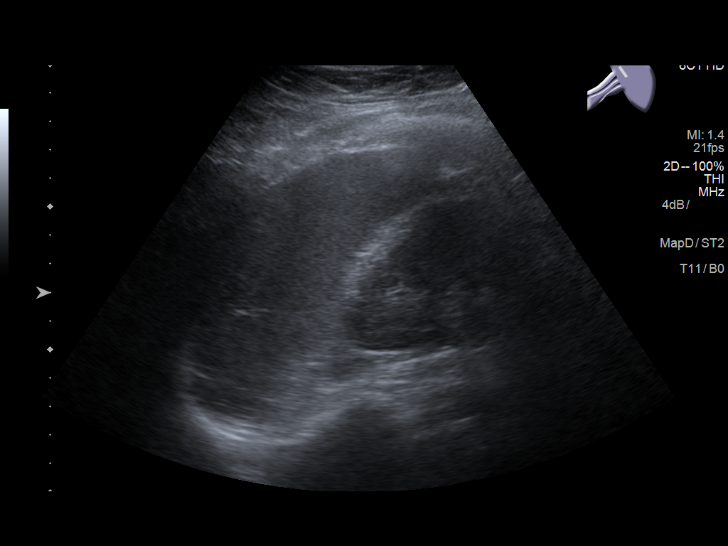
[im 66/99]
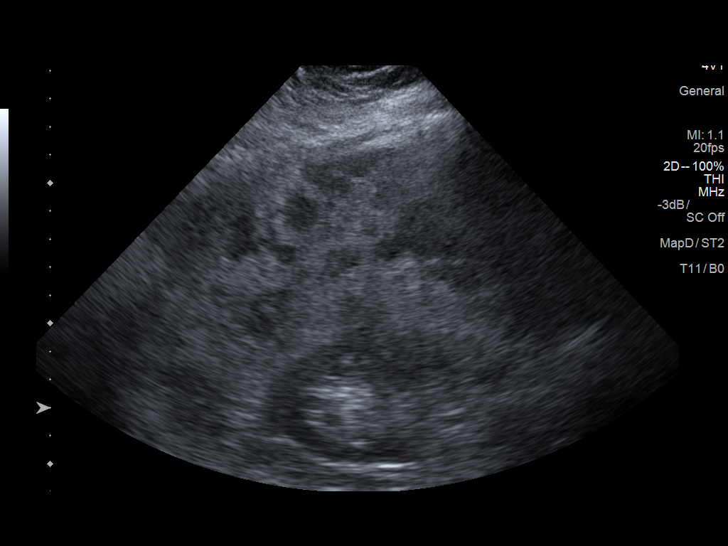
[im 74/99]
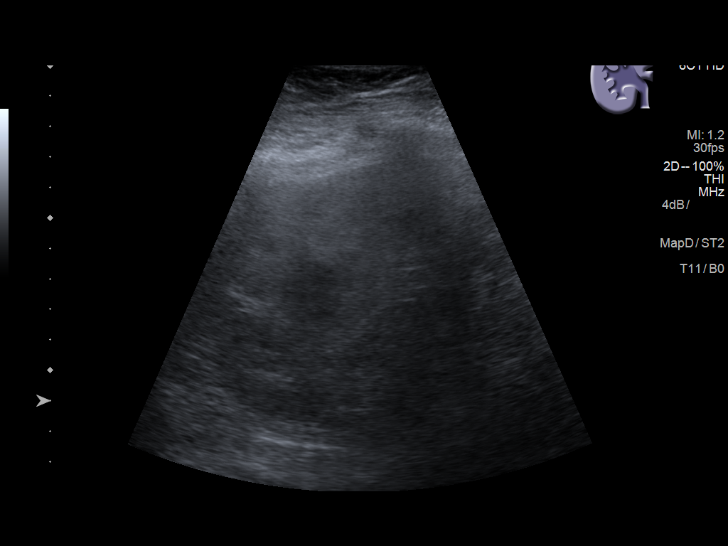
[im 82/99]
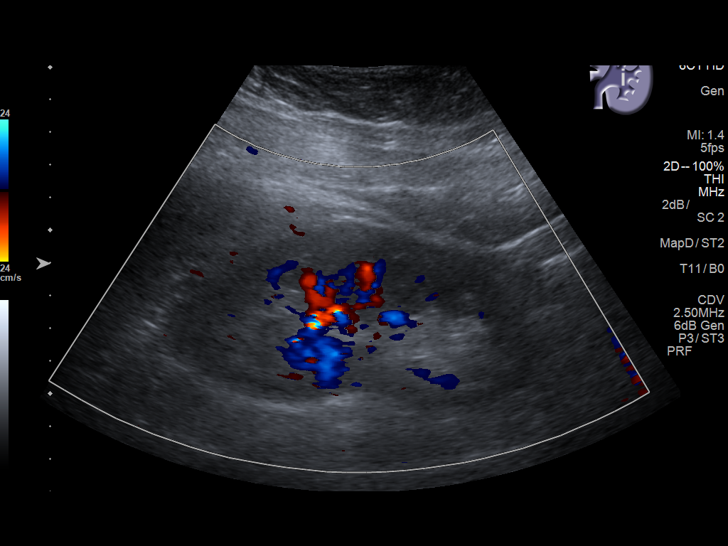
[im 90/99]
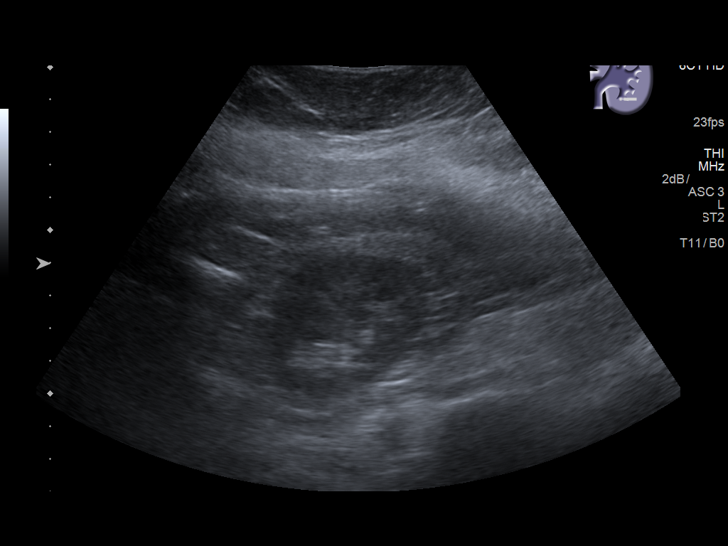
[im 99/99]
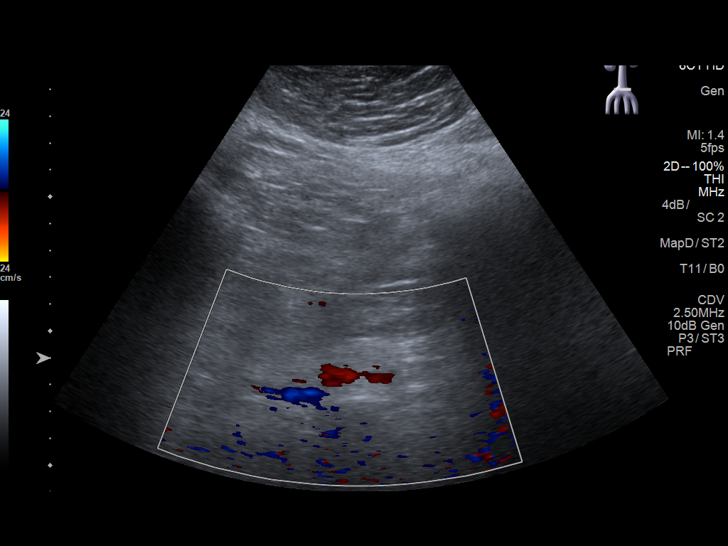

[13 of 25 positions shown; findings below may reference images not displayed]

FINDINGS: Gallbladder: No gallstones or wall thickening visualized. No
sonographic Murphy sign noted by sonographer.

Common bile duct: Diameter: 5 mm

Liver: The hepatic echotexture is markedly abnormal with numerous
rounded masses of varying sizes noted diffusely. The largest mass
measures 2.1 cm and is located in the right lobe. There is no
intrahepatic ductal dilation. Portal vein is patent on color Doppler
imaging with normal direction of blood flow towards the liver.

IVC: No abnormality visualized.

Pancreas: Bowel gas limits visualization of the pancreas.

Spleen: Size and appearance within normal limits.

Right Kidney: Length: 11.3 cm. Echogenicity within normal limits. No
mass or hydronephrosis visualized.

Left Kidney: Length: 11.5 cm. Echogenicity within normal limits. No
mass or hydronephrosis visualized.

Abdominal aorta: No aneurysm visualized.

Other findings: There is no ascites.
IMPRESSION: Markedly abnormal appearance of the liver worrisome for widespread
metastatic disease. No ascites or splenomegaly. Hepatic protocol MRI
is recommended.

No acute abnormality observed elsewhere within the abdomen.
